# Patient Record
Sex: Female | Born: 1980 | Race: White | Hispanic: No | Smoking: Former smoker
Health system: Southern US, Community
[De-identification: ages and names within clinical notes are randomized; demographics above are authoritative.]

## PROBLEM LIST (undated history)

## (undated) DIAGNOSIS — I10 Essential (primary) hypertension: Secondary | ICD-10-CM

## (undated) DIAGNOSIS — D72829 Elevated white blood cell count, unspecified: Secondary | ICD-10-CM

## (undated) DIAGNOSIS — N200 Calculus of kidney: Secondary | ICD-10-CM

## (undated) DIAGNOSIS — G43909 Migraine, unspecified, not intractable, without status migrainosus: Secondary | ICD-10-CM

## (undated) HISTORY — DX: Migraine, unspecified, not intractable, without status migrainosus: G43.909

## (undated) HISTORY — PX: DILATION AND CURETTAGE OF UTERUS: SHX78

---
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
## 2005-06-25 ENCOUNTER — Ambulatory Visit: Payer: Self-pay

## 2005-06-28 ENCOUNTER — Emergency Department: Payer: Self-pay | Admitting: Unknown Physician Specialty

## 2005-08-10 ENCOUNTER — Emergency Department: Payer: Self-pay | Admitting: Emergency Medicine

## 2005-11-09 ENCOUNTER — Observation Stay: Payer: Self-pay | Admitting: Obstetrics & Gynecology

## 2005-11-10 HISTORY — PX: TUBAL LIGATION: SHX77

## 2006-01-07 ENCOUNTER — Observation Stay: Payer: Self-pay

## 2006-01-08 ENCOUNTER — Ambulatory Visit: Payer: Self-pay

## 2006-01-09 ENCOUNTER — Observation Stay: Payer: Self-pay

## 2006-01-11 ENCOUNTER — Observation Stay: Payer: Self-pay | Admitting: Unknown Physician Specialty

## 2006-01-19 ENCOUNTER — Observation Stay: Payer: Self-pay

## 2006-01-25 ENCOUNTER — Observation Stay: Payer: Self-pay

## 2006-02-10 ENCOUNTER — Emergency Department: Payer: Self-pay | Admitting: Emergency Medicine

## 2006-02-12 ENCOUNTER — Observation Stay: Payer: Self-pay | Admitting: Obstetrics & Gynecology

## 2006-02-16 ENCOUNTER — Observation Stay: Payer: Self-pay

## 2006-02-17 ENCOUNTER — Observation Stay: Payer: Self-pay | Admitting: Unknown Physician Specialty

## 2006-02-20 ENCOUNTER — Observation Stay: Payer: Self-pay

## 2006-02-25 ENCOUNTER — Inpatient Hospital Stay: Payer: Self-pay

## 2006-04-22 ENCOUNTER — Emergency Department: Payer: Self-pay | Admitting: Unknown Physician Specialty

## 2007-10-10 ENCOUNTER — Emergency Department: Payer: Self-pay | Admitting: Emergency Medicine

## 2008-07-21 ENCOUNTER — Ambulatory Visit: Payer: Self-pay | Admitting: Family Medicine

## 2010-06-24 ENCOUNTER — Emergency Department: Payer: Self-pay | Admitting: Unknown Physician Specialty

## 2010-07-17 ENCOUNTER — Ambulatory Visit: Payer: Self-pay | Admitting: Family Medicine

## 2010-08-15 ENCOUNTER — Emergency Department: Payer: Self-pay | Admitting: Unknown Physician Specialty

## 2010-08-16 ENCOUNTER — Ambulatory Visit: Payer: Self-pay | Admitting: Internal Medicine

## 2011-05-06 ENCOUNTER — Emergency Department: Payer: Self-pay | Admitting: Emergency Medicine

## 2011-05-20 ENCOUNTER — Ambulatory Visit: Payer: Self-pay | Admitting: Internal Medicine

## 2011-07-15 ENCOUNTER — Ambulatory Visit: Payer: Self-pay

## 2011-09-18 ENCOUNTER — Emergency Department: Payer: Self-pay | Admitting: *Deleted

## 2012-03-26 ENCOUNTER — Emergency Department: Payer: Self-pay | Admitting: *Deleted

## 2013-02-01 ENCOUNTER — Emergency Department: Payer: Self-pay | Admitting: Emergency Medicine

## 2013-03-16 ENCOUNTER — Ambulatory Visit: Payer: Self-pay | Admitting: Internal Medicine

## 2013-06-21 ENCOUNTER — Emergency Department: Payer: Self-pay | Admitting: Emergency Medicine

## 2014-04-05 ENCOUNTER — Ambulatory Visit: Payer: Self-pay | Admitting: Internal Medicine

## 2014-07-01 ENCOUNTER — Ambulatory Visit: Payer: Self-pay | Admitting: Physician Assistant

## 2014-07-01 ENCOUNTER — Emergency Department: Payer: Self-pay | Admitting: Emergency Medicine

## 2014-07-01 LAB — CBC WITH DIFFERENTIAL/PLATELET
BASOS ABS: 0.1 10*3/uL (ref 0.0–0.1)
Basophil %: 0.5 %
EOS ABS: 0.2 10*3/uL (ref 0.0–0.7)
Eosinophil %: 1.6 %
HCT: 44 % (ref 35.0–47.0)
HGB: 14.7 g/dL (ref 12.0–16.0)
Lymphocyte #: 2.5 10*3/uL (ref 1.0–3.6)
Lymphocyte %: 20.4 %
MCH: 30.3 pg (ref 26.0–34.0)
MCHC: 33.3 g/dL (ref 32.0–36.0)
MCV: 91 fL (ref 80–100)
MONOS PCT: 7.8 %
Monocyte #: 1 x10 3/mm — ABNORMAL HIGH (ref 0.2–0.9)
NEUTROS PCT: 69.7 %
Neutrophil #: 8.6 10*3/uL — ABNORMAL HIGH (ref 1.4–6.5)
PLATELETS: 159 10*3/uL (ref 150–440)
RBC: 4.83 10*6/uL (ref 3.80–5.20)
RDW: 13.7 % (ref 11.5–14.5)
WBC: 12.4 10*3/uL — ABNORMAL HIGH (ref 3.6–11.0)

## 2014-07-01 LAB — URINALYSIS, COMPLETE
BLOOD: NEGATIVE
Bilirubin,UR: NEGATIVE
Glucose,UR: NEGATIVE mg/dL (ref 0–75)
Ketone: NEGATIVE
Nitrite: NEGATIVE
Ph: 6 (ref 4.5–8.0)
Protein: NEGATIVE
RBC, UR: NONE SEEN /HPF (ref 0–5)
SPECIFIC GRAVITY: 1.013 (ref 1.003–1.030)
WBC UR: 2 /HPF (ref 0–5)

## 2014-07-01 LAB — COMPREHENSIVE METABOLIC PANEL
AST: 21 U/L (ref 15–37)
Albumin: 3.5 g/dL (ref 3.4–5.0)
Alkaline Phosphatase: 68 U/L
Anion Gap: 8 (ref 7–16)
BILIRUBIN TOTAL: 0.6 mg/dL (ref 0.2–1.0)
BUN: 9 mg/dL (ref 7–18)
CO2: 24 mmol/L (ref 21–32)
Calcium, Total: 8.9 mg/dL (ref 8.5–10.1)
Chloride: 109 mmol/L — ABNORMAL HIGH (ref 98–107)
Creatinine: 0.82 mg/dL (ref 0.60–1.30)
EGFR (African American): >60
GLUCOSE: 107 mg/dL — AB (ref 65–99)
Osmolality: 280 (ref 275–301)
Potassium: 4 mmol/L (ref 3.5–5.1)
SGPT (ALT): 17 U/L
SODIUM: 141 mmol/L (ref 136–145)
Total Protein: 6.8 g/dL (ref 6.4–8.2)

## 2014-07-01 LAB — GC/CHLAMYDIA PROBE AMP

## 2014-07-01 LAB — WET PREP, GENITAL

## 2014-07-05 ENCOUNTER — Ambulatory Visit: Payer: Self-pay | Admitting: Internal Medicine

## 2014-07-06 ENCOUNTER — Ambulatory Visit: Payer: Self-pay | Admitting: Internal Medicine

## 2014-10-31 ENCOUNTER — Ambulatory Visit: Payer: Self-pay

## 2015-02-01 DIAGNOSIS — R519 Headache, unspecified: Secondary | ICD-10-CM | POA: Insufficient documentation

## 2015-02-01 DIAGNOSIS — R51 Headache: Secondary | ICD-10-CM

## 2015-02-20 ENCOUNTER — Emergency Department
Admit: 2015-02-20 | Disposition: A | Discharge status: Home / Self Care | Payer: Self-pay | Admitting: Emergency Medicine

## 2015-03-03 ENCOUNTER — Emergency Department
Admit: 2015-03-03 | Disposition: A | Discharge status: Home / Self Care | Payer: Self-pay | Admitting: Emergency Medicine

## 2015-03-03 LAB — COMPREHENSIVE METABOLIC PANEL
ALBUMIN: 4.1 g/dL
ALK PHOS: 72 U/L
ANION GAP: 9 (ref 7–16)
AST: 23 U/L
BUN: 11 mg/dL
Bilirubin,Total: 0.4 mg/dL
CHLORIDE: 106 mmol/L
Calcium, Total: 9 mg/dL
Co2: 23 mmol/L
Creatinine: 0.81 mg/dL
EGFR (Non-African Amer.): >60
Glucose: 137 mg/dL — ABNORMAL HIGH
POTASSIUM: 3.6 mmol/L
SGPT (ALT): 16 U/L
SODIUM: 138 mmol/L
TOTAL PROTEIN: 7.2 g/dL

## 2015-03-03 LAB — URINALYSIS, COMPLETE
Bilirubin,UR: NEGATIVE
GLUCOSE, UR: NEGATIVE mg/dL (ref 0–75)
NITRITE: NEGATIVE
Ph: 8 (ref 4.5–8.0)
Protein: NEGATIVE
Specific Gravity: 1.01 (ref 1.003–1.030)

## 2015-03-03 LAB — CBC WITH DIFFERENTIAL/PLATELET
BASOS ABS: 0 10*3/uL (ref 0.0–0.1)
Basophil %: 0.3 %
EOS PCT: 1.1 %
Eosinophil #: 0.2 10*3/uL (ref 0.0–0.7)
HCT: 43.5 % (ref 35.0–47.0)
HGB: 14.7 g/dL (ref 12.0–16.0)
LYMPHS PCT: 14.7 %
Lymphocyte #: 2 10*3/uL (ref 1.0–3.6)
MCH: 29.6 pg (ref 26.0–34.0)
MCHC: 33.7 g/dL (ref 32.0–36.0)
MCV: 88 fL (ref 80–100)
Monocyte #: 0.8 x10 3/mm (ref 0.2–0.9)
Monocyte %: 5.6 %
NEUTROS ABS: 10.7 10*3/uL — AB (ref 1.4–6.5)
NEUTROS PCT: 78.3 %
Platelet: 186 10*3/uL (ref 150–440)
RBC: 4.97 10*6/uL (ref 3.80–5.20)
RDW: 13.8 % (ref 11.5–14.5)
WBC: 13.6 10*3/uL — ABNORMAL HIGH (ref 3.6–11.0)

## 2015-03-03 LAB — TROPONIN I: Troponin-I: <0.03 ng/mL

## 2015-03-03 LAB — LIPASE, BLOOD: Lipase: 30 U/L

## 2015-03-06 ENCOUNTER — Ambulatory Visit
Admit: 2015-03-06 | Disposition: A | Discharge status: Home / Self Care | Payer: Self-pay | Attending: Urology | Admitting: Urology

## 2015-03-08 ENCOUNTER — Ambulatory Visit
Admit: 2015-03-08 | Disposition: A | Discharge status: Home / Self Care | Payer: Self-pay | Attending: Urology | Admitting: Urology

## 2015-03-31 ENCOUNTER — Emergency Department
Admission: EM | Admit: 2015-03-31 | Discharge: 2015-03-31 | Disposition: A | Discharge status: Home / Self Care | Payer: Self-pay | Attending: Emergency Medicine | Admitting: Emergency Medicine

## 2015-03-31 DIAGNOSIS — H6012 Cellulitis of left external ear: Secondary | ICD-10-CM | POA: Insufficient documentation

## 2015-03-31 MED ORDER — AMOXICILLIN-POT CLAVULANATE 200-28.5 MG PO CHEW: 1.0000 | CHEWABLE_TABLET | Freq: Two times a day (BID) | ORAL | Status: AC

## 2015-03-31 MED ORDER — AMOXICILLIN-POT CLAVULANATE 875-125 MG PO TABS
1.0000 | ORAL_TABLET | Freq: Once | ORAL | Status: AC
  Administered 2015-03-31: 1 via ORAL

## 2015-03-31 MED ORDER — AMOXICILLIN-POT CLAVULANATE 875-125 MG PO TABS
ORAL_TABLET | ORAL | Status: AC
  Administered 2015-03-31: 1 via ORAL
  Filled 2015-03-31: qty 1

## 2015-03-31 NOTE — ED Provider Notes (Signed)
CSN: [id number]     Arrival date & time 03/31/15  1922 History   First MD Initiated Contact with Patient 03/31/15 2235     Chief Complaint  Patient presents with  . Foreign Body in Ear     (Consider location/radiation/quality/duration/timing/severity/associated sxs/prior Treatment) HPI   34 year old female underwent industrial piercing to the left ear along the superior pinna 9 days ago. Patient has developed worsening redness, warmth, pain to the left ear pinna. She denies any drainage. She denies any fevers. Pain is moderate and increased with touch. It is sharp and no numbness or tingling. She has tried Tylenol and ibuprofen with minimal pain relief. Patient denies any itching or rash  No past medical history on file. No past surgical history on file. No family history on file. History  Substance Use Topics  . Smoking status: Not on file  . Smokeless tobacco: Not on file  . Alcohol Use: Not on file   OB History    No data available     Review of Systems  Constitutional: Negative for fever, chills, activity change and fatigue.  HENT: Negative for congestion, sinus pressure and sore throat.   Eyes: Negative for visual disturbance.  Respiratory: Negative for cough, chest tightness and shortness of breath.   Cardiovascular: Negative for chest pain and leg swelling.  Gastrointestinal: Negative for nausea, vomiting, abdominal pain and diarrhea.  Genitourinary: Negative for dysuria.  Musculoskeletal: Negative for arthralgias and gait problem.  Skin: Positive for color change. Negative for rash and wound.  Neurological: Negative for weakness, numbness and headaches.  Hematological: Negative for adenopathy.  Psychiatric/Behavioral: Negative for behavioral problems, confusion and agitation.      Allergies  Review of patient's allergies indicates no known allergies.  Home Medications   Prior to Admission medications   Medication Sig Start Date End Date Taking? Authorizing  Provider  amoxicillin-clavulanate (AUGMENTIN) 200-28.5 MG per chewable tablet Chew 1 tablet by mouth 2 (two) times daily. 03/31/15 04/10/15  Evon Slack, PA-C   BP 158/97 mmHg  Pulse 91  Temp(Src) 98 F (36.7 C) (Oral)  Ht 5' (1.524 m)  Wt 166 lb 6.4 oz (75.479 kg)  BMI 32.50 kg/m2  SpO2 99% Physical Exam  Constitutional: She is oriented to person, place, and time. She appears well-developed and well-nourished. No distress.  HENT:  Head: Normocephalic and atraumatic.  Mouth/Throat: Oropharynx is clear and moist.  Eyes: EOM are normal. Pupils are equal, round, and reactive to light. Right eye exhibits no discharge. Left eye exhibits no discharge.  Neck: Normal range of motion. Neck supple.  Cardiovascular: Normal rate, regular rhythm and intact distal pulses.   Pulmonary/Chest: Effort normal and breath sounds normal. No respiratory distress. She exhibits no tenderness.  Abdominal: Soft. She exhibits no distension. There is no tenderness.  Musculoskeletal: Normal range of motion. She exhibits no edema.  Neurological: She is alert and oriented to person, place, and time. She has normal reflexes.  Skin: Skin is warm and dry. There is erythema (to the superior aspect of the left ear along the pinna. There is no drainage noted. Industrial ear ring is intact.no fluctuance is noted.Marland Kitchen).  Psychiatric: She has a normal mood and affect. Her behavior is normal. Thought content normal.    ED Course  Procedures (including critical care time) Labs Review Labs Reviewed - No data to display  Imaging Review No results found.   EKG Interpretation None      MDM   Final diagnoses:  Cellulitis of ear,  left    34 year old female with worsening erythema and warmth to the left superior ear along a recent ear piercing. There is been no drainage noted. Patient will be started with Augmentin. She will follow-up with ENT in 3-5 days if no improvement with antibiotics.    Evon Slackhomas C Janielle Mittelstadt,  PA-C 03/31/15 2255  Evon Slackhomas C Manya Balash, PA-C 03/31/15 2258  Myrna Blazeravid Matthew Schaevitz, MD 04/01/15 [phone]

## 2015-03-31 NOTE — ED Notes (Signed)
Pt had piercing to outer right ear 9 days ago-an "industrial bar"; now with pain and mild swelling to area; tender to touch; concerned of infection

## 2015-03-31 NOTE — Discharge Instructions (Signed)

## 2015-03-31 NOTE — ED Notes (Signed)
Pt reports having industrial piercing to left ear 9 days ago and since then has had intense pain, n/v, and low grade fever.  Left ear visibly swollen.  Pt reports yellow drainage and pain radiating to neck and down jaw to eardrum.  Pt reports being seen by another piercer and told bar was going through vein and pt needs to be seen in ED.  Pt NAD at this time.

## 2015-06-04 ENCOUNTER — Encounter: Payer: Self-pay | Admitting: Internal Medicine

## 2015-06-04 DIAGNOSIS — N200 Calculus of kidney: Secondary | ICD-10-CM

## 2015-06-04 DIAGNOSIS — G43009 Migraine without aura, not intractable, without status migrainosus: Secondary | ICD-10-CM | POA: Insufficient documentation

## 2015-06-04 DIAGNOSIS — N946 Dysmenorrhea, unspecified: Secondary | ICD-10-CM | POA: Insufficient documentation

## 2015-06-04 DIAGNOSIS — Z87442 Personal history of urinary calculi: Secondary | ICD-10-CM | POA: Insufficient documentation

## 2015-06-04 DIAGNOSIS — R030 Elevated blood-pressure reading, without diagnosis of hypertension: Secondary | ICD-10-CM | POA: Insufficient documentation

## 2015-06-05 ENCOUNTER — Ambulatory Visit: Payer: Self-pay | Admitting: Internal Medicine

## 2015-06-25 ENCOUNTER — Other Ambulatory Visit: Payer: Self-pay

## 2015-06-26 MED ORDER — TRAMADOL HCL 50 MG PO TABS: 50.0000 mg | ORAL_TABLET | Freq: Two times a day (BID) | ORAL | Status: DC | PRN

## 2015-07-20 ENCOUNTER — Ambulatory Visit: Payer: Self-pay | Admitting: Internal Medicine

## 2015-08-02 ENCOUNTER — Ambulatory Visit: Payer: Self-pay | Admitting: Internal Medicine

## 2015-08-27 ENCOUNTER — Encounter: Payer: Self-pay | Admitting: Internal Medicine

## 2015-08-27 ENCOUNTER — Ambulatory Visit
Admission: RE | Admit: 2015-08-27 | Discharge: 2015-08-27 | Disposition: A | Discharge status: Home / Self Care | Payer: BLUE CROSS/BLUE SHIELD | Source: Ambulatory Visit | Attending: Internal Medicine | Admitting: Internal Medicine

## 2015-08-27 ENCOUNTER — Ambulatory Visit (INDEPENDENT_AMBULATORY_CARE_PROVIDER_SITE_OTHER): Payer: BLUE CROSS/BLUE SHIELD | Admitting: Internal Medicine

## 2015-08-27 VITALS — BP 120/88 | HR 88 | Ht 60.0 in | Wt 163.0 lb

## 2015-08-27 DIAGNOSIS — Z23 Encounter for immunization: Secondary | ICD-10-CM

## 2015-08-27 DIAGNOSIS — M79671 Pain in right foot: Secondary | ICD-10-CM | POA: Diagnosis not present

## 2015-08-27 MED ORDER — TRAMADOL HCL 50 MG PO TABS: 50.0000 mg | ORAL_TABLET | Freq: Four times a day (QID) | ORAL | Status: DC | PRN

## 2015-08-27 NOTE — Patient Instructions (Signed)
Continue elevation, limited weight bearing and take advil or tramadol as needed for pain every 6 hours.

## 2015-08-27 NOTE — Progress Notes (Signed)
Date:  08/27/2015   Name:  Amy Gomez   DOB:  03/14/1981   MRN:  [id number]   Chief Complaint: Foot Pain Right foot pain - twisted yesterday after tripping over a new puppy.  She did not hear a pop but it hurt immediately.  She propped it up and use ice.  It is very painful - not improving.  Now swollen and warm.  More painful with ambulation.  She is able to move her toes slightly. She has not taken ibuprofen or tramadol that she has for headache.   Review of Systems  Constitutional: Negative for chills and fatigue.  Respiratory: Negative for chest tightness and shortness of breath.   Cardiovascular: Negative for chest pain.  Musculoskeletal: Positive for joint swelling, arthralgias and gait problem.    Patient Active Problem List   Diagnosis Date Noted  . 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 Dysmenorrhea 06/04/2015  . Blood pressure elevated 06/04/2015  . Migraine without aura and responsive to treatment 06/04/2015  . 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 Authorizing Provider  clindamycin (CLEOCIN T) 1 % external solution CLINDAMYCIN PHOSPHATE, 1% (External Solution)  1 (one) application application twice daily to clean, dry face,cheeks and chin for 30 days  Quantity: 30;  Refills: 11   Ordered :30-May-2014  Bari EdwardBERGLUND, Orine Goga M.D.;  Started 30-May-2014 Active 05/30/14  Yes Historical Provider, MD  traMADol (ULTRAM) 50 MG tablet Take 1 tablet (50 mg total) by mouth 2 (two) times daily as needed. 06/26/15   Reubin MilanLaura H Brynleigh Sequeira, MD    Allergies  Allergen Reactions  . Codeine Other (See Comments)  . Sulfa Antibiotics Rash    Past Surgical History  Procedure Laterality Date  . Tubal ligation Bilateral 2007    Social History  Substance Use Topics  . Smoking status: Current Every Day Smoker  . Smokeless tobacco: None  . Alcohol Use: No    Medication list has been reviewed and updated.   Physical  Exam  Constitutional: She appears well-developed and well-nourished. No distress.  Cardiovascular: Normal rate and regular rhythm.   Pulmonary/Chest: Effort normal and breath sounds normal.  Musculoskeletal:       Right ankle: She exhibits decreased range of motion, swelling and ecchymosis. She exhibits normal pulse. Tenderness. No lateral malleolus and no medial malleolus tenderness found.  Nursing note and vitals reviewed.   BP 120/88 mmHg  Pulse 88  Ht 5' (1.524 m)  Wt 163 lb (73.936 kg)  BMI 31.83 kg/m2  Assessment and Plan: 1. Acute foot pain, right Suspect fracture Take ibuprofen or tramadol every 6 hours as needed Elevated and avoid weightbearing as possible - DG Foot Complete Right; Future  2. Need for influenza vaccination - Flu Vaccine QUAD 36+ mos IM   Bari EdwardLaura Moishe Schellenberg, MD [facility]  08/27/2015

## 2015-08-29 ENCOUNTER — Telehealth: Payer: Self-pay | Admitting: Obstetrics and Gynecology

## 2015-08-29 NOTE — Telephone Encounter (Signed)
Called pt we discussed her findings, advised pt that sometimes we can produce more mucous she voiced understanding

## 2015-08-29 NOTE — Telephone Encounter (Signed)
Pt has her tubes tided, she just got off her period a week and half ago and just went to the bathroom and glob of stuff that she said looks like a mucus plug came out. Does she need to be seen. She said she is freaked out.

## 2015-09-26 ENCOUNTER — Telehealth: Payer: Self-pay

## 2015-09-26 MED ORDER — FLUCONAZOLE 100 MG PO TABS: 100.0000 mg | ORAL_TABLET | Freq: Every day | ORAL | Status: DC

## 2015-09-26 NOTE — Telephone Encounter (Signed)
Patient needing Diflucan for yeast infection, antibiotic from dentist, used OTC Monistat did not work. dr

## 2016-09-03 ENCOUNTER — Other Ambulatory Visit: Payer: Self-pay

## 2016-09-03 ENCOUNTER — Other Ambulatory Visit: Payer: Self-pay | Admitting: Internal Medicine

## 2016-09-03 ENCOUNTER — Ambulatory Visit (INDEPENDENT_AMBULATORY_CARE_PROVIDER_SITE_OTHER): Payer: BLUE CROSS/BLUE SHIELD | Admitting: Internal Medicine

## 2016-09-03 ENCOUNTER — Encounter: Payer: Self-pay | Admitting: Internal Medicine

## 2016-09-03 VITALS — BP 128/84 | HR 83 | Resp 16 | Ht 60.0 in | Wt 169.0 lb

## 2016-09-03 DIAGNOSIS — G43009 Migraine without aura, not intractable, without status migrainosus: Secondary | ICD-10-CM | POA: Diagnosis not present

## 2016-09-03 MED ORDER — TOPIRAMATE 25 MG PO TABS: 100.0000 mg | ORAL_TABLET | Freq: Every day | ORAL | 2 refills | Status: DC

## 2016-09-03 NOTE — Patient Instructions (Signed)
Started 25 mg at bedtime for one week, then increase by 25 mg per week until benefit or maximum dose of 100 mg.

## 2016-09-03 NOTE — Progress Notes (Signed)
Date:  09/03/2016   Name:  Amy Gomez   DOB:  06-15-1981   MRN:  [id number]   Chief Complaint: Migraine (Would like to try Topimax again. Had depression in past on them but wants to see if this has changed since there are more family around who can help. Migraines 3 times a week for past 90 days avg. )  Migraine   This is a chronic problem. The problem occurs daily. The problem has been unchanged. The pain is moderate. Associated symptoms include nausea, photophobia and vomiting. Pertinent negatives include no fever. The symptoms are aggravated by menstrual cycle. She has tried NSAIDs (topamax) for the symptoms. Her past medical history is significant for migraine headaches.  She things that stress is contributing to her issues as well.  Currently taking advil and sleeping but this does not completely aleve the headache.   Review of Systems  Constitutional: Negative for diaphoresis and fever.  Eyes: Positive for photophobia.  Respiratory: Negative for chest tightness and shortness of breath.   Cardiovascular: Negative for chest pain, palpitations and leg swelling.  Gastrointestinal: Positive for nausea and vomiting.  Musculoskeletal: Negative for arthralgias.  Skin: Negative for rash.  Neurological: Positive for headaches.  Psychiatric/Behavioral: Positive for dysphoric mood. Negative for sleep disturbance.    Patient Active Problem List   Diagnosis Date Noted  . 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 Dysmenorrhea 06/04/2015  . Blood pressure elevated 06/04/2015  . Migraine without aura and responsive to treatment 06/04/2015  . 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 Authorizing Provider  traMADol (ULTRAM) 50 MG tablet Take 1 tablet (50 mg total) by mouth every 6 (six) hours as needed. Patient not taking: Reported on 09/03/2016 08/27/15   Reubin Milan, MD    Allergies  Allergen Reactions  . Codeine  Other (See Comments)  . Sulfa Antibiotics Rash    Past Surgical History:  Procedure Laterality Date  . TUBAL LIGATION Bilateral 2007    Social History  Substance Use Topics  . Smoking status: Current Every Day Smoker  . Smokeless tobacco: Never Used  . Alcohol use No     Medication list has been reviewed and updated.   Physical Exam  Constitutional: She is oriented to person, place, and time. She appears well-developed. No distress.  HENT:  Head: Normocephalic and atraumatic.  Neck: Normal range of motion. Neck supple. No thyromegaly present.  Cardiovascular: Normal rate, regular rhythm and normal heart sounds.   Pulmonary/Chest: Effort normal and breath sounds normal. No respiratory distress. She has no wheezes.  Musculoskeletal: Normal range of motion. She exhibits no edema or tenderness.  Neurological: She is alert and oriented to person, place, and time.  Skin: Skin is warm and dry. No rash noted.  Psychiatric: She has a normal mood and affect. Her behavior is normal. Thought content normal.  Nursing note and vitals reviewed.   BP 128/84   Pulse 83   Resp 16   Ht 5' (1.524 m)   Wt 169 lb (76.7 kg)   LMP 08/05/2016 (Approximate)   SpO2 98%   BMI 33.01 kg/m   Assessment and Plan: 1. Migraine without aura and responsive to treatment Will resume topamax - titrate up to 100 mg max Follow up if needed - topiramate (TOPAMAX) 25 MG tablet; Take 4 tablets (100 mg total) by mouth at bedtime.  Dispense: 120 tablet; Refill: 2   Bari Edward,  MD [facility]  09/03/2016

## 2016-09-04 ENCOUNTER — Ambulatory Visit (INDEPENDENT_AMBULATORY_CARE_PROVIDER_SITE_OTHER): Payer: BLUE CROSS/BLUE SHIELD

## 2016-09-04 DIAGNOSIS — Z23 Encounter for immunization: Secondary | ICD-10-CM | POA: Diagnosis not present

## 2016-09-29 ENCOUNTER — Ambulatory Visit
Admission: EM | Admit: 2016-09-29 | Discharge: 2016-09-29 | Disposition: A | Discharge status: Home / Self Care | Payer: BLUE CROSS/BLUE SHIELD | Attending: Family Medicine | Admitting: Family Medicine

## 2016-09-29 DIAGNOSIS — L03116 Cellulitis of left lower limb: Secondary | ICD-10-CM | POA: Diagnosis not present

## 2016-09-29 DIAGNOSIS — L02416 Cutaneous abscess of left lower limb: Secondary | ICD-10-CM | POA: Diagnosis not present

## 2016-09-29 MED ORDER — TRAMADOL HCL 50 MG PO TABS: 50.0000 mg | ORAL_TABLET | Freq: Three times a day (TID) | ORAL | 0 refills | Status: DC | PRN

## 2016-09-29 MED ORDER — DOXYCYCLINE HYCLATE 100 MG PO CAPS: 100.0000 mg | ORAL_CAPSULE | Freq: Two times a day (BID) | ORAL | 0 refills | Status: DC

## 2016-09-29 NOTE — ED Provider Notes (Signed)
[facility] ____________________________________________  Time seen: Approximately 0820 AM  I have reviewed the triage vital signs and the nursing notes.   HISTORY  Chief Complaint Cyst   HPI Amy Gomez is a 35 y.o. female presents for complaints of abscess to inner left leg. Patient states that approximately 3 weeks ago she believes that she had a small ingrown hair area and patient states the area that improved. Patient reports the area returned 3 days ago. Patient states that she did stick a needle in the area and drained some purulent fluid out. Patient states since then it has quickly increased in size. Patient reports areas mild to moderately tender. Denies any active drainage. Patient states this morning when she woke up the redness surrounding it had completely increased. Denies any other skin changes, swelling or pain. Denies fevers. Reports continues to eat and drink well. Denies any urinary or bowel changes.  Denies any history of similar. Denies history of MRSA. Patient reports that her daughter did have a history of similar in the past. Patient reports that she is questionably allergic to sulfa. Denies any other antibiotic allergies. Patient reports that she feels well otherwise.  [name], MD: PCP Patient's last menstrual period was 09/21/2016. Denies pregnancy.   Past Medical History:  Diagnosis Date  . Migraine     Patient Active Problem List   Diagnosis Date Noted  . 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 Dysmenorrhea 06/04/2015  . Blood pressure elevated 06/04/2015  . Migraine without aura and responsive to treatment 06/04/2015  . 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 TUBAL LIGATION Bilateral 2007    Current Outpatient Rx  . Order #: [id number] Class: Normal  . Order #: [id number] Class: Normal  . Order #: [id number] Class: Print    No current facility-administered medications for this  encounter.   Current Outpatient Prescriptions:  .  topiramate (TOPAMAX) 25 MG tablet, Take 4 tablets (100 mg total) by mouth at bedtime., Disp: 120 tablet, Rfl: 2 .  doxycycline (VIBRAMYCIN) 100 MG capsule, Take 1 capsule (100 mg total) by mouth 2 (two) times daily., Disp: 20 capsule, Rfl: 0 .  traMADol (ULTRAM) 50 MG tablet, Take 1 tablet (50 mg total) by mouth every 8 (eight) hours as needed (Do not drive or operate machinery while taking as can cause drowsiness.)., Disp: 10 tablet, Rfl: 0  Allergies Codeine and Sulfa antibiotics  Family History  Problem Relation Age of Onset  . Family history unknown: Yes    Social History Social History  Substance Use Topics  . Smoking status: Current Every Day Smoker  . Smokeless tobacco: Never Used  . Alcohol use No    Review of Systems Constitutional: No fever/chills Eyes: No visual changes. ENT: No sore throat. Cardiovascular: Denies chest pain. Respiratory: Denies shortness of breath. Gastrointestinal: No abdominal pain.  No nausea, no vomiting.  No diarrhea.  No constipation. Genitourinary: Negative for dysuria. Musculoskeletal: Negative for back pain. Skin: Negative for rash. As above. Neurological: Negative for headaches, focal weakness or numbness.  10-point ROS otherwise negative.  ____________________________________________   PHYSICAL EXAM:  VITAL SIGNS: ED Triage Vitals  Enc Vitals Group     BP 09/29/16 0819 139/90     Pulse Rate 09/29/16 0819 91     Resp 09/29/16 0819 18     Temp 09/29/16 0819 99.1 F (37.3 C)     Temp Source 09/29/16 0819 Oral     SpO2 09/29/16 0819 97 %  Weight 09/29/16 0818 170 lb (77.1 kg)     Height 09/29/16 0818 5' (1.524 m)     Head Circumference --      Peak Flow --      Pain Score 09/29/16 0819 10     Pain Loc --      Pain Edu? --      Excl. in GC? --     Constitutional: Alert and oriented. Well appearing and in no acute distress. Eyes: Conjunctivae are normal. PERRL.  EOMI. ENT      Head: Normocephalic and atraumatic.      Mouth/Throat: Mucous membranes are moist. Cardiovascular: Normal rate, regular rhythm. Grossly normal heart sounds.  Good peripheral circulation. Respiratory: Normal respiratory effort without tachypnea nor retractions. Breath sounds are clear and equal bilaterally. No wheezes/rales/rhonchi.. Gastrointestinal: Soft and nontender. No distention.  Musculoskeletal:  Ambulatory with steady gait. Bilateral pedal pulses equal and easily palpated.  Neurologic:  Normal speech and language. No gross focal neurologic deficits are appreciated. Speech is normal. No gait instability.  Skin:  Skin is warm, dry and intact. No rash noted. Except: Left inner proximal thigh 3 x 3 cm centered fluctuant abscess with approximately 8 x 8 cm mildly erythematous area surrounding abscess without further induration, no active drainage, mildly tender to palpation, left leg otherwise nontender. Psychiatric: Mood and affect are normal. Speech and behavior are normal. Patient exhibits appropriate insight and judgment   ___________________________________________   LABS (all labs ordered are listed, but only abnormal results are displayed)  Labs Reviewed  AEROBIC CULTURE (SUPERFICIAL SPECIMEN)   ____________________________________________  PROCEDURES Procedures   Procedure(s) performed:  Procedure(s) performed:  Procedure explained and verbal consent obtained. Consent: Verbal consent obtained. Written consent not obtained. Risks and benefits: risks, benefits and alternatives were discussed Patient identity confirmed: verbally with patient and hospital-assigned identification number  Consent given by: patient   I&D abscess Location: Left proximal thigh. Preparation: Patient was prepped and draped in the usual sterile fashion. Anesthesia with 1% Lidocaine 4 mls Irrigation solution: saline and betadine Amount of cleaning: copious Incision made with #11  blade scalpel Moderate purulent drainage immediately obtained with expression. Sterile forceps used to probe and break up loculations.  1/4 " iodoform gauze used and packed.  Patient tolerate well. Wound well approximated post repair.  dressing applied.  Wound care instructions provided.  Observe for any signs of infection or other problems.   ____________________________________________   INITIAL IMPRESSION / ASSESSMENT AND PLAN / ED COURSE  Pertinent labs & imaging results that were available during my care of the patient were reviewed by me and considered in my medical decision making (see chart for details).  Presenting for the complaints of abscess to left inner thigh. Patient denies history of similar in the past. Patient with pointing fluctuant abscess with surrounding cellulitis. Abscess I&D. Patient tolerated well. Packing placed. Patient with questionable sulfa allergy. Will start patient on oral doxycycline and when necessary tramadol. Wound culture obtained. Directed for patient to have follow-up with PCP or urgent care in 2-3 days for packing removal and wound check. Discussed keeping clean and closely monitoring.Discussed indication, risks and benefits of medications with patient.  Discussed follow up with Primary care physician this week. Discussed follow up and return parameters including no resolution or any worsening concerns. Patient verbalized understanding and agreed to plan.   ____________________________________________   FINAL CLINICAL IMPRESSION(S) / ED DIAGNOSES  Final diagnoses:  Cellulitis and abscess of left leg     Discharge Medication  List as of 09/29/2016  8:55 AM    START taking these medications   Details  doxycycline (VIBRAMYCIN) 100 MG capsule Take 1 capsule (100 mg total) by mouth 2 (two) times daily., Starting Mon 09/29/2016, Normal        Note: This dictation was prepared with Dragon dictation along with smaller phrase technology. Any  transcriptional errors that result from this process are unintentional.    Clinical Course       Renford Dills, NP 09/29/16 1013

## 2016-09-29 NOTE — Discharge Instructions (Signed)
Take medication as prescribed. Rest. Drink plenty of fluids. Keep clean. Apply warm compresses.   Follow up with Primary or return to Urgent Care in 2-3 days as discussed.   Follow up with your primary care physician this week as needed. Return to Urgent care for new or worsening concerns.

## 2016-09-29 NOTE — ED Triage Notes (Addendum)
Pt c/o a cyst that came up about 3 weeks ago and she did lance it on her own, however it continues to get bigger and more painful. Inner thigh left leg

## 2016-09-30 ENCOUNTER — Ambulatory Visit
Admission: EM | Admit: 2016-09-30 | Discharge: 2016-09-30 | Disposition: A | Discharge status: Home / Self Care | Payer: BLUE CROSS/BLUE SHIELD | Attending: Family Medicine | Admitting: Family Medicine

## 2016-09-30 ENCOUNTER — Encounter: Payer: Self-pay | Admitting: *Deleted

## 2016-09-30 DIAGNOSIS — L0291 Cutaneous abscess, unspecified: Secondary | ICD-10-CM | POA: Diagnosis not present

## 2016-09-30 MED ORDER — SULFAMETHOXAZOLE-TRIMETHOPRIM 800-160 MG PO TABS: 1.0000 | ORAL_TABLET | Freq: Two times a day (BID) | ORAL | 0 refills | Status: DC

## 2016-09-30 MED ORDER — CEFTRIAXONE SODIUM 1 G IJ SOLR
1.0000 g | Freq: Once | INTRAMUSCULAR | Status: AC
  Administered 2016-09-30: 1 g via INTRAMUSCULAR

## 2016-09-30 NOTE — Discharge Instructions (Signed)
Patient will return to the clinic tomorrow for recheck. Give her 1 g of Rocephin IM tonight and change her from doxycycline to Septra. Inadvertently she was listed as allergic to sulfa drugs .This  was corrected by her grandmother and the patient herself who has used Septra in the past without incident. The cultures and sensitivities were not available tonight and they had to re-incubate in order to get better results according to the note. Patient is going to apply Bactroban to the erythematous area surrounding the wound.

## 2016-09-30 NOTE — ED Triage Notes (Signed)
Seen here yesterday, I&D of abcess to left inner thigh. Pt states site became much more painful and red suddenly this evening. Pt in significant pain.

## 2016-09-30 NOTE — ED Provider Notes (Signed)
CSN: [id number]     Arrival date & time 09/30/16  1918 History   None    Chief Complaint  Patient presents with  . Abscess   (Consider location/radiation/quality/duration/timing/severity/associated sxs/prior Treatment) HPI  This a 35 year old female who was seen yesterday and an I&D of an abscess on the left inner upper thigh was performed. The patient has a packing in place but has noticed significantly more erythematous tender and warm area spreading out from there. He states it is much more painful than before. Cultures and sensitivities were not available tonight but a note was made that they had to reincubate  in order to obtain better growth. Has been taking doxycycline. Evidently there was a inadvertent note that the patient was allergic to Septra but the grandmother and the patient both know that she is able to tolerate Septra.  She has taken it in the past without incident. Has felt feverish but temperature today is 98.2.       Past Medical History:  Diagnosis Date  . Migraine    Past Surgical History:  Procedure Laterality Date  . TUBAL LIGATION Bilateral 2007   Family History  Problem Relation Age of Onset  . Family history unknown: Yes   Social History  Substance Use Topics  . Smoking status: Current Every Day Smoker  . Smokeless tobacco: Never Used  . Alcohol use No   OB History    No data available     Review of Systems  Constitutional: Positive for activity change. Negative for chills, fatigue and fever.  Skin: Positive for color change, rash and wound.  All other systems reviewed and are negative.   Allergies  Patient has no known allergies.  Home Medications   Prior to Admission medications   Medication Sig Start Date End Date Taking? Authorizing Provider  doxycycline (VIBRAMYCIN) 100 MG capsule Take 1 capsule (100 mg total) by mouth 2 (two) times daily. 09/29/16  Yes Renford DillsLindsey Miller, NP  topiramate (TOPAMAX) 25 MG tablet Take 4 tablets (100 mg  total) by mouth at bedtime. 09/03/16  Yes Reubin MilanLaura H Berglund, MD  traMADol (ULTRAM) 50 MG tablet Take 1 tablet (50 mg total) by mouth every 8 (eight) hours as needed (Do not drive or operate machinery while taking as can cause drowsiness.). 09/29/16  Yes Renford DillsLindsey Miller, NP  sulfamethoxazole-trimethoprim (BACTRIM DS,SEPTRA DS) 800-160 MG tablet Take 1 tablet by mouth 2 (two) times daily. 09/30/16   Lutricia FeilWilliam P Roemer, PA-C   Meds Ordered and Administered this Visit   Medications  cefTRIAXone (ROCEPHIN) injection 1 g (1 g Intramuscular Given 09/30/16 2007)    BP 140/87 (BP Location: Left Arm)   Pulse 94   Temp 98.2 F (36.8 C) (Oral)   Resp 16   Ht 5' (1.524 m)   Wt 187 lb (84.8 kg)   LMP 09/21/2016   SpO2 98%   BMI 36.52 kg/m  No data found.   Physical Exam  Constitutional: She is oriented to person, place, and time. She appears well-developed and well-nourished. No distress.  HENT:  Head: Normocephalic and atraumatic.  Eyes: EOM are normal. Pupils are equal, round, and reactive to light.  Neck: Normal range of motion. Neck supple.  Musculoskeletal: Normal range of motion.  Neurological: She is alert and oriented to person, place, and time.  Skin: Skin is warm. She is not diaphoretic. There is erythema.  Examination of the left upper thigh shows a fresh incision with any in place. Some purulence is on the drain.  Measurement of the erythematous area that blanches and is warmth is 9 cm by approximately 11 centimeters. Induration is rather small under the drain measuring 2 x 1 cm. Is mostly superior and lateral.  Nursing note and vitals reviewed.   Urgent Care Course   Clinical Course     Procedures (including critical care time)  Labs Review Labs Reviewed - No data to display  Imaging Review No results found.   Visual Acuity Review  Right Eye Distance:   Left Eye Distance:   Bilateral Distance:    Right Eye Near:   Left Eye Near:    Bilateral Near:      Medications  cefTRIAXone (ROCEPHIN) injection 1 g (1 g Intramuscular Given 09/30/16 2007)      MDM   1. Abscess   The patient was given 1 g of Rocephin tonight ,switch her from doxycycline to Septra since there is no allergy, have her apply Bactroban to the area of erythema. Switched her from adhesive which may be a cause for the erythema to Coban to hold the dressing in place. Have her follow up tomorrow since Thanksgiving is only 2 days away. At that time Evaluation of the erythematous area and checking on cultures and sensitivities as well as removing the drain and possibly repacking if deemed necessary.    Lutricia FeilWilliam P Roemer, PA-C 09/30/16 2029

## 2016-10-01 ENCOUNTER — Ambulatory Visit
Admission: EM | Admit: 2016-10-01 | Discharge: 2016-10-01 | Disposition: A | Discharge status: Home / Self Care | Payer: BLUE CROSS/BLUE SHIELD | Attending: Emergency Medicine | Admitting: Emergency Medicine

## 2016-10-01 ENCOUNTER — Encounter: Payer: Self-pay | Admitting: *Deleted

## 2016-10-01 DIAGNOSIS — Z4800 Encounter for change or removal of nonsurgical wound dressing: Secondary | ICD-10-CM

## 2016-10-01 DIAGNOSIS — Z5189 Encounter for other specified aftercare: Secondary | ICD-10-CM

## 2016-10-01 LAB — AEROBIC CULTURE  (SUPERFICIAL SPECIMEN)

## 2016-10-01 MED ORDER — MUPIROCIN CALCIUM 2 % EX CREA: 1.0000 "application " | TOPICAL_CREAM | Freq: Three times a day (TID) | CUTANEOUS | 0 refills | Status: AC

## 2016-10-01 MED ORDER — HYDROCODONE-ACETAMINOPHEN 5-325 MG PO TABS: 1.0000 | ORAL_TABLET | Freq: Four times a day (QID) | ORAL | 0 refills | Status: AC | PRN

## 2016-10-01 NOTE — ED Provider Notes (Signed)
CSN: [id number]     Arrival date & time 10/01/16  [id number] History   First MD Initiated Contact with Patient 10/01/16 [phone]     Chief Complaint  Patient presents with  . Wound Check  . Nausea  . Chills   (Consider location/radiation/quality/duration/timing/severity/associated sxs/prior Treatment) Patient is a 35 y.o female, who had an I&D of an abscess on her left inner upper thigh 2 days ago, came in yesterday for wound recheck and yesterday her antibiotic was switched from Doxycycline to Septra. In addition, she was given Bactroban yesterday to apply over the erythematous area surrounding the abscess and she was also given 1g of Rocephin yesterday.   Today patient is returning for packing removal and for another wound recheck. Patient reports that she haven't started the Septra yet as it was getting too late last night. Patient also reports that she did not get a prescription for the Bactroban. Patient reports the wound looks the same. She denies fever at home but does have some chills and nausea. Patient reports taking Tramadol for pain, however it is not helping.       Past Medical History:  Diagnosis Date  . Migraine    Past Surgical History:  Procedure Laterality Date  . TUBAL LIGATION Bilateral 2007   Family History  Problem Relation Age of Onset  . Family history unknown: Yes   Social History  Substance Use Topics  . Smoking status: Current Every Day Smoker  . Smokeless tobacco: Never Used  . Alcohol use No   OB History    No data available     Review of Systems  All other systems reviewed and are negative.   Allergies  Patient has no known allergies.  Home Medications   Prior to Admission medications   Medication Sig Start Date End Date Taking? Authorizing Provider  doxycycline (VIBRAMYCIN) 100 MG capsule Take 1 capsule (100 mg total) by mouth 2 (two) times daily. 09/29/16   Renford DillsLindsey Miller, NP  HYDROcodone-acetaminophen (NORCO/VICODIN) 5-325 MG tablet Take 1  tablet by mouth every 6 (six) hours as needed. 10/01/16 10/04/16  Lucia EstelleFeng Steed Kanaan, NP  mupirocin cream (BACTROBAN) 2 % Apply 1 application topically 3 (three) times daily. 10/01/16 10/11/16  Lucia EstelleFeng Tareva Leske, NP  sulfamethoxazole-trimethoprim (BACTRIM DS,SEPTRA DS) 800-160 MG tablet Take 1 tablet by mouth 2 (two) times daily. 09/30/16   Lutricia FeilWilliam P Roemer, PA-C  topiramate (TOPAMAX) 25 MG tablet Take 4 tablets (100 mg total) by mouth at bedtime. 09/03/16   Reubin MilanLaura H Berglund, MD  traMADol (ULTRAM) 50 MG tablet Take 1 tablet (50 mg total) by mouth every 8 (eight) hours as needed (Do not drive or operate machinery while taking as can cause drowsiness.). 09/29/16   Renford DillsLindsey Miller, NP   Meds Ordered and Administered this Visit  Medications - No data to display  BP (!) 130/91 (BP Location: Left Arm)   Pulse 82   Temp 97.7 F (36.5 C) (Oral)   Resp 16   Ht 5' (1.524 m)   Wt 170 lb (77.1 kg)   LMP 09/21/2016   SpO2 98%   BMI 33.20 kg/m  No data found.   Physical Exam  Constitutional: She is oriented to person, place, and time. She appears well-developed and well-nourished.  HENT:  Head: Normocephalic and atraumatic.  Cardiovascular: Normal rate.   Pulmonary/Chest: Effort normal.  Neurological: She is alert and oriented to person, place, and time. Sensory deficit:   Skin:  Left upper leg has a open wound measures 11 mm.  Small amt of serosanguinous drainage noted on the dressing. Packing removed with small amt of purulence drainage on the packing. Wound bed beefy red and appears clean. Still has a good amt of induration surrounding the wound, a large area of erythema noted surrounding the wound that measures 11cm (unchanged from yesterday)  Nursing note and vitals reviewed.   Urgent Care Course   Clinical Course     Procedures (including critical care time)  Labs Review Labs Reviewed - No data to display  Imaging Review No results found.   MDM   1. Wound check, abscess    1) Culture and  sensitivity still not back yet 2) Informed to get the Septra fill and start Septra today 3) Rx for Bactroban given as it was not given yesterday 4) Packing removed, wound cleaned. I don't suspect that the erythema surrounding the wound is due to previous adhesive; patient doesn't think so as well, therefore a 4x4 island dressing applied.  5) Keep the wound clean, Clean wound once daily with new dressing afterward 6) Call Friday to find out about your culture and sensitivity 7) F/u with Dr. Judithann GravesBerglund on Monday for wound re-evaluation 8) Hydrocodone-APAP given for pain; [place] Controlled substances reviewed and is appropriate.    Lucia EstelleFeng Kursten Kruk, NP 10/01/16 [phone]

## 2016-10-01 NOTE — Discharge Instructions (Signed)
Start Septra today Start the Bactroban Cream today Dressing change daily; keep wound clean Call back on Friday to find out about your wound culture result; hopefully it will be back then Follow up with Dr. Judithann GravesBerglund on Monday.

## 2016-10-01 NOTE — ED Triage Notes (Signed)
Pt seen here past 2 days for abcess to left inner thigh which was I&D'd Monday. Pt here for wound check. Pt feels the pain is worse and now has nausea and chills.

## 2016-10-05 ENCOUNTER — Telehealth: Payer: Self-pay | Admitting: *Deleted

## 2016-10-05 NOTE — Telephone Encounter (Signed)
Called patient, verified DOB, communicated positive wound culture results. Patient reported that the wound is healing well. Advised patient to follow up with PCP or [facility] if symptoms return

## 2016-10-06 ENCOUNTER — Ambulatory Visit (INDEPENDENT_AMBULATORY_CARE_PROVIDER_SITE_OTHER): Payer: BLUE CROSS/BLUE SHIELD | Admitting: Internal Medicine

## 2016-10-06 ENCOUNTER — Encounter: Payer: Self-pay | Admitting: Internal Medicine

## 2016-10-06 VITALS — BP 136/86 | HR 94 | Temp 99.9°F | Resp 16 | Ht 60.0 in | Wt 169.0 lb

## 2016-10-06 DIAGNOSIS — R112 Nausea with vomiting, unspecified: Secondary | ICD-10-CM | POA: Diagnosis not present

## 2016-10-06 DIAGNOSIS — H6983 Other specified disorders of Eustachian tube, bilateral: Secondary | ICD-10-CM

## 2016-10-06 DIAGNOSIS — L0291 Cutaneous abscess, unspecified: Secondary | ICD-10-CM

## 2016-10-06 MED ORDER — ONDANSETRON HCL 4 MG PO TABS: 4.0000 mg | ORAL_TABLET | Freq: Three times a day (TID) | ORAL | 0 refills | Status: DC | PRN

## 2016-10-06 NOTE — Progress Notes (Signed)
Date:  10/06/2016   Name:  Amy Gomez   DOB:  09/11/1981   MRN:  [id number]   Chief Complaint: Wound Check (LOA for 3 days fever yesterday after getting cyst cut on inner of left thigh positive for a bacteria. ) [facility]. Initially had I&D of abscess of thigh.  Returned 2 more times for packing removal and dressing change.  Last seen 5 days ago and therapy changed to Bactrim.  Wound cx positive for rare coag neg staph. Wound Check  She was originally treated 10 to 14 days ago. Previous treatment included oral antibiotics and I&D of abscess. There has been no drainage from the wound. The redness has improved. The swelling has improved. The pain has improved.  Otalgia   There is pain in the left ear. This is a new problem. The current episode started yesterday. The maximum temperature recorded prior to her arrival was 100.4 - 100.9 F. Associated symptoms include vomiting. Pertinent negatives include no diarrhea or ear discharge. She has tried antibiotics for the symptoms.     Review of Systems  Constitutional: Negative for diaphoresis and unexpected weight change.  HENT: Positive for ear pain. Negative for ear discharge.   Respiratory: Negative for chest tightness and shortness of breath.   Cardiovascular: Negative for chest pain.  Gastrointestinal: Positive for vomiting. Negative for diarrhea.  Skin: Positive for wound.    Patient Active Problem List   Diagnosis Date Noted  . 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 Dysmenorrhea 06/04/2015  . Blood pressure elevated 06/04/2015  . Migraine without aura and responsive to treatment 06/04/2015  . 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 Authorizing Provider  mupirocin cream (BACTROBAN) 2 % Apply 1 application topically 3 (three) times daily. 10/01/16 10/11/16  Lucia EstelleFeng Zheng, NP  sulfamethoxazole-trimethoprim (BACTRIM DS,SEPTRA DS) 800-160 MG  tablet Take 1 tablet by mouth 2 (two) times daily. 09/30/16   Lutricia FeilWilliam P Roemer, PA-C  topiramate (TOPAMAX) 25 MG tablet Take 4 tablets (100 mg total) by mouth at bedtime. 09/03/16   Reubin MilanLaura H Lyllie Cobbins, MD  traMADol (ULTRAM) 50 MG tablet Take 1 tablet (50 mg total) by mouth every 8 (eight) hours as needed (Do not drive or operate machinery while taking as can cause drowsiness.). 09/29/16   Renford DillsLindsey Miller, NP    No Known Allergies  Past Surgical History:  Procedure Laterality Date  . TUBAL LIGATION Bilateral 2007    Social History  Substance Use Topics  . Smoking status: Current Every Day Smoker  . Smokeless tobacco: Never Used  . Alcohol use No     Medication list has been reviewed and updated.   Physical Exam  Constitutional: She is oriented to person, place, and time. She appears well-developed. No distress.  HENT:  Head: Normocephalic and atraumatic.  Right Ear: Tympanic membrane and ear canal normal.  Left Ear: Ear canal normal. Tympanic membrane is retracted.  Nose: Right sinus exhibits no maxillary sinus tenderness. Left sinus exhibits no maxillary sinus tenderness.  Mouth/Throat: Oropharynx is clear and moist.  Cardiovascular: Normal rate, regular rhythm and normal heart sounds.   Pulmonary/Chest: Effort normal and breath sounds normal. No respiratory distress.  Musculoskeletal: Normal range of motion.  Neurological: She is alert and oriented to person, place, and time.  Skin:  Inner left thigh - small area of induration adjacent to the abscess site. Skin normal - no erythema.  No drainage from wound.  Minimal tenderness.  Psychiatric: She has a normal mood and affect. Her behavior is normal. Thought content normal.  Nursing note and vitals reviewed.   BP (!) 142/98   Pulse 94   Temp 99.9 F (37.7 C) (Oral)   Resp 16   Ht 5' (1.524 m)   Wt 169 lb (76.7 kg)   LMP 09/21/2016   SpO2 98%   BMI 33.01 kg/m   Assessment and Plan: 1. Abscess Almost resolved -  continue mupirocin topically until wound closed  2. Acute dysfunction of Eustachian tube, bilateral Sudafed 60 mg tid  3. Non-intractable vomiting with nausea, unspecified vomiting type Clear fluids until resolved - ondansetron (ZOFRAN) 4 MG tablet; Take 1 tablet (4 mg total) by mouth every 8 (eight) hours as needed for nausea or vomiting.  Dispense: 20 tablet; Refill: 0   Bari EdwardLaura Darrik Richman, MD [facility]  10/06/2016

## 2016-12-18 ENCOUNTER — Ambulatory Visit (INDEPENDENT_AMBULATORY_CARE_PROVIDER_SITE_OTHER): Payer: Self-pay | Admitting: Internal Medicine

## 2016-12-18 ENCOUNTER — Encounter: Payer: Self-pay | Admitting: Internal Medicine

## 2016-12-18 VITALS — BP 138/86 | HR 101 | Temp 98.1°F | Ht 60.0 in | Wt 173.0 lb

## 2016-12-18 DIAGNOSIS — R1011 Right upper quadrant pain: Secondary | ICD-10-CM

## 2016-12-18 DIAGNOSIS — K219 Gastro-esophageal reflux disease without esophagitis: Secondary | ICD-10-CM

## 2016-12-18 MED ORDER — RANITIDINE HCL 150 MG PO TABS: 150.0000 mg | ORAL_TABLET | Freq: Two times a day (BID) | ORAL | 2 refills | Status: DC

## 2016-12-18 NOTE — Progress Notes (Signed)
    Date:  12/18/2016   Name:  Amy Gomez   DOB:  10/10/1981   MRN:  [id number]   Chief Complaint: Abdominal Pain (Pt stated having stomach pain, loose bowel movement clay color  for about 1 month) Abdominal Pain  This is a new problem. The current episode started 1 to 4 weeks ago. The onset quality is gradual. The problem occurs 2 to 4 times per day. The pain is located in the RUQ. The pain is mild. The quality of the pain is colicky. The abdominal pain radiates to the back. Associated symptoms include diarrhea (loose stools) and nausea. Pertinent negatives include no constipation, dysuria, fever, frequency, headaches or weight loss. Associated symptoms comments: And clay colored stool yesterday. The pain is aggravated by eating. She has tried nothing for the symptoms.  She has been eating more fatty foods due to dental issues.  Also have more heartburn, especially at night.   Review of Systems  Constitutional: Positive for chills. Negative for fatigue, fever, unexpected weight change and weight loss.  Respiratory: Negative for cough, chest tightness and shortness of breath.   Cardiovascular: Negative for chest pain, palpitations and leg swelling.  Gastrointestinal: Positive for abdominal pain, diarrhea (loose stools) and nausea. Negative for blood in stool and constipation.  Genitourinary: Negative for dysuria and frequency.  Neurological: Negative for dizziness and headaches.    Patient Active Problem List   Diagnosis Date Noted  . 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 Dysmenorrhea 06/04/2015  . Blood pressure elevated 06/04/2015  . Migraine without aura and responsive to treatment 06/04/2015  . 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 TUBAL LIGATION Bilateral 2007    Social History  Substance Use Topics  . Smoking status: Current Every Day Smoker  . Smokeless  tobacco: Never Used  . Alcohol use No     Medication list has been reviewed and updated.   Physical Exam  Constitutional: She is oriented to person, place, and time. She appears well-developed. No distress.  HENT:  Head: Normocephalic and atraumatic.  Neck: Normal range of motion. No thyromegaly present.  Cardiovascular: Normal rate, regular rhythm and normal heart sounds.   Pulmonary/Chest: Effort normal and breath sounds normal. No respiratory distress. She has no wheezes.  Abdominal: Soft. Normal appearance and bowel sounds are normal. There is tenderness in the right upper quadrant. There is guarding. There is no rigidity and no CVA tenderness.  Musculoskeletal: Normal range of motion.  Neurological: She is alert and oriented to person, place, and time.  Skin: Skin is warm and dry. No rash noted.  Psychiatric: She has a normal mood and affect. Her behavior is normal. Thought content normal.  Nursing note and vitals reviewed.   BP 138/86   Pulse (!) 101   Temp 98.1 F (36.7 C)   Ht 5' (1.524 m)   Wt 173 lb (78.5 kg)   SpO2 98%   BMI 33.79 kg/m   Assessment and Plan: 1. RUQ abdominal pain Rule out gall bladder disease Low fat diet - US Abdomen Limited RUQ; Future  2. Gastroesophageal reflux disease without esophagitis - ranitidine (ZANTAC) 150 MG tablet; Take 1 tablet (150 mg total) by mouth 2 (two) times daily.  Dispense: 60 tablet; Refill: 2   Bari EdwardLaura Maris Bena, MD [facility]  12/18/2016

## 2016-12-19 ENCOUNTER — Ambulatory Visit: Payer: BLUE CROSS/BLUE SHIELD

## 2016-12-23 ENCOUNTER — Other Ambulatory Visit: Payer: Self-pay | Admitting: Internal Medicine

## 2016-12-23 ENCOUNTER — Ambulatory Visit
Admission: RE | Admit: 2016-12-23 | Discharge: 2016-12-23 | Disposition: A | Discharge status: Home / Self Care | Payer: Managed Care, Other (non HMO) | Source: Ambulatory Visit | Attending: Internal Medicine | Admitting: Internal Medicine

## 2016-12-23 DIAGNOSIS — K769 Liver disease, unspecified: Secondary | ICD-10-CM | POA: Diagnosis not present

## 2016-12-23 DIAGNOSIS — R1011 Right upper quadrant pain: Secondary | ICD-10-CM

## 2016-12-23 NOTE — Progress Notes (Signed)
Spoke to pt and she is requesting to go ahead and get the referral for GI. She said abdominal pain has gotten worse.

## 2017-01-19 ENCOUNTER — Ambulatory Visit: Payer: BLUE CROSS/BLUE SHIELD

## 2017-01-21 ENCOUNTER — Ambulatory Visit (INDEPENDENT_AMBULATORY_CARE_PROVIDER_SITE_OTHER): Payer: Managed Care, Other (non HMO) | Admitting: Gastroenterology

## 2017-01-21 ENCOUNTER — Encounter: Payer: Self-pay | Admitting: Gastroenterology

## 2017-01-21 ENCOUNTER — Ambulatory Visit: Payer: Self-pay | Admitting: Gastroenterology

## 2017-01-21 VITALS — BP 154/99 | HR 81 | Temp 98.1°F | Ht 60.0 in | Wt 177.0 lb

## 2017-01-21 DIAGNOSIS — R194 Change in bowel habit: Secondary | ICD-10-CM

## 2017-01-21 DIAGNOSIS — R109 Unspecified abdominal pain: Secondary | ICD-10-CM

## 2017-01-21 NOTE — Progress Notes (Signed)
Gastroenterology Consultation  Referring Provider:     Reubin MilanBerglund, Laura H, MD Primary Care Physician:  [name], MD Primary Gastroenterologist:  Dr. Servando SnareWohl     Reason for Consultation:     Change in bowel habits and right-sided abdominal pain        HPI:   Amy Gomez is a 36 y.o. y/o female referred for consultation & management of Change in bowel habits and right-sided abdominal pain by Dr. [name], MD.  This patient comes in with a report of abdominal pain in the right side that has been present for approximately 3 years. The patient states that it is a burning and crampy pain in the right side that can happen when she is walking and then she feels that area and it is hard to the touch. She also reports that she has to massage it to make it feel better. There is no report of any unexplained weight loss and she states that she has been gaining weight. There is no report of any black stools or bloody stools. She does report that for the last 3 months she has been having some loose bowel movements. She states that it does not wake her up at night but she feels that she gets up earlier in the morning because she has to move her bowels. The patient thinks it may be related to the coffee she is drinking. There is no report of any family history of colon cancer colon polyps because the patient states she does not know much of her father's side of the family. She also does not know if his any Crohn's or ulcerative colitis history in her family. The patient states that her right-sided abdominal pain has also migrated to her back.  Past Medical History:  Diagnosis Date  . Migraine     Past Surgical History:  Procedure Laterality Date  . TUBAL LIGATION Bilateral 2007    Prior to Admission medications   Medication Sig Start Date End Date Taking? Authorizing Provider  ranitidine (ZANTAC) 150 MG tablet Take 1 tablet (150 mg total) by mouth 2 (two) times daily. Patient not taking:  Reported on 01/21/2017 12/18/16   Reubin MilanLaura H Berglund, MD    Family History  Problem Relation Age of Onset  . Family history unknown: Yes     Social History  Substance Use Topics  . Smoking status: Current Every Day Smoker  . Smokeless tobacco: Never Used  . Alcohol use No    Allergies as of 01/21/2017  . (No Known Allergies)    Review of Systems:    All systems reviewed and negative except where noted in HPI.   Physical Exam:  BP (!) 154/99   Pulse 81   Temp 98.1 F (36.7 C) (Oral)   Ht 5' (1.524 m)   Wt 177 lb (80.3 kg)   BMI 34.57 kg/m  No LMP recorded. Psych:  Alert and cooperative. Normal mood and affect. General:   Alert,  Well-developed, well-nourished, pleasant and cooperative in NAD Head:  Normocephalic and atraumatic. Eyes:  Sclera clear, no icterus.   Conjunctiva pink. Ears:  Normal auditory acuity. Nose:  No deformity, discharge, or lesions. Mouth:  No deformity or lesions,oropharynx pink & moist. Neck:  Supple; no masses or thyromegaly. Lungs:  Respirations even and unlabored.  Clear throughout to auscultation.   No wheezes, crackles, or rhonchi. No acute distress. Heart:  Regular rate and rhythm; no murmurs, clicks, rubs, or gallops. Abdomen:  Normal bowel  sounds.  No bruits.  Soft, Tenderness to 1 finger palpation while flexing the abdominal wall muscles by raising the leg 6 inches above the exam table non-distended without masses, hepatosplenomegaly or hernias noted.  No guarding or rebound tenderness.  Positive Carnett sign.   Rectal:  Deferred.  Msk:  Symmetrical without gross deformities.  Good, equal movement & strength bilaterally. Pulses:  Normal pulses noted. Extremities:  No clubbing or edema.  No cyanosis. Neurologic:  Alert and oriented x3;  grossly normal neurologically. Skin:  Intact without significant lesions or rashes.  No jaundice. Lymph Nodes:  No significant cervical adenopathy. Psych:  Alert and cooperative. Normal mood and  affect.  Imaging Studies: US Abdomen Limited Ruq  Result Date: 12/23/2016 CLINICAL DATA:  36 year old female with right upper quadrant pain after eating for the past month. Initial encounter. EXAM: US ABDOMEN LIMITED - RIGHT UPPER QUADRANT COMPARISON:  None. FINDINGS: Gallbladder: No gallstones or wall thickening visualized. No sonographic Murphy sign noted by sonographer. Common bile duct: Diameter: 2.1 mm Liver: 3.2 x 2.7 x 3.3 cm lesion within the right lobe of the liver. 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 IMPRESSION: No ultrasound evidence of cholecystitis.  No gallstones noted. 3.2 x 2.7 x 3.3 cm lesion within the right lobe of the liver. 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 Electronically Signed   By: Lacy Duverney M.D.   On: 12/23/2016 08:43    Assessment and Plan:   Keala Serrita Lueth is a 36 y.o. y/o female who comes in today with a history of right-sided abdominal pain with an ultrasound not showing any cause for her abdominal pain. The patient also reports the pain to be a burning crampy pain that is hard to the touch when she has it. This is consistent with musculoskeletal spasms in her rectus abdominis muscles. The patient is also reproducible with a straight leg raising off the exam table with 1 finger palpation in addition to worsening of the pain without palpated the abdominal wall just by raising the legs. The patient's change in bowel habits of looser stools for the last 3 months should be investigated with a colonoscopy. The patient has refused that at this time and states she would rather try to change her diet to see if there is any improvement with that. She has said that if it does not improve she will contact me for a colonoscopy. Patient has been explained the plan and agrees with it.    Midge Minium, MD. Clementeen Graham   Note: This  dictation was prepared with Dragon dictation along with smaller phrase technology. Any transcriptional errors that result from this process are unintentional.

## 2017-02-23 ENCOUNTER — Other Ambulatory Visit: Payer: Self-pay | Admitting: Internal Medicine

## 2017-02-23 ENCOUNTER — Telehealth: Payer: Self-pay | Admitting: Internal Medicine

## 2017-02-23 MED ORDER — TRAMADOL HCL 50 MG PO TABS: 50.0000 mg | ORAL_TABLET | Freq: Three times a day (TID) | ORAL | 0 refills | Status: DC | PRN

## 2017-02-23 NOTE — Telephone Encounter (Signed)
Patient called wanting refill on Tramadol , does not know the mg  [facility] [place]  If we cannot fill, please call patient to make appt.

## 2017-03-20 ENCOUNTER — Encounter: Payer: Self-pay | Admitting: Internal Medicine

## 2017-03-20 ENCOUNTER — Ambulatory Visit (INDEPENDENT_AMBULATORY_CARE_PROVIDER_SITE_OTHER): Payer: Managed Care, Other (non HMO) | Admitting: Internal Medicine

## 2017-03-20 VITALS — BP 142/100 | HR 91 | Ht 60.0 in | Wt 172.0 lb

## 2017-03-20 DIAGNOSIS — R42 Dizziness and giddiness: Secondary | ICD-10-CM | POA: Diagnosis not present

## 2017-03-20 DIAGNOSIS — R030 Elevated blood-pressure reading, without diagnosis of hypertension: Secondary | ICD-10-CM | POA: Diagnosis not present

## 2017-03-20 DIAGNOSIS — G43009 Migraine without aura, not intractable, without status migrainosus: Secondary | ICD-10-CM

## 2017-03-20 MED ORDER — MECLIZINE HCL 25 MG PO TABS: 25.0000 mg | ORAL_TABLET | Freq: Three times a day (TID) | ORAL | 0 refills | Status: DC | PRN

## 2017-03-20 NOTE — Progress Notes (Signed)
Date:  03/20/2017   Name:  Amy Gomez   DOB:  10/30/1981   MRN:  [id number]   Chief Complaint: Hypertension (X 2 days BP has been high,. Been dizzy, couldn't stand up straight and felt nausea. BP was 200/110 and BS was 210. Has not felt good since then. This morning 147/97. ) Hypertension  This is a new problem. The current episode started 1 to 4 weeks ago. The problem is unchanged. The problem is uncontrolled. Pertinent negatives include no chest pain, palpitations or shortness of breath.  Dizziness  This is a new problem. The current episode started in the past 7 days. The problem occurs intermittently. Associated symptoms include fatigue and nausea. Pertinent negatives include no abdominal pain, chest pain, chills or fever.  Migraine   This is a recurrent problem. The problem occurs daily (over the past 2 weeks). Associated symptoms include dizziness and nausea. Pertinent negatives include no abdominal pain, ear pain or fever. Her past medical history is significant for hypertension.  All her sx over the past two days have been migraine HA, nausea, dizziness and vomiting.  She is drinking fluids and eating.  No fever or chills.  No sinus sx but some ear fullness.  Still smoking cigarettes.    Review of Systems  Constitutional: Positive for fatigue. Negative for chills and fever.  HENT: Negative for ear pain.   Eyes: Positive for visual disturbance (ocular migraine).  Respiratory: Negative for chest tightness, shortness of breath and wheezing.   Cardiovascular: Negative for chest pain, palpitations and leg swelling.  Gastrointestinal: Positive for diarrhea and nausea. Negative for abdominal pain and blood in stool.  Genitourinary: Negative for dysuria.  Neurological: Positive for dizziness and light-headedness. Negative for tremors and syncope.    Patient Active Problem List   Diagnosis Date Noted  . Gastroesophageal reflux disease without esophagitis 12/18/2016  . RUQ  abdominal pain 12/18/2016  . 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 Dysmenorrhea 06/04/2015  . Blood pressure elevated 06/04/2015  . Migraine without aura and responsive to treatment 06/04/2015  . Headache 02/01/2015  . 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 Authorizing Provider  traMADol (ULTRAM) 50 MG tablet Take 1 tablet (50 mg total) by mouth every 8 (eight) hours as needed (Do not drive or operate machinery while taking as can cause drowsiness.). 02/23/17  Yes Reubin MilanBerglund, Connee Ikner H, MD    No Known Allergies  Past Surgical History:  Procedure Laterality Date  . TUBAL LIGATION Bilateral 2007    Social History  Substance Use Topics  . Smoking status: Current Every Day Smoker  . Smokeless tobacco: Never Used  . Alcohol use No     Medication list has been reviewed and updated.   Physical Exam  Constitutional: She is oriented to person, place, and time. She appears well-developed. She appears distressed.  HENT:  Head: Normocephalic and atraumatic.  Right Ear: Tympanic membrane is retracted.  Left Ear: Tympanic membrane is retracted.  Nose: Right sinus exhibits no maxillary sinus tenderness. Left sinus exhibits no maxillary sinus tenderness.  Mouth/Throat: No posterior oropharyngeal erythema.  Eyes: Conjunctivae are normal. Pupils are equal, round, and reactive to light. Right eye exhibits nystagmus. Left eye exhibits nystagmus.  Neck: No tracheal tenderness present. No thyromegaly present.  Cardiovascular: Normal rate, regular rhythm and normal heart sounds.   Pulmonary/Chest: Effort normal and breath sounds normal. No respiratory distress. She has no wheezes.  Musculoskeletal:  Normal range of motion.  Neurological: She is alert and oriented to person, place, and time.  Skin: Skin is warm and dry. No rash noted.  Psychiatric: She has a normal mood and affect. Her behavior is normal. Thought content normal.    Nursing note and vitals reviewed.   BP (!) 134/94 (BP Location: Left Arm, Patient Position: Sitting, Cuff Size: Normal)   Pulse 91   Ht 5' (1.524 m)   Wt 172 lb (78 kg)   LMP 03/20/2017 (Exact Date)   SpO2 97%   BMI 33.59 kg/m   Assessment and Plan: 1. Migraine without aura and responsive to treatment Continue Aleve or Tramadol as needed  2. Vertigo Viral etiology suspected - treat with meclizine Sample of Nasocort for ear congestion  3. Elevated blood pressure reading Situational but needs follow up Grandmother will check BP several times per week once current illness resolved and call if persistently elevated - Comprehensive metabolic panel - TSH   Meds ordered this encounter  Medications  . meclizine (ANTIVERT) 25 MG tablet    Sig: Take 1 tablet (25 mg total) by mouth 3 (three) times daily as needed for dizziness.    Dispense:  30 tablet    Refill:  0    Bari Edward, MD [facility]  03/20/2017

## 2017-03-20 NOTE — Patient Instructions (Signed)
DASH Eating Plan DASH stands for "Dietary Approaches to Stop Hypertension." The DASH eating plan is a healthy eating plan that has been shown to reduce high blood pressure (hypertension). It may also reduce your risk for type 2 diabetes, heart disease, and stroke. The DASH eating plan may also help with weight loss. What are tips for following this plan? General guidelines  Avoid eating more than 2,300 mg (milligrams) of salt (sodium) a day. If you have hypertension, you may need to reduce your sodium intake to 1,500 mg a day.  Limit alcohol intake to no more than 1 drink a day for nonpregnant women and 2 drinks a day for men. One drink equals 12 oz of beer, 5 oz of wine, or 1 oz of hard liquor.  Work with your health care provider to maintain a healthy body weight or to lose weight. Ask what an ideal weight is for you.  Get at least 30 minutes of exercise that causes your heart to beat faster (aerobic exercise) most days of the week. Activities may include walking, swimming, or biking.  Work with your health care provider or diet and nutrition specialist (dietitian) to adjust your eating plan to your individual calorie needs. Reading food labels  Check food labels for the amount of sodium per serving. Choose foods with less than 5 percent of the Daily Value of sodium. Generally, foods with less than 300 mg of sodium per serving fit into this eating plan.  To find whole grains, look for the word "whole" as the first word in the ingredient list. Shopping  Buy products labeled as "low-sodium" or "no salt added."  Buy fresh foods. Avoid canned foods and premade or frozen meals. Cooking  Avoid adding salt when cooking. Use salt-free seasonings or herbs instead of table salt or sea salt. Check with your health care provider or pharmacist before using salt substitutes.  Do not fry foods. Cook foods using healthy methods such as baking, boiling, grilling, and broiling instead.  Cook with  heart-healthy oils, such as olive, canola, soybean, or sunflower oil. Meal planning   Eat a balanced diet that includes: ? 5 or more servings of fruits and vegetables each day. At each meal, try to fill half of your plate with fruits and vegetables. ? Up to 6-8 servings of whole grains each day. ? Less than 6 oz of lean meat, poultry, or fish each day. A 3-oz serving of meat is about the same size as a deck of cards. One egg equals 1 oz. ? 2 servings of low-fat dairy each day. ? A serving of nuts, seeds, or beans 5 times each week. ? Heart-healthy fats. Healthy fats called Omega-3 fatty acids are found in foods such as flaxseeds and coldwater fish, like sardines, salmon, and mackerel.  Limit how much you eat of the following: ? Canned or prepackaged foods. ? Food that is high in trans fat, such as fried foods. ? Food that is high in saturated fat, such as fatty meat. ? Sweets, desserts, sugary drinks, and other foods with added sugar. ? Full-fat dairy products.  Do not salt foods before eating.  Try to eat at least 2 vegetarian meals each week.  Eat more home-cooked food and less restaurant, buffet, and fast food.  When eating at a restaurant, ask that your food be prepared with less salt or no salt, if possible. What foods are recommended? The items listed may not be a complete list. Talk with your dietitian about what   dietary choices are best for you. Grains Whole-grain or whole-wheat bread. Whole-grain or whole-wheat pasta. Brown rice. Oatmeal. Quinoa. Bulgur. Whole-grain and low-sodium cereals. Pita bread. Low-fat, low-sodium crackers. Whole-wheat flour tortillas. Vegetables Fresh or frozen vegetables (raw, steamed, roasted, or grilled). Low-sodium or reduced-sodium tomato and vegetable juice. Low-sodium or reduced-sodium tomato sauce and tomato paste. Low-sodium or reduced-sodium canned vegetables. Fruits All fresh, dried, or frozen fruit. Canned fruit in natural juice (without  added sugar). Meat and other protein foods Skinless chicken or turkey. Ground chicken or turkey. Pork with fat trimmed off. Fish and seafood. Egg whites. Dried beans, peas, or lentils. Unsalted nuts, nut butters, and seeds. Unsalted canned beans. Lean cuts of beef with fat trimmed off. Low-sodium, lean deli meat. Dairy Low-fat (1%) or fat-free (skim) milk. Fat-free, low-fat, or reduced-fat cheeses. Nonfat, low-sodium ricotta or cottage cheese. Low-fat or nonfat yogurt. Low-fat, low-sodium cheese. Fats and oils Soft margarine without trans fats. Vegetable oil. Low-fat, reduced-fat, or light mayonnaise and salad dressings (reduced-sodium). Canola, safflower, olive, soybean, and sunflower oils. Avocado. Seasoning and other foods Herbs. Spices. Seasoning mixes without salt. Unsalted popcorn and pretzels. Fat-free sweets. What foods are not recommended? The items listed may not be a complete list. Talk with your dietitian about what dietary choices are best for you. Grains Baked goods made with fat, such as croissants, muffins, or some breads. Dry pasta or rice meal packs. Vegetables Creamed or fried vegetables. Vegetables in a cheese sauce. Regular canned vegetables (not low-sodium or reduced-sodium). Regular canned tomato sauce and paste (not low-sodium or reduced-sodium). Regular tomato and vegetable juice (not low-sodium or reduced-sodium). Pickles. Olives. Fruits Canned fruit in a light or heavy syrup. Fried fruit. Fruit in cream or butter sauce. Meat and other protein foods Fatty cuts of meat. Ribs. Fried meat. Bacon. Sausage. Bologna and other processed lunch meats. Salami. Fatback. Hotdogs. Bratwurst. Salted nuts and seeds. Canned beans with added salt. Canned or smoked fish. Whole eggs or egg yolks. Chicken or turkey with skin. Dairy Whole or 2% milk, cream, and half-and-half. Whole or full-fat cream cheese. Whole-fat or sweetened yogurt. Full-fat cheese. Nondairy creamers. Whipped toppings.  Processed cheese and cheese spreads. Fats and oils Butter. Stick margarine. Lard. Shortening. Ghee. Bacon fat. Tropical oils, such as coconut, palm kernel, or palm oil. Seasoning and other foods Salted popcorn and pretzels. Onion salt, garlic salt, seasoned salt, table salt, and sea salt. Worcestershire sauce. Tartar sauce. Barbecue sauce. Teriyaki sauce. Soy sauce, including reduced-sodium. Steak sauce. Canned and packaged gravies. Fish sauce. Oyster sauce. Cocktail sauce. Horseradish that you find on the shelf. Ketchup. Mustard. Meat flavorings and tenderizers. Bouillon cubes. Hot sauce and Tabasco sauce. Premade or packaged marinades. Premade or packaged taco seasonings. Relishes. Regular salad dressings. Where to find more information:  National Heart, Lung, and Blood Institute: www.nhlbi.nih.gov  American Heart Association: www.heart.org Summary  The DASH eating plan is a healthy eating plan that has been shown to reduce high blood pressure (hypertension). It may also reduce your risk for type 2 diabetes, heart disease, and stroke.  With the DASH eating plan, you should limit salt (sodium) intake to 2,300 mg a day. If you have hypertension, you may need to reduce your sodium intake to 1,500 mg a day.  When on the DASH eating plan, aim to eat more fresh fruits and vegetables, whole grains, lean proteins, low-fat dairy, and heart-healthy fats.  Work with your health care provider or diet and nutrition specialist (dietitian) to adjust your eating plan to your individual   calorie needs. This information is not intended to replace advice given to you by your health care provider. Make sure you discuss any questions you have with your health care provider. Document Released: 10/16/2011 Document Revised: 10/20/2016 Document Reviewed: 10/20/2016 Elsevier Interactive Patient Education  2017 Elsevier Inc.  

## 2017-03-21 LAB — COMPREHENSIVE METABOLIC PANEL
ALK PHOS: 82 IU/L (ref 39–117)
ALT: 17 IU/L (ref 0–32)
AST: 21 IU/L (ref 0–40)
Albumin/Globulin Ratio: 1.8 (ref 1.2–2.2)
Albumin: 4.4 g/dL (ref 3.5–5.5)
BILIRUBIN TOTAL: 0.8 mg/dL (ref 0.0–1.2)
BUN/Creatinine Ratio: 12 (ref 9–23)
BUN: 8 mg/dL (ref 6–20)
CHLORIDE: 100 mmol/L (ref 96–106)
CO2: 21 mmol/L (ref 18–29)
CREATININE: 0.66 mg/dL (ref 0.57–1.00)
Calcium: 9.5 mg/dL (ref 8.7–10.2)
GFR calc Af Amer: 134 mL/min/{1.73_m2} (ref >59)
GFR calc non Af Amer: 116 mL/min/{1.73_m2} (ref >59)
GLOBULIN, TOTAL: 2.5 g/dL (ref 1.5–4.5)
Glucose: 86 mg/dL (ref 65–99)
POTASSIUM: 4.2 mmol/L (ref 3.5–5.2)
SODIUM: 140 mmol/L (ref 134–144)
Total Protein: 6.9 g/dL (ref 6.0–8.5)

## 2017-03-21 LAB — TSH: TSH: 1.19 u[IU]/mL (ref 0.450–4.500)

## 2017-03-27 ENCOUNTER — Telehealth: Payer: Self-pay

## 2017-03-27 NOTE — Telephone Encounter (Signed)
Pt calling requesting short supply of refill on tramadol. Is this okay? I informed pt Dr. Judithann GravesBerglund is out until June 4th - unable to make call without her permission. Pt verbally understood and said this was fine.

## 2017-04-13 ENCOUNTER — Other Ambulatory Visit: Payer: Self-pay | Admitting: Internal Medicine

## 2017-04-13 MED ORDER — TRAMADOL HCL 50 MG PO TABS: 50.0000 mg | ORAL_TABLET | Freq: Three times a day (TID) | ORAL | 0 refills | Status: DC | PRN

## 2017-04-24 ENCOUNTER — Ambulatory Visit: Payer: Self-pay | Admitting: Internal Medicine

## 2017-05-01 ENCOUNTER — Ambulatory Visit (INDEPENDENT_AMBULATORY_CARE_PROVIDER_SITE_OTHER): Payer: Managed Care, Other (non HMO) | Admitting: Internal Medicine

## 2017-05-01 ENCOUNTER — Encounter: Payer: Self-pay | Admitting: Internal Medicine

## 2017-05-01 VITALS — BP 150/100 | HR 102 | Temp 98.4°F | Ht 60.0 in | Wt 174.0 lb

## 2017-05-01 DIAGNOSIS — H6690 Otitis media, unspecified, unspecified ear: Secondary | ICD-10-CM | POA: Diagnosis not present

## 2017-05-01 DIAGNOSIS — R030 Elevated blood-pressure reading, without diagnosis of hypertension: Secondary | ICD-10-CM | POA: Diagnosis not present

## 2017-05-01 MED ORDER — AMOXICILLIN-POT CLAVULANATE 875-125 MG PO TABS: 1.0000 | ORAL_TABLET | Freq: Two times a day (BID) | ORAL | 0 refills | Status: DC

## 2017-05-01 NOTE — Patient Instructions (Signed)
Resume Nasacort nasal spray (or any generic like Fluticasone, etc)  Monitor blood pressure several times per week - goal is 130/80 or less

## 2017-05-01 NOTE — Progress Notes (Signed)
Date:  05/01/2017   Name:  Amy Gomez   DOB:  09/21/1981   MRN:  [id number]   Chief Complaint: Ear Pain (Both ears got better for a little bit. Started hurting bad last week. Friday could not hear anything out of Rt ear at times. Worse than left. Feels full. Tried blowing through nose to see if can relieve pressure and no relief in Rt ear. ) Otalgia   There is pain in the right ear. This is a recurrent problem. The problem occurs constantly. The problem has been gradually worsening. There has been no fever. Associated symptoms include ear discharge and hearing loss. Pertinent negatives include no abdominal pain or sore throat. The treatment provided significant relief.      Review of Systems  Constitutional: Negative for chills, fatigue and fever.  HENT: Positive for ear discharge, ear pain and hearing loss. Negative for sore throat and trouble swallowing.   Respiratory: Negative for chest tightness and shortness of breath.   Cardiovascular: Negative for chest pain and palpitations.  Gastrointestinal: Negative for abdominal pain.    Patient Active Problem List   Diagnosis Date Noted  . Gastroesophageal reflux disease without esophagitis 12/18/2016  . RUQ abdominal pain 12/18/2016  . 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 Dysmenorrhea 06/04/2015  . Elevated blood pressure, situational 06/04/2015  . Migraine without aura and responsive to treatment 06/04/2015  . 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 Authorizing Provider  meclizine (ANTIVERT) 25 MG tablet Take 1 tablet (25 mg total) by mouth 3 (three) times daily as needed for dizziness. 03/20/17  Yes Reubin MilanBerglund, Emaan Gary H, MD  traMADol (ULTRAM) 50 MG tablet Take 1 tablet (50 mg total) by mouth every 8 (eight) hours as needed (Do not drive or operate machinery while taking as can cause drowsiness.). 04/13/17  Yes Reubin MilanBerglund, Cristella Stiver H, MD    No Known Allergies  Past  Surgical History:  Procedure Laterality Date  . TUBAL LIGATION Bilateral 2007    Social History  Substance Use Topics  . Smoking status: Current Every Day Smoker  . Smokeless tobacco: Never Used  . Alcohol use No     Medication list has been reviewed and updated.   Physical Exam  Constitutional: She is oriented to person, place, and time. She appears well-developed. No distress.  HENT:  Head: Normocephalic and atraumatic.  Right Ear: Tympanic membrane is erythematous and retracted. No middle ear effusion.  Left Ear: Tympanic membrane is retracted. Tympanic membrane is not erythematous.  No middle ear effusion.  Nose: Right sinus exhibits no maxillary sinus tenderness. Left sinus exhibits no maxillary sinus tenderness.  Cardiovascular: Normal rate, regular rhythm and normal heart sounds.   Pulmonary/Chest: Effort normal and breath sounds normal. No respiratory distress. She has no wheezes.  Musculoskeletal: Normal range of motion.  Neurological: She is alert and oriented to person, place, and time.  Skin: Skin is warm and dry. No rash noted.  Psychiatric: She has a normal mood and affect. Her speech is normal and behavior is normal. Thought content normal.  Nursing note and vitals reviewed.   BP (!) 150/100   Pulse (!) 102   Temp 98.4 F (36.9 C)   Ht 5' (1.524 m)   Wt 174 lb (78.9 kg)   LMP 04/17/2017 (Approximate)   SpO2 97%   BMI 33.98 kg/m   Assessment and Plan: 1. Elevated blood pressure, situational Monitor at home Cut  back on energy drinks  2. Acute otitis media, unspecified otitis media type Resume steroid nasal spray - amoxicillin-clavulanate (AUGMENTIN) 875-125 MG tablet; Take 1 tablet by mouth 2 (two) times daily.  Dispense: 20 tablet; Refill: 0   Meds ordered this encounter  Medications  . amoxicillin-clavulanate (AUGMENTIN) 875-125 MG tablet    Sig: Take 1 tablet by mouth 2 (two) times daily.    Dispense:  20 tablet    Refill:  0    Bari Edward, MD [facility]  05/01/2017

## 2017-05-26 ENCOUNTER — Telehealth: Payer: Self-pay

## 2017-05-26 ENCOUNTER — Other Ambulatory Visit: Payer: Self-pay | Admitting: Internal Medicine

## 2017-05-26 MED ORDER — TRAMADOL HCL 50 MG PO TABS: 50.0000 mg | ORAL_TABLET | Freq: Three times a day (TID) | ORAL | 0 refills | Status: DC | PRN

## 2017-05-26 NOTE — Telephone Encounter (Signed)
Pt called stating she has money left on her flex spending card for insurance and has to use this by September 1st. Was trying to get tramadol sent into [facility] in [place] to try and use this money before time runs out. Please Advise.

## 2017-05-26 NOTE — Telephone Encounter (Signed)
I sent in prescription for #40 Tramadol - this should last a while.

## 2017-07-14 ENCOUNTER — Ambulatory Visit: Payer: Self-pay | Admitting: Internal Medicine

## 2017-07-15 ENCOUNTER — Encounter: Payer: Self-pay | Admitting: Internal Medicine

## 2017-07-15 ENCOUNTER — Ambulatory Visit (INDEPENDENT_AMBULATORY_CARE_PROVIDER_SITE_OTHER): Payer: Medicaid Other | Admitting: Internal Medicine

## 2017-07-15 VITALS — BP 168/118 | HR 97 | Temp 98.8°F | Ht 60.0 in | Wt 172.0 lb

## 2017-07-15 DIAGNOSIS — I10 Essential (primary) hypertension: Secondary | ICD-10-CM | POA: Insufficient documentation

## 2017-07-15 DIAGNOSIS — H6983 Other specified disorders of Eustachian tube, bilateral: Secondary | ICD-10-CM

## 2017-07-15 DIAGNOSIS — G43009 Migraine without aura, not intractable, without status migrainosus: Secondary | ICD-10-CM

## 2017-07-15 MED ORDER — FLUTICASONE PROPIONATE 50 MCG/ACT NA SUSP: 2.0000 | Freq: Every day | NASAL | 6 refills | Status: DC

## 2017-07-15 MED ORDER — METOPROLOL SUCCINATE ER 50 MG PO TB24: 50.0000 mg | ORAL_TABLET | Freq: Every day | ORAL | 2 refills | Status: DC

## 2017-07-15 NOTE — Progress Notes (Signed)
Date:  07/15/2017   Name:  Amy Gomez   DOB:  12/06/1980   MRN:  [id number]   Chief Complaint: Ear Pain (Both ears hurting. States its been happening on and off for 6 months. Having trouble hearing out of  Rt ear. Wants both checked. - wants referral to ENT ) Otalgia   Associated symptoms include headaches. Pertinent negatives include no abdominal pain or sore throat.  Hypertension  This is a new problem. The current episode started more than 1 month ago. The problem is unchanged. The problem is uncontrolled. Associated symptoms include headaches. Pertinent negatives include no chest pain, palpitations, peripheral edema or shortness of breath. There are no associated agents to hypertension. Compliance problems: drinks high in caffeine.   Migraine   This is a recurrent problem. The current episode started more than 1 year ago. The problem occurs intermittently. The pain quality is similar to prior headaches. Associated symptoms include ear pain. Pertinent negatives include no abdominal pain, fever, sinus pressure or sore throat. She has tried NSAIDs for the symptoms. The treatment provided moderate relief. Her past medical history is significant for hypertension.  She has had more or less continuous tear pain and congestion. Last treated with antibiotics in June symptoms are not completely resolved. She denies drainage she feels like there is tenderness around her ears. Slight sore throat no green or yellow production. She's been taking only Tylenol or ibuprofen for pain. No  decongestants or antihistamines.    Review of Systems  Constitutional: Negative for chills, fatigue and fever.  HENT: Positive for ear pain and sneezing. Negative for congestion, sinus pain, sinus pressure, sore throat and trouble swallowing.   Respiratory: Negative for chest tightness and shortness of breath.   Cardiovascular: Negative for chest pain, palpitations and leg swelling.  Gastrointestinal: Negative for  abdominal pain.  Allergic/Immunologic: Negative for environmental allergies.  Neurological: Positive for headaches.    Patient Active Problem List   Diagnosis Date Noted  . Gastroesophageal reflux disease without esophagitis 12/18/2016  . RUQ abdominal pain 12/18/2016  . 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 Dysmenorrhea 06/04/2015  . Elevated blood pressure, situational 06/04/2015  . Migraine without aura and responsive to treatment 06/04/2015  . 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 Authorizing Provider  meclizine (ANTIVERT) 25 MG tablet Take 1 tablet (25 mg total) by mouth 3 (three) times daily as needed for dizziness. 03/20/17   Reubin MilanBerglund, Lyrah Bradt H, MD  traMADol (ULTRAM) 50 MG tablet Take 1 tablet (50 mg total) by mouth every 8 (eight) hours as needed (Do not drive or operate machinery while taking as can cause drowsiness.). 05/26/17   Reubin MilanBerglund, Arlin Savona H, MD    No Known Allergies  Past Surgical History:  Procedure Laterality Date  . TUBAL LIGATION Bilateral 2007    Social History  Substance Use Topics  . Smoking status: Current Every Day Smoker  . Smokeless tobacco: Never Used  . Alcohol use No     Medication list has been reviewed and updated.  PHQ 2/9 Scores 07/15/2017  PHQ - 2 Score 0    Physical Exam  Constitutional: She is oriented to person, place, and time. She appears well-developed. No distress.  HENT:  Head: Normocephalic and atraumatic.  Right Ear: Ear canal normal. Tympanic membrane is retracted. Tympanic membrane is not erythematous.  Left Ear: Ear canal normal. Tympanic membrane is retracted. Tympanic membrane is not erythematous.  Nose: Right sinus exhibits no maxillary sinus tenderness and no frontal sinus tenderness. Left sinus exhibits no maxillary sinus tenderness and no frontal sinus tenderness.  Mouth/Throat: No posterior oropharyngeal erythema.  Neck: Normal range of motion and full  passive range of motion without pain. Neck supple.  Cardiovascular: Normal rate, regular rhythm and normal heart sounds.   Pulmonary/Chest: Effort normal and breath sounds normal. No respiratory distress. She has no decreased breath sounds. She has no wheezes.  Musculoskeletal: Normal range of motion.  Neurological: She is alert and oriented to person, place, and time. She has normal strength. No sensory deficit.  Skin: Skin is warm and dry. No rash noted.  Psychiatric: She has a normal mood and affect. Her behavior is normal. Thought content normal.  Nursing note and vitals reviewed.   BP (!) 168/118   Pulse 97   Temp 98.8 F (37.1 C)   Ht 5' (1.524 m)   Wt 172 lb (78 kg)   LMP 07/07/2017 (Exact Date)   SpO2 99%   BMI 33.59 kg/m   Assessment and Plan: 1. Essential hypertension Begin medication to also help with migraine prevention - metoprolol succinate (TOPROL-XL) 50 MG 24 hr tablet; Take 1 tablet (50 mg total) by mouth daily. Take with or immediately following a meal.  Dispense: 30 tablet; Refill: 2  2. Dysfunction of both eustachian tubes - fluticasone (FLONASE) 50 MCG/ACT nasal spray; Place 2 sprays into both nostrils daily.  Dispense: 16 g; Refill: 6 - Ambulatory referral to ENT  3. Migraine without aura and responsive to treatment Continue nsaids as needed Beta blocker therapy may be beneficial as well   Meds ordered this encounter  Medications  . metoprolol succinate (TOPROL-XL) 50 MG 24 hr tablet    Sig: Take 1 tablet (50 mg total) by mouth daily. Take with or immediately following a meal.    Dispense:  30 tablet    Refill:  2  . fluticasone (FLONASE) 50 MCG/ACT nasal spray    Sig: Place 2 sprays into both nostrils daily.    Dispense:  16 g    Refill:  6    Partially dictated using Animal nutritionist. Any errors are unintentional.  Bari Edward, MD [facility]  07/15/2017

## 2017-07-15 NOTE — Patient Instructions (Signed)
Bring blood pressure cuff from home for next visit

## 2017-08-17 ENCOUNTER — Encounter: Payer: Self-pay | Admitting: Internal Medicine

## 2017-08-17 ENCOUNTER — Ambulatory Visit (INDEPENDENT_AMBULATORY_CARE_PROVIDER_SITE_OTHER): Payer: Medicaid Other | Admitting: Internal Medicine

## 2017-08-17 VITALS — BP 146/104 | HR 83 | Temp 98.5°F | Ht 60.0 in | Wt 173.0 lb

## 2017-08-17 DIAGNOSIS — I10 Essential (primary) hypertension: Secondary | ICD-10-CM

## 2017-08-17 DIAGNOSIS — R6889 Other general symptoms and signs: Secondary | ICD-10-CM

## 2017-08-17 DIAGNOSIS — J40 Bronchitis, not specified as acute or chronic: Secondary | ICD-10-CM

## 2017-08-17 LAB — POCT INFLUENZA A/B
INFLUENZA A, POC: NEGATIVE
INFLUENZA B, POC: NEGATIVE

## 2017-08-17 MED ORDER — LISINOPRIL 10 MG PO TABS: 10.0000 mg | ORAL_TABLET | Freq: Every day | ORAL | 3 refills | Status: DC

## 2017-08-17 MED ORDER — AZITHROMYCIN 250 MG PO TABS: ORAL_TABLET | ORAL | 0 refills | Status: DC

## 2017-08-17 NOTE — Patient Instructions (Signed)
Cut metoprolol in half.  Start lisinopril once bronchitis has resolved and zpak is completed.  DASH Eating Plan DASH stands for "Dietary Approaches to Stop Hypertension." The DASH eating plan is a healthy eating plan that has been shown to reduce high blood pressure (hypertension). It may also reduce your risk for type 2 diabetes, heart disease, and stroke. The DASH eating plan may also help with weight loss. What are tips for following this plan? General guidelines  Avoid eating more than 2,300 mg (milligrams) of salt (sodium) a day. If you have hypertension, you may need to reduce your sodium intake to 1,500 mg a day.  Limit alcohol intake to no more than 1 drink a day for nonpregnant women and 2 drinks a day for men. One drink equals 12 oz of beer, 5 oz of wine, or 1 oz of hard liquor.  Work with your health care provider to maintain a healthy body weight or to lose weight. Ask what an ideal weight is for you.  Get at least 30 minutes of exercise that causes your heart to beat faster (aerobic exercise) most days of the week. Activities may include walking, swimming, or biking.  Work with your health care provider or diet and nutrition specialist (dietitian) to adjust your eating plan to your individual calorie needs. Reading food labels  Check food labels for the amount of sodium per serving. Choose foods with less than 5 percent of the Daily Value of sodium. Generally, foods with less than 300 mg of sodium per serving fit into this eating plan.  To find whole grains, look for the word "whole" as the first word in the ingredient list. Shopping  Buy products labeled as "low-sodium" or "no salt added."  Buy fresh foods. Avoid canned foods and premade or frozen meals. Cooking  Avoid adding salt when cooking. Use salt-free seasonings or herbs instead of table salt or sea salt. Check with your health care provider or pharmacist before using salt substitutes.  Do not fry foods. Cook  foods using healthy methods such as baking, boiling, grilling, and broiling instead.  Cook with heart-healthy oils, such as olive, canola, soybean, or sunflower oil. Meal planning   Eat a balanced diet that includes: ? 5 or more servings of fruits and vegetables each day. At each meal, try to fill half of your plate with fruits and vegetables. ? Up to 6-8 servings of whole grains each day. ? Less than 6 oz of lean meat, poultry, or fish each day. A 3-oz serving of meat is about the same size as a deck of cards. One egg equals 1 oz. ? 2 servings of low-fat dairy each day. ? A serving of nuts, seeds, or beans 5 times each week. ? Heart-healthy fats. Healthy fats called Omega-3 fatty acids are found in foods such as flaxseeds and coldwater fish, like sardines, salmon, and mackerel.  Limit how much you eat of the following: ? Canned or prepackaged foods. ? Food that is high in trans fat, such as fried foods. ? Food that is high in saturated fat, such as fatty meat. ? Sweets, desserts, sugary drinks, and other foods with added sugar. ? Full-fat dairy products.  Do not salt foods before eating.  Try to eat at least 2 vegetarian meals each week.  Eat more home-cooked food and less restaurant, buffet, and fast food.  When eating at a restaurant, ask that your food be prepared with less salt or no salt, if possible. What foods are  recommended? The items listed may not be a complete list. Talk with your dietitian about what dietary choices are best for you. Grains Whole-grain or whole-wheat bread. Whole-grain or whole-wheat pasta. Brown rice. [place]. Bulgur. Whole-grain and low-sodium cereals. Pita bread. Low-fat, low-sodium crackers. Whole-wheat flour tortillas. Vegetables Fresh or frozen vegetables (raw, steamed, roasted, or grilled). Low-sodium or reduced-sodium tomato and vegetable juice. Low-sodium or reduced-sodium tomato sauce and tomato paste. Low-sodium or reduced-sodium  canned vegetables. Fruits All fresh, dried, or frozen fruit. Canned fruit in natural juice (without added sugar). Meat and other protein foods Skinless chicken or [place]. Ground chicken or [place]. Pork with fat trimmed off. Fish and seafood. Egg whites. Dried beans, peas, or lentils. Unsalted nuts, nut butters, and seeds. Unsalted canned beans. Lean cuts of beef with fat trimmed off. Low-sodium, lean deli meat. Dairy Low-fat (1%) or fat-free (skim) milk. Fat-free, low-fat, or reduced-fat cheeses. Nonfat, low-sodium ricotta or cottage cheese. Low-fat or nonfat yogurt. Low-fat, low-sodium cheese. Fats and oils Soft margarine without trans fats. Vegetable oil. Low-fat, reduced-fat, or light mayonnaise and salad dressings (reduced-sodium). Canola, safflower, olive, soybean, and sunflower oils. Avocado. Seasoning and other foods Herbs. Spices. Seasoning mixes without salt. Unsalted popcorn and pretzels. Fat-free sweets. What foods are not recommended? The items listed may not be a complete list. Talk with your dietitian about what dietary choices are best for you. Grains Baked goods made with fat, such as croissants, muffins, or some breads. Dry pasta or rice meal packs. Vegetables Creamed or fried vegetables. Vegetables in a cheese sauce. Regular canned vegetables (not low-sodium or reduced-sodium). Regular canned tomato sauce and paste (not low-sodium or reduced-sodium). Regular tomato and vegetable juice (not low-sodium or reduced-sodium). Angie Fava. Olives. Fruits Canned fruit in a light or heavy syrup. Fried fruit. Fruit in cream or butter sauce. Meat and other protein foods Fatty cuts of meat. Ribs. Fried meat. Berniece Salines. Sausage. Bologna and other processed lunch meats. Salami. Fatback. Hotdogs. Bratwurst. Salted nuts and seeds. Canned beans with added salt. Canned or smoked fish. Whole eggs or egg yolks. Chicken or [place] with skin. Dairy Whole or 2% milk, cream, and half-and-half. Whole or  full-fat cream cheese. Whole-fat or sweetened yogurt. Full-fat cheese. Nondairy creamers. Whipped toppings. Processed cheese and cheese spreads. Fats and oils Butter. Stick margarine. Lard. Shortening. Ghee. Bacon fat. Tropical oils, such as coconut, palm kernel, or palm oil. Seasoning and other foods Salted popcorn and pretzels. Onion salt, garlic salt, seasoned salt, table salt, and sea salt. Worcestershire sauce. Tartar sauce. Barbecue sauce. Teriyaki sauce. Soy sauce, including reduced-sodium. Steak sauce. Canned and packaged gravies. Fish sauce. Oyster sauce. Cocktail sauce. Horseradish that you find on the shelf. Ketchup. Mustard. Meat flavorings and tenderizers. Bouillon cubes. Hot sauce and Tabasco sauce. Premade or packaged marinades. Premade or packaged taco seasonings. Relishes. Regular salad dressings. Where to find more information:  National Heart, Lung, and [place]: https://wilson-eaton.com/  American Heart Association: www.heart.org Summary  The DASH eating plan is a healthy eating plan that has been shown to reduce high blood pressure (hypertension). It may also reduce your risk for type 2 diabetes, heart disease, and stroke.  With the DASH eating plan, you should limit salt (sodium) intake to 2,300 mg a day. If you have hypertension, you may need to reduce your sodium intake to 1,500 mg a day.  When on the DASH eating plan, aim to eat more fresh fruits and vegetables, whole grains, lean proteins, low-fat dairy, and heart-healthy fats.  Work with your health  care provider or diet and nutrition specialist (dietitian) to adjust your eating plan to your individual calorie needs. This information is not intended to replace advice given to you by your health care provider. Make sure you discuss any questions you have with your health care provider. Document Released: 10/16/2011 Document Revised: 10/20/2016 Document Reviewed: 10/20/2016 Elsevier Interactive Patient Education  2017  [employer].

## 2017-08-17 NOTE — Progress Notes (Signed)
Date:  08/17/2017   Name:  Amy Gomez   DOB:  [birth year]   MRN:  [id number]   Chief Complaint: Generalized Body Aches (X 2 days. Fever up to 103.1. Body aching. Not sleeping or eating. Coughing yesterday with voice gone. Feeling a little bit better today but still aching all over body. Headache currently. ) and Hypertension (BP still elevated on diastolic number. 130/105. )  Hypertension  This is a chronic problem. The problem is unchanged. The problem is uncontrolled. Associated symptoms include headaches. Pertinent negatives include no chest pain, palpitations or shortness of breath.  Influenza  This is a new problem. The current episode started yesterday. The problem occurs constantly. Associated symptoms include chills, coughing, a fever, headaches and myalgias. Pertinent negatives include no abdominal pain, chest pain, nausea, sore throat or vomiting. She has tried acetaminophen and NSAIDs for the symptoms.     Review of Systems  Constitutional: Positive for chills and fever.  HENT: Negative for sore throat.   Respiratory: Positive for cough, chest tightness and wheezing. Negative for shortness of breath.   Cardiovascular: Negative for chest pain, palpitations and leg swelling.  Gastrointestinal: Positive for diarrhea. Negative for abdominal pain, nausea and vomiting.  Musculoskeletal: Positive for myalgias.  Neurological: Positive for headaches. Negative for dizziness.    Patient Active Problem List   Diagnosis Date Noted  . Essential hypertension 07/15/2017  . Dysfunction of both eustachian tubes 07/15/2017  . Gastroesophageal reflux disease without esophagitis 12/18/2016  . RUQ abdominal pain 12/18/2016  . 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 Dysmenorrhea 06/04/2015  . Migraine without aura and responsive to treatment 06/04/2015  . 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 Authorizing Provider    fluticasone (FLONASE) 50 MCG/ACT nasal spray Place 2 sprays into both nostrils daily. 07/15/17  Yes Reubin Milan, MD  meclizine (ANTIVERT) 25 MG tablet Take 1 tablet (25 mg total) by mouth 3 (three) times daily as needed for dizziness. 03/20/17  Yes Reubin Milan, MD  metoprolol succinate (TOPROL-XL) 50 MG 24 hr tablet Take 1 tablet (50 mg total) by mouth daily. Take with or immediately following a meal. 07/15/17  Yes Reubin Milan, MD    No Known Allergies  Past Surgical History:  Procedure Laterality Date  . TUBAL LIGATION Bilateral 2007    Social History  Substance Use Topics  . Smoking status: Current Every Day Smoker    Packs/day: 0.25  . Smokeless tobacco: Never Used  . Alcohol use No     Medication list has been reviewed and updated.  PHQ 2/9 Scores 07/15/2017  PHQ - 2 Score 0    Physical Exam  Constitutional: She is oriented to person, place, and time. She appears well-developed. She has a sickly appearance. No distress.  HENT:  Head: Normocephalic and atraumatic.  Right Ear: Tympanic membrane and ear canal normal.  Left Ear: Tympanic membrane and ear canal normal.  Nose: Right sinus exhibits no maxillary sinus tenderness. Left sinus exhibits no maxillary sinus tenderness.  Mouth/Throat: No posterior oropharyngeal edema or posterior oropharyngeal erythema.  Cardiovascular: Normal rate and regular rhythm.   Pulmonary/Chest: Effort normal. No respiratory distress. She has decreased breath sounds. She has wheezes. She has no rhonchi. She has no rales.  Musculoskeletal: Normal range of motion.  Lymphadenopathy:    She has no cervical adenopathy.  Neurological: She is alert and oriented to person, place, and time.  Skin: Skin is warm and dry. No rash noted.  Psychiatric: She has a normal mood and affect. Her behavior is normal. Thought content normal.  Nursing note and vitals reviewed.   BP (!) 146/104 (BP Location: Left Arm, Patient Position: Sitting, Cuff  Size: Normal)   Pulse 83   Temp 98.5 F (36.9 C) (Oral)   Ht 5' (1.524 m)   Wt 173 lb (78.5 kg)   LMP 08/10/2017 (Approximate)   SpO2 99%   BMI 33.79 kg/m   Assessment and Plan: 1. Flu-like symptoms Continue supportive care, fluids, advil - POCT Influenza A/B  2. Bronchitis - azithromycin (ZITHROMAX Z-PAK) 250 MG tablet; UAD  Dispense: 6 each; Refill: 0  3. Essential hypertension Reduce metoprolol to 25 mg daily and add lisinopril - lisinopril (PRINIVIL,ZESTRIL) 10 MG tablet; Take 1 tablet (10 mg total) by mouth daily.  Dispense: 90 tablet; Refill: 3   Meds ordered this encounter  Medications  . azithromycin (ZITHROMAX Z-PAK) 250 MG tablet    Sig: UAD    Dispense:  6 each    Refill:  0  . lisinopril (PRINIVIL,ZESTRIL) 10 MG tablet    Sig: Take 1 tablet (10 mg total) by mouth daily.    Dispense:  90 tablet    Refill:  3    Partially dictated using Animal nutritionist. Any errors are unintentional.  Bari Edward, MD [facility]  08/17/2017

## 2017-08-26 ENCOUNTER — Ambulatory Visit: Payer: Self-pay | Admitting: Internal Medicine

## 2017-09-28 ENCOUNTER — Encounter: Payer: Self-pay | Admitting: Internal Medicine

## 2017-09-28 ENCOUNTER — Ambulatory Visit (INDEPENDENT_AMBULATORY_CARE_PROVIDER_SITE_OTHER): Payer: Medicaid Other | Admitting: Internal Medicine

## 2017-09-28 ENCOUNTER — Other Ambulatory Visit
Admission: RE | Admit: 2017-09-28 | Discharge: 2017-09-28 | Disposition: A | Discharge status: Home / Self Care | Payer: Medicaid Other | Source: Ambulatory Visit | Attending: Internal Medicine | Admitting: Internal Medicine

## 2017-09-28 VITALS — BP 118/78 | HR 76 | Ht 60.0 in | Wt 174.0 lb

## 2017-09-28 DIAGNOSIS — I10 Essential (primary) hypertension: Secondary | ICD-10-CM

## 2017-09-28 DIAGNOSIS — G43009 Migraine without aura, not intractable, without status migrainosus: Secondary | ICD-10-CM

## 2017-09-28 DIAGNOSIS — N946 Dysmenorrhea, unspecified: Secondary | ICD-10-CM | POA: Insufficient documentation

## 2017-09-28 LAB — CBC WITH DIFFERENTIAL/PLATELET
BASOS PCT: 1 %
Basophils Absolute: 0.1 10*3/uL (ref 0–0.1)
EOS ABS: 0.3 10*3/uL (ref 0–0.7)
Eosinophils Relative: 2 %
HCT: 40.8 % (ref 35.0–47.0)
HEMOGLOBIN: 13.8 g/dL (ref 12.0–16.0)
Lymphocytes Relative: 20 %
Lymphs Abs: 2.7 10*3/uL (ref 1.0–3.6)
MCH: 29.1 pg (ref 26.0–34.0)
MCHC: 33.9 g/dL (ref 32.0–36.0)
MCV: 85.9 fL (ref 80.0–100.0)
MONOS PCT: 6 %
Monocytes Absolute: 0.8 10*3/uL (ref 0.2–0.9)
Neutro Abs: 9.6 10*3/uL — ABNORMAL HIGH (ref 1.4–6.5)
Neutrophils Relative %: 71 %
PLATELETS: 222 10*3/uL (ref 150–440)
RBC: 4.75 MIL/uL (ref 3.80–5.20)
RDW: 15 % — AB (ref 11.5–14.5)
WBC: 13.5 10*3/uL — ABNORMAL HIGH (ref 3.6–11.0)

## 2017-09-28 MED ORDER — TRAMADOL HCL 50 MG PO TABS: 50.0000 mg | ORAL_TABLET | Freq: Three times a day (TID) | ORAL | 0 refills | Status: DC | PRN

## 2017-09-28 MED ORDER — IBUPROFEN 800 MG PO TABS: 800.0000 mg | ORAL_TABLET | Freq: Three times a day (TID) | ORAL | 5 refills | Status: DC | PRN

## 2017-09-28 MED ORDER — LISINOPRIL 20 MG PO TABS: 20.0000 mg | ORAL_TABLET | Freq: Every day | ORAL | 5 refills | Status: DC

## 2017-09-28 NOTE — Progress Notes (Signed)
Date:  09/28/2017   Name:  Amy Gomez   DOB:  06/08/1981   MRN:  [id number]   Chief Complaint: Hypertension (Follow up- feel much better with meds. ) and Headache (Wants refill tramadol, and ibprofen. )  Hypertension  This is a new problem. The current episode started more than 1 month ago. The problem has been gradually improving since onset. Associated symptoms include headaches. Pertinent negatives include no chest pain, palpitations or shortness of breath. Risk factors for coronary artery disease include smoking/tobacco exposure. Past treatments include ACE inhibitors and beta blockers. The current treatment provides significant improvement.  Migraine   This is a chronic problem. The problem occurs monthly. The problem has been gradually improving. Pertinent negatives include no abdominal pain, coughing, dizziness or fever. Her past medical history is significant for hypertension.   Heavy menses - having prolonged bleeding now.  No cramping or weakness. She is still struggling to lose weight.  She is drinking large quantities of coffee beverages per day plus energy drinks. She would like to do better but is having trouble with will power. She also notices swelling in hand and legs intermittently - seems to rapidly improve with no intervention.  Review of Systems  Constitutional: Positive for appetite change. Negative for chills, fatigue, fever and unexpected weight change.  Eyes: Negative for visual disturbance.  Respiratory: Negative for cough, chest tightness and shortness of breath.   Cardiovascular: Positive for leg swelling (intermittently). Negative for chest pain and palpitations.  Gastrointestinal: Negative for abdominal pain.  Neurological: Positive for headaches. Negative for dizziness.  Psychiatric/Behavioral: Negative for dysphoric mood and sleep disturbance. The patient is not nervous/anxious.     Patient Active Problem List   Diagnosis Date Noted  . Essential  hypertension 07/15/2017  . Dysfunction of both eustachian tubes 07/15/2017  . Gastroesophageal reflux disease without esophagitis 12/18/2016  . RUQ abdominal pain 12/18/2016  . 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 Dysmenorrhea 06/04/2015  . Migraine without aura and responsive to treatment 06/04/2015  . 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 Authorizing Provider  fluticasone (FLONASE) 50 MCG/ACT nasal spray Place 2 sprays into both nostrils daily. 07/15/17  Yes Reubin MilanBerglund, Lauralye Kinn H, MD  lisinopril (PRINIVIL,ZESTRIL) 10 MG tablet Take 1 tablet (10 mg total) by mouth daily. 08/17/17  Yes Reubin MilanBerglund, Keiara Sneeringer H, MD  metoprolol succinate (TOPROL-XL) 50 MG 24 hr tablet Take 1 tablet (50 mg total) by mouth daily. Take with or immediately following a meal. 07/15/17  Yes Reubin MilanBerglund, Lucy Woolever H, MD    No Known Allergies  Past Surgical History:  Procedure Laterality Date  . TUBAL LIGATION Bilateral 2007    Social History   Tobacco Use  . Smoking status: Current Every Day Smoker    Packs/day: 0.25  . Smokeless tobacco: Never Used  Substance Use Topics  . Alcohol use: No    Alcohol/week: 0.0 oz  . Drug use: No     Medication list has been reviewed and updated.  PHQ 2/9 Scores 07/15/2017  PHQ - 2 Score 0    Physical Exam  Constitutional: She is oriented to person, place, and time. She appears well-developed. No distress.  HENT:  Head: Normocephalic and atraumatic.  Neck: Normal range of motion. Neck supple.  Cardiovascular: Normal rate, regular rhythm and normal heart sounds.  Pulmonary/Chest: Effort normal. No respiratory distress. She has no wheezes. She has no rales.  Musculoskeletal: Normal range of  motion. She exhibits no edema or tenderness.  Neurological: She is alert and oriented to person, place, and time.  Skin: Skin is warm and dry. No rash noted.  Psychiatric: She has a normal mood and affect. Her behavior is normal.  Thought content normal.  Nursing note and vitals reviewed.   BP 118/78   Pulse 76   Ht 5' (1.524 m)   Wt 174 lb (78.9 kg)   LMP 09/27/2017 (Exact Date) Comment: having 2 week periods- started 07/2017  SpO2 98%   BMI 33.98 kg/m   Assessment and Plan: 1. Essential hypertension Increase lisinopril and reduce dose of beta blocker - may help with weight Reduce gradually intake of caffeinated beverages - lisinopril (PRINIVIL,ZESTRIL) 20 MG tablet; Take 1 tablet (20 mg total) daily by mouth.  Dispense: 30 tablet; Refill: 5  2. Migraine without aura and responsive to treatment Continue tramadol PRN - traMADol (ULTRAM) 50 MG tablet; Take 1 tablet (50 mg total) every 8 (eight) hours as needed by mouth.  Dispense: 30 tablet; Refill: 0 - ibuprofen (ADVIL,MOTRIN) 800 MG tablet; Take 1 tablet (800 mg total) every 8 (eight) hours as needed by mouth.  Dispense: 30 tablet; Refill: 5  3. Dysmenorrhea Check for anemia contributing to edema - CBC with Differential/Platelet   Meds ordered this encounter  Medications  . lisinopril (PRINIVIL,ZESTRIL) 20 MG tablet    Sig: Take 1 tablet (20 mg total) daily by mouth.    Dispense:  30 tablet    Refill:  5  . traMADol (ULTRAM) 50 MG tablet    Sig: Take 1 tablet (50 mg total) every 8 (eight) hours as needed by mouth.    Dispense:  30 tablet    Refill:  0  . ibuprofen (ADVIL,MOTRIN) 800 MG tablet    Sig: Take 1 tablet (800 mg total) every 8 (eight) hours as needed by mouth.    Dispense:  30 tablet    Refill:  5    Partially dictated using Animal nutritionistDragon software. Any errors are unintentional.  Bari EdwardLaura Takelia Urieta, MD [facility]  09/28/2017

## 2017-09-28 NOTE — Patient Instructions (Addendum)
[facility]. Maryclare BeaneFransceco, [facility]  Try cutting metoprolol in half to 25 mg per day.

## 2017-10-13 ENCOUNTER — Ambulatory Visit (INDEPENDENT_AMBULATORY_CARE_PROVIDER_SITE_OTHER): Payer: Medicaid Other | Admitting: Internal Medicine

## 2017-10-13 ENCOUNTER — Encounter: Payer: Self-pay | Admitting: Internal Medicine

## 2017-10-13 VITALS — BP 138/68 | HR 89 | Temp 97.9°F | Ht 60.0 in | Wt 174.0 lb

## 2017-10-13 DIAGNOSIS — N3000 Acute cystitis without hematuria: Secondary | ICD-10-CM

## 2017-10-13 DIAGNOSIS — N200 Calculus of kidney: Secondary | ICD-10-CM | POA: Diagnosis not present

## 2017-10-13 LAB — POC URINALYSIS WITH MICROSCOPIC (NON AUTO)MANUAL RESULT
BILIRUBIN UA: NEGATIVE
Epithelial cells, urine per micros: 1
GLUCOSE UA: NEGATIVE
KETONES UA: NEGATIVE
Leukocytes, UA: NEGATIVE
Mucus, UA: 0
NITRITE UA: NEGATIVE
PH UA: 6.5 (ref 5.0–8.0)
Protein, UA: NEGATIVE
RBC UA: NEGATIVE
RBC: 2 M/uL — AB (ref 4.04–5.48)
Spec Grav, UA: 1.01 (ref 1.010–1.025)
UROBILINOGEN UA: 0.2 U/dL
WBC CASTS UA: 2

## 2017-10-13 MED ORDER — CIPROFLOXACIN HCL 250 MG PO TABS: 250.0000 mg | ORAL_TABLET | Freq: Two times a day (BID) | ORAL | 0 refills | Status: AC

## 2017-10-13 NOTE — Progress Notes (Signed)
Date:  10/13/2017   Name:  Amy Gomez   DOB:  03/03/1981   MRN:  [id number]   Chief Complaint: Urinary Tract Infection (Thursday started- was vomiting saturday when felt like peeing. Hurting in lower stomach and side when having to pee. Hurting under right rib too. Lower back pain- Feels like have to go to the bathroom more. Tried Azo yesterday and did not help at all. )  Urinary Tract Infection   This is a new problem. The current episode started in the past 7 days. The problem has been unchanged. The quality of the pain is described as burning. The patient is experiencing no pain. There has been no fever. Associated symptoms include frequency, hesitancy, urgency and vomiting. Pertinent negatives include no chills or hematuria.  She has a hx of multiple right renal stones on CT in 2016.   Review of Systems  Constitutional: Negative for chills, fatigue and fever.  Respiratory: Negative for chest tightness and shortness of breath.   Cardiovascular: Negative for chest pain and palpitations.  Gastrointestinal: Positive for abdominal pain and vomiting.  Genitourinary: Positive for frequency, hesitancy and urgency. Negative for dysuria and hematuria.    Patient Active Problem List   Diagnosis Date Noted  . Essential hypertension 07/15/2017  . Dysfunction of both eustachian tubes 07/15/2017  . Gastroesophageal reflux disease without esophagitis 12/18/2016  . RUQ abdominal pain 12/18/2016  . 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 Dysmenorrhea 06/04/2015  . Migraine without aura and responsive to treatment 06/04/2015  . 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 Authorizing Provider  fluticasone (FLONASE) 50 MCG/ACT nasal spray Place 2 sprays into both nostrils daily. 07/15/17  Yes Reubin MilanBerglund, Dejon Jungman H, MD  ibuprofen (ADVIL,MOTRIN) 800 MG tablet Take 1 tablet (800 mg total) every 8 (eight) hours as needed by mouth. 09/28/17   Yes Reubin MilanBerglund, Mayzie Caughlin H, MD  lisinopril (PRINIVIL,ZESTRIL) 20 MG tablet Take 1 tablet (20 mg total) daily by mouth. 09/28/17  Yes Reubin MilanBerglund, Jadwiga Faidley H, MD  metoprolol succinate (TOPROL-XL) 50 MG 24 hr tablet Take 1 tablet (50 mg total) by mouth daily. Take with or immediately following a meal. 07/15/17  Yes Reubin MilanBerglund, Jacobie Stamey H, MD  traMADol (ULTRAM) 50 MG tablet Take 1 tablet (50 mg total) every 8 (eight) hours as needed by mouth. 09/28/17  Yes Reubin MilanBerglund, Cashel Bellina H, MD    No Known Allergies  Past Surgical History:  Procedure Laterality Date  . TUBAL LIGATION Bilateral 2007    Social History   Tobacco Use  . Smoking status: Current Every Day Smoker    Packs/day: 0.25  . Smokeless tobacco: Never Used  Substance Use Topics  . Alcohol use: No    Alcohol/week: 0.0 oz  . Drug use: No     Medication list has been reviewed and updated.  PHQ 2/9 Scores 07/15/2017  PHQ - 2 Score 0    Physical Exam  Constitutional: She appears well-developed and well-nourished.  Cardiovascular: Normal rate, regular rhythm and normal heart sounds.  Pulmonary/Chest: Effort normal and breath sounds normal. No respiratory distress.  Abdominal: Soft. Bowel sounds are normal. There is tenderness in the suprapubic area. There is CVA tenderness. There is no rebound and no guarding.  Psychiatric: She has a normal mood and affect.  Nursing note and vitals reviewed.   BP 138/68   Pulse 89   Temp 97.9 F (36.6 C) (Oral)   Ht 5' (1.524 m)  Wt 174 lb (78.9 kg)   LMP 09/27/2017 (Exact Date)   SpO2 100%   BMI 33.98 kg/m   Assessment and Plan: 1. Acute cystitis without hematuria Continue fluids - POC urinalysis w microscopic (non auto) - ciprofloxacin (CIPRO) 250 MG tablet; Take 1 tablet (250 mg total) by mouth 2 (two) times daily for 7 days.  Dispense: 14 tablet; Refill: 0  2. Calculus of kidney Probably passed one or two very small non obstructing stones   Meds ordered this encounter  Medications  .  ciprofloxacin (CIPRO) 250 MG tablet    Sig: Take 1 tablet (250 mg total) by mouth 2 (two) times daily for 7 days.    Dispense:  14 tablet    Refill:  0    Partially dictated using Animal nutritionistDragon software. Any errors are unintentional.  Bari EdwardLaura Kerim Statzer, MD [facility]  10/13/2017

## 2017-10-14 ENCOUNTER — Other Ambulatory Visit: Payer: Self-pay | Admitting: Internal Medicine

## 2017-10-14 DIAGNOSIS — I10 Essential (primary) hypertension: Secondary | ICD-10-CM

## 2017-11-26 ENCOUNTER — Emergency Department: Payer: Medicaid Other

## 2017-11-26 ENCOUNTER — Emergency Department
Admission: EM | Admit: 2017-11-26 | Discharge: 2017-11-26 | Disposition: A | Discharge status: Home / Self Care | Payer: Medicaid Other | Attending: Emergency Medicine | Admitting: Emergency Medicine

## 2017-11-26 ENCOUNTER — Other Ambulatory Visit: Payer: Self-pay

## 2017-11-26 ENCOUNTER — Ambulatory Visit
Admission: EM | Admit: 2017-11-26 | Discharge: 2017-11-26 | Disposition: A | Discharge status: Home / Self Care | Payer: Medicaid Other | Attending: Family Medicine | Admitting: Family Medicine

## 2017-11-26 ENCOUNTER — Encounter: Payer: Self-pay | Admitting: Emergency Medicine

## 2017-11-26 DIAGNOSIS — Z79899 Other long term (current) drug therapy: Secondary | ICD-10-CM | POA: Insufficient documentation

## 2017-11-26 DIAGNOSIS — R112 Nausea with vomiting, unspecified: Secondary | ICD-10-CM | POA: Diagnosis not present

## 2017-11-26 DIAGNOSIS — M545 Low back pain: Secondary | ICD-10-CM | POA: Diagnosis not present

## 2017-11-26 DIAGNOSIS — R1031 Right lower quadrant pain: Secondary | ICD-10-CM

## 2017-11-26 DIAGNOSIS — I10 Essential (primary) hypertension: Secondary | ICD-10-CM | POA: Insufficient documentation

## 2017-11-26 DIAGNOSIS — Z87442 Personal history of urinary calculi: Secondary | ICD-10-CM | POA: Insufficient documentation

## 2017-11-26 DIAGNOSIS — N946 Dysmenorrhea, unspecified: Secondary | ICD-10-CM | POA: Insufficient documentation

## 2017-11-26 DIAGNOSIS — R1011 Right upper quadrant pain: Secondary | ICD-10-CM | POA: Insufficient documentation

## 2017-11-26 DIAGNOSIS — F17210 Nicotine dependence, cigarettes, uncomplicated: Secondary | ICD-10-CM | POA: Diagnosis not present

## 2017-11-26 DIAGNOSIS — G43009 Migraine without aura, not intractable, without status migrainosus: Secondary | ICD-10-CM | POA: Diagnosis not present

## 2017-11-26 DIAGNOSIS — R102 Pelvic and perineal pain: Secondary | ICD-10-CM | POA: Insufficient documentation

## 2017-11-26 DIAGNOSIS — K219 Gastro-esophageal reflux disease without esophagitis: Secondary | ICD-10-CM | POA: Diagnosis not present

## 2017-11-26 DIAGNOSIS — R109 Unspecified abdominal pain: Secondary | ICD-10-CM

## 2017-11-26 HISTORY — DX: Calculus of kidney: N20.0

## 2017-11-26 LAB — URINALYSIS, COMPLETE (UACMP) WITH MICROSCOPIC
BACTERIA UA: NONE SEEN
Bilirubin Urine: NEGATIVE
GLUCOSE, UA: NEGATIVE mg/dL
KETONES UR: NEGATIVE mg/dL
Leukocytes, UA: NEGATIVE
Nitrite: NEGATIVE
PROTEIN: 100 mg/dL — AB
Specific Gravity, Urine: 1.016 (ref 1.005–1.030)
pH: 8 (ref 5.0–8.0)

## 2017-11-26 LAB — COMPREHENSIVE METABOLIC PANEL
ALK PHOS: 79 U/L (ref 38–126)
ALT: 17 U/L (ref 14–54)
ANION GAP: 8 (ref 5–15)
AST: 20 U/L (ref 15–41)
Albumin: 4.4 g/dL (ref 3.5–5.0)
BUN: 8 mg/dL (ref 6–20)
CALCIUM: 9.1 mg/dL (ref 8.9–10.3)
CHLORIDE: 105 mmol/L (ref 101–111)
CO2: 25 mmol/L (ref 22–32)
Creatinine, Ser: 0.68 mg/dL (ref 0.44–1.00)
GFR calc non Af Amer: >60 mL/min (ref >60)
GLUCOSE: 107 mg/dL — AB (ref 65–99)
POTASSIUM: 4.1 mmol/L (ref 3.5–5.1)
SODIUM: 138 mmol/L (ref 135–145)
Total Bilirubin: 0.5 mg/dL (ref 0.3–1.2)
Total Protein: 7.5 g/dL (ref 6.5–8.1)

## 2017-11-26 LAB — CBC
HEMATOCRIT: 43.5 % (ref 35.0–47.0)
HEMOGLOBIN: 14.4 g/dL (ref 12.0–16.0)
MCH: 29.1 pg (ref 26.0–34.0)
MCHC: 33.2 g/dL (ref 32.0–36.0)
MCV: 87.8 fL (ref 80.0–100.0)
Platelets: 233 10*3/uL (ref 150–440)
RBC: 4.95 MIL/uL (ref 3.80–5.20)
RDW: 14.3 % (ref 11.5–14.5)
WBC: 14.2 10*3/uL — ABNORMAL HIGH (ref 3.6–11.0)

## 2017-11-26 LAB — LIPASE, BLOOD: LIPASE: 30 U/L (ref 11–51)

## 2017-11-26 LAB — POCT PREGNANCY, URINE: PREG TEST UR: NEGATIVE

## 2017-11-26 LAB — CHLAMYDIA/NGC RT PCR (ARMC ONLY)
CHLAMYDIA TR: NOT DETECTED
N GONORRHOEAE: NOT DETECTED

## 2017-11-26 MED ORDER — TRAMADOL HCL 50 MG PO TABS: 50.0000 mg | ORAL_TABLET | Freq: Four times a day (QID) | ORAL | 0 refills | Status: DC | PRN

## 2017-11-26 MED ORDER — OXYCODONE-ACETAMINOPHEN 5-325 MG PO TABS
2.0000 | ORAL_TABLET | Freq: Once | ORAL | Status: AC
  Administered 2017-11-26: 2 via ORAL
  Filled 2017-11-26: qty 2

## 2017-11-26 MED ORDER — ONDANSETRON HCL 4 MG/2ML IJ SOLN
4.0000 mg | Freq: Once | INTRAMUSCULAR | Status: AC
  Administered 2017-11-26: 4 mg via INTRAVENOUS
  Filled 2017-11-26: qty 2

## 2017-11-26 MED ORDER — ONDANSETRON 8 MG PO TBDP
8.0000 mg | ORAL_TABLET | Freq: Once | ORAL | Status: AC
  Administered 2017-11-26: 8 mg via ORAL

## 2017-11-26 MED ORDER — MORPHINE SULFATE (PF) 4 MG/ML IV SOLN
4.0000 mg | Freq: Once | INTRAVENOUS | Status: AC
  Administered 2017-11-26: 4 mg via INTRAVENOUS
  Filled 2017-11-26: qty 1

## 2017-11-26 NOTE — ED Notes (Signed)
Patient transported to CT 

## 2017-11-26 NOTE — ED Triage Notes (Signed)
Pt reports starting her menses this a.m. And started having "period cramps" in her RLQ/right pelvis and then radiated into her right low back and down her right leg. Hx of kidney stones. Some nausea.

## 2017-11-26 NOTE — ED Triage Notes (Signed)
Sent from [facility] for abd pain rlq.  Says crampy pain.

## 2017-11-26 NOTE — Discharge Instructions (Signed)
Go straight to the ER.  I hope you feel better.  Take care  Dr. Adriana Simasook

## 2017-11-26 NOTE — ED Notes (Signed)
E-signature box not working. Pt verbalized understanding of discharge instructions and denied questions. 

## 2017-11-26 NOTE — ED Provider Notes (Signed)
[facility]    CSN: [id number] Arrival date & time: 11/26/17  1028  History   Chief Complaint Chief Complaint  Patient presents with  . Abdominal Pain   HPI  37 year old female with a hx of nephrolithiasis and dysmenorrhea presents with right lower quadrant pain.  Patient states that she is currently on her menstrual cycle.  She woke this morning with severe right lower quadrant pain.  States that is a severe crampy sensation.  Patient has a long history of dysmenorrhea.  Additionally, she has a history of nephrolithiasis.  She states that she is having associated low back pain pain radiating down her leg.  She reports nausea with several episodes of emesis this morning.  Her pain is 9/10 in severity currently.  She appears distressed.  No reports of fevers or chills.  She cannot get comfortable.  No known relieving factors. No other reported symptoms.  No other complaints or concerns at this time.  Past Medical History:  Diagnosis Date  . Kidney stones   . Migraine    Patient Active Problem List   Diagnosis Date Noted  . Essential hypertension 07/15/2017  . Gastroesophageal reflux disease without esophagitis 12/18/2016  . RUQ abdominal pain 12/18/2016  . 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 Dysmenorrhea 06/04/2015  . Migraine without aura and responsive to treatment 06/04/2015  . 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 DILATION AND CURETTAGE OF UTERUS    . TUBAL LIGATION Bilateral 2007   OB History    No data available     Home Medications    Prior to Admission medications   Medication Sig Start Date End Date Taking? Authorizing Provider  ibuprofen (ADVIL,MOTRIN) 800 MG tablet Take 1 tablet (800 mg total) every 8 (eight) hours as needed by mouth. 09/28/17   Reubin MilanBerglund, Laura H, MD  lisinopril (PRINIVIL,ZESTRIL) 20 MG tablet Take 1 tablet (20 mg total) daily by mouth. 09/28/17   Reubin MilanBerglund, Laura H, MD  metoprolol  succinate (TOPROL-XL) 50 MG 24 hr tablet TAKE 1 TABLET BY MOUTH ONCE DAILY TAKE  WITH  OR  IMMEDIATELY  FOLLOWING  A  MEAL 10/15/17   Reubin MilanBerglund, Laura H, MD    Family History Family History  Problem Relation Age of Onset  . Healthy Mother   . Healthy Father     Social History Social History   Tobacco Use  . Smoking status: Current Every Day Smoker    Packs/day: 0.25    Types: Cigarettes  . Smokeless tobacco: Never Used  Substance Use Topics  . Alcohol use: No    Alcohol/week: 0.0 oz  . Drug use: No     Allergies   Patient has no known allergies.   Review of Systems Review of Systems  Constitutional: Negative for fever.  Gastrointestinal: Positive for abdominal pain, nausea and vomiting.   Physical Exam Triage Vital Signs ED Triage Vitals  Enc Vitals Group     BP 11/26/17 1037 (S) 134/88     Pulse Rate 11/26/17 1037 98     Resp 11/26/17 1037 20     Temp 11/26/17 1037 98.2 F (36.8 C)     Temp Source 11/26/17 1037 Oral     SpO2 11/26/17 1037 98 %     Weight 11/26/17 1038 172 lb 6.4 oz (78.2 kg)     Height 11/26/17 1036 5' (1.524 m)     Head Circumference --      Peak Flow --  Pain Score 11/26/17 1038 10     Pain Loc --      Pain Edu? --      Excl. in GC? --    Updated Vital Signs BP (S) 134/88 (BP Location: Left Arm) Comment: has not taken B/P meds yet today  Pulse 98   Temp 98.2 F (36.8 C) (Oral)   Resp 20   Ht 5' (1.524 m)   Wt 172 lb 6.4 oz (78.2 kg)   LMP 11/26/2017   SpO2 98%   BMI 33.67 kg/m     Physical Exam  Constitutional: She is oriented to person, place, and time. She appears well-developed and well-nourished.  Patient visibly distressed secondary to pain.  HENT:  Head: Normocephalic and atraumatic.  Eyes: Conjunctivae are normal. No scleral icterus.  Cardiovascular: Normal rate and regular rhythm.  Pulmonary/Chest: Effort normal and breath sounds normal. She has no wheezes. She has no rales.  Abdominal: Soft. She exhibits no  distension.  Patient with severe right lower quadrant tenderness to palpation. ? Obturator sign. Negative Psoas sign.  Neurological: She is alert and oriented to person, place, and time.  Psychiatric: She has a normal mood and affect. Her behavior is normal.  Nursing note and vitals reviewed.  UC Treatments / Results  Labs (all labs ordered are listed, but only abnormal results are displayed) Labs Reviewed  URINALYSIS, COMPLETE (UACMP) WITH MICROSCOPIC    EKG  EKG Interpretation None       Radiology No results found.  Procedures Procedures (including critical care time)  Medications Ordered in UC Medications  ondansetron (ZOFRAN-ODT) disintegrating tablet 8 mg (8 mg Oral Given 11/26/17 1056)     Initial Impression / Assessment and Plan / UC Course  I have reviewed the triage vital signs and the nursing notes.  Pertinent labs & imaging results that were available during my care of the patient were reviewed by me and considered in my medical decision making (see chart for details).     37 year old female presents with severe right lower quadrant pain.  Patient in obvious distress.  Zofran given for nausea.  I do not have CT imaging available currently.  As a result of this and her clinical picture, I am sending her directly to the hospital for evaluation and treatment.  Final Clinical Impressions(s) / UC Diagnoses   Final diagnoses:  RLQ abdominal pain    ED Discharge Orders    None      Controlled Substance Prescriptions [place] Controlled Substance Registry consulted? Not Applicable   Tommie Sams, DO 11/26/17 1104

## 2017-11-26 NOTE — ED Provider Notes (Signed)
[facility] Emergency Department Provider Note       Time seen: ----------------------------------------- 12:17 PM on 11/26/2017 -----------------------------------------   I have reviewed the triage vital signs and the nursing notes.  HISTORY   Chief Complaint Abdominal Pain    HPI Amy Gomez is a 37 y.o. female with a history of renal colic, migraines, ovarian cysts who presents to the ED for right lower quadrant pain.  Patient reports pain 10 out of 10 in the right flank.  Patient states it feels like dysmenorrhea but worse than normal.  She has also had a history of ovarian cyst.  She states the pain is cramping, nothing makes it better.  Past Medical History:  Diagnosis Date  . Kidney stones   . Migraine     Patient Active Problem List   Diagnosis Date Noted  . Essential hypertension 07/15/2017  . Gastroesophageal reflux disease without esophagitis 12/18/2016  . RUQ abdominal pain 12/18/2016  . 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 Dysmenorrhea 06/04/2015  . Migraine without aura and responsive to treatment 06/04/2015  . 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 DILATION AND CURETTAGE OF UTERUS    . TUBAL LIGATION Bilateral 2007    Allergies Patient has no known allergies.  Social History Social History   Tobacco Use  . Smoking status: Current Every Day Smoker    Packs/day: 0.25    Types: Cigarettes  . Smokeless tobacco: Never Used  Substance Use Topics  . Alcohol use: No    Alcohol/week: 0.0 oz  . Drug use: No    Review of Systems Constitutional: Negative for fever. Cardiovascular: Negative for chest pain. Respiratory: Negative for shortness of breath. Gastrointestinal: Positive for abdominal pain, vomiting Genitourinary: Negative for dysuria. positive for vaginal bleeding Musculoskeletal: Negative for back pain. Skin: Negative for rash. Neurological: Negative for  headaches, focal weakness or numbness.  All systems negative/normal/unremarkable except as stated in the HPI  ____________________________________________   PHYSICAL EXAM:  VITAL SIGNS: ED Triage Vitals  Enc Vitals Group     BP 11/26/17 1149 (!) 131/96     Pulse Rate 11/26/17 1149 80     Resp 11/26/17 1149 18     Temp 11/26/17 1149 98.8 F (37.1 C)     Temp Source 11/26/17 1149 Oral     SpO2 11/26/17 1149 98 %     Weight 11/26/17 1136 172 lb (78 kg)     Height 11/26/17 1136 5' (1.524 m)     Head Circumference --      Peak Flow --      Pain Score 11/26/17 1136 10     Pain Loc --      Pain Edu? --      Excl. in GC? --     Constitutional: Alert and oriented.  Mild distress Eyes: Conjunctivae are normal. Normal extraocular movements. ENT   Head: Normocephalic and atraumatic.   Nose: No congestion/rhinnorhea.   Mouth/Throat: Mucous membranes are moist.   Neck: No stridor. Cardiovascular: Normal rate, regular rhythm. No murmurs, rubs, or gallops. Respiratory: Normal respiratory effort without tachypnea nor retractions. Breath sounds are clear and equal bilaterally. No wheezes/rales/rhonchi. Gastrointestinal: Right lower quadrant tenderness, no rebound or guarding.  Normal bowel sounds. Musculoskeletal: Nontender with normal range of motion in extremities. No lower extremity tenderness nor edema. Neurologic:  Normal speech and language. No gross focal neurologic deficits are appreciated.  Skin:  Skin is warm, dry and intact.  No rash noted. Psychiatric: Mood and affect are normal. Speech and behavior are normal.  ____________________________________________  ED COURSE:  As part of my medical decision making, I reviewed the following data within the electronic MEDICAL RECORD NUMBER History obtained from family if available, nursing notes, old chart and ekg, as well as notes from prior ED visits. Patient presented for right lower quadrant pain, we will assess with labs  and imaging as indicated at this time.   Procedures ____________________________________________   LABS (pertinent positives/negatives)  Labs Reviewed  COMPREHENSIVE METABOLIC PANEL - Abnormal; Notable for the following components:      Result Value   Glucose, Bld 107 (*)    All other components within normal limits  CBC - Abnormal; Notable for the following components:   WBC 14.2 (*)    All other components within normal limits  URINALYSIS, COMPLETE (UACMP) WITH MICROSCOPIC - Abnormal; Notable for the following components:   Color, Urine RED (*)    APPearance CLOUDY (*)    Hgb urine dipstick LARGE (*)    Protein, ur 100 (*)    Squamous Epithelial / LPF 6-30 (*)    All other components within normal limits  CHLAMYDIA/NGC RT PCR ([facility] ONLY)  LIPASE, BLOOD  POC URINE PREG, ED  POCT PREGNANCY, URINE    RADIOLOGY Images were viewed by me  Pelvic ultrasound  IMPRESSION: Unremarkable pelvic ultrasound. Specifically, no evidence for ovarian torsion. CT renal protocol did not reveal any acute process ____________________________________________  DIFFERENTIAL DIAGNOSIS   UTI, pyelonephritis, renal colic, PID, ectopic, appendicitis  FINAL ASSESSMENT AND PLAN  Flank pain   Plan: Patient had presented for flank pain. Patient's labs were reassuring. Patient's imaging was also reassuring.  No clear etiology for her symptoms.  She is cleared for outpatient follow-up.   Emily Filbert, MD   Note: This note was generated in part or whole with voice recognition software. Voice recognition is usually quite accurate but there are transcription errors that can and very often do occur. I apologize for any typographical errors that were not detected and corrected.     Emily Filbert, MD 11/26/17 1351

## 2017-12-02 ENCOUNTER — Encounter: Payer: Self-pay | Admitting: Internal Medicine

## 2017-12-02 ENCOUNTER — Ambulatory Visit: Payer: Medicaid Other | Admitting: Internal Medicine

## 2017-12-02 VITALS — BP 128/86 | HR 72 | Ht 60.0 in | Wt 172.0 lb

## 2017-12-02 DIAGNOSIS — I10 Essential (primary) hypertension: Secondary | ICD-10-CM

## 2017-12-02 DIAGNOSIS — R102 Pelvic and perineal pain: Secondary | ICD-10-CM | POA: Diagnosis not present

## 2017-12-02 NOTE — Progress Notes (Signed)
Date:  12/02/2017   Name:  Amy Gomez   DOB:  [birth year]   MRN:  [id number]   Chief Complaint: Follow-up (ER- last Thursday- had CT scan and was bleeding- didn't find anything and was sent home)  Hypertension  This is a chronic problem. The current episode started more than 1 month ago. The problem has been waxing and waning since onset. Pertinent negatives include no chest pain, headaches, palpitations or shortness of breath. Past treatments include beta blockers and ACE inhibitors. The current treatment provides moderate improvement.   Renal stones - bilateral small renal stones noted, no ureteral stones, stable benign renal cyst and small amount of fluid in the cul de sac. Still having abdominal pain despite advil and tramadol but it is much less.  Taking advil 800 mg bid. Pain is located in the right pelvic region.  She is on her menses now.    Review of Systems  Constitutional: Negative for chills, fatigue and fever.  Respiratory: Negative for chest tightness, shortness of breath and wheezing.   Cardiovascular: Negative for chest pain and palpitations.  Gastrointestinal: Positive for abdominal pain. Negative for blood in stool, constipation and diarrhea.  Genitourinary: Positive for menstrual problem. Negative for dysuria.  Neurological: Negative for dizziness and headaches.    Patient Active Problem List   Diagnosis Date Noted  . Essential hypertension 07/15/2017  . Gastroesophageal reflux disease without esophagitis 12/18/2016  . RUQ abdominal pain 12/18/2016  . 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 Dysmenorrhea 06/04/2015  . Migraine without aura and responsive to treatment 06/04/2015  . 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 Authorizing Provider  ibuprofen (ADVIL,MOTRIN) 800 MG tablet Take 1 tablet (800 mg total) every 8 (eight) hours as needed by mouth. 09/28/17  Yes Reubin Milan, MD    traMADol (ULTRAM) 50 MG tablet Take 1 tablet (50 mg total) by mouth every 6 (six) hours as needed. 11/26/17 11/26/18 Yes Emily Filbert, MD  lisinopril (PRINIVIL,ZESTRIL) 20 MG tablet Take 1 tablet (20 mg total) daily by mouth. Patient not taking: Reported on 12/02/2017 09/28/17   Reubin Milan, MD  metoprolol succinate (TOPROL-XL) 50 MG 24 hr tablet TAKE 1 TABLET BY MOUTH ONCE DAILY TAKE  WITH  OR  IMMEDIATELY  FOLLOWING  A  MEAL Patient not taking: Reported on 12/02/2017 10/15/17   Reubin Milan, MD    No Known Allergies  Past Surgical History:  Procedure Laterality Date  . DILATION AND CURETTAGE OF UTERUS    . TUBAL LIGATION Bilateral 2007    Social History   Tobacco Use  . Smoking status: Current Every Day Smoker    Packs/day: 0.25    Types: Cigarettes  . Smokeless tobacco: Never Used  Substance Use Topics  . Alcohol use: No    Alcohol/week: 0.0 oz  . Drug use: No     Medication list has been reviewed and updated.  PHQ 2/9 Scores 07/15/2017  PHQ - 2 Score 0    Physical Exam  Constitutional: She is oriented to person, place, and time. She appears well-developed. No distress.  HENT:  Head: Normocephalic and atraumatic.  Cardiovascular: Normal rate, regular rhythm and normal heart sounds.  Pulmonary/Chest: Effort normal and breath sounds normal. No respiratory distress. She has no wheezes.  Abdominal: Soft. Normal appearance. Bowel sounds are decreased. There is no hepatosplenomegaly. There is tenderness in the right lower quadrant. There is no  CVA tenderness.  Musculoskeletal: Normal range of motion. She exhibits no edema.  Neurological: She is alert and oriented to person, place, and time.  Skin: Skin is warm and dry. No rash noted.  Psychiatric: She has a normal mood and affect. Her behavior is normal. Thought content normal.  Nursing note and vitals reviewed.   BP 128/86   Pulse 72   Ht 5' (1.524 m)   Wt 172 lb (78 kg)   LMP 11/26/2017   BMI 33.59  kg/m   Assessment and Plan: 1. Pelvic pain in female Suspect ruptured ovarian cyst with small amount of free fluid noted on CT Advil 800 mg tid, heat, tramadol as needed May need GYN evaluation if persistent  2. Essential hypertension Continue same medications   No orders of the defined types were placed in this encounter.   Partially dictated using Animal nutritionistDragon software. Any errors are unintentional.  Bari EdwardLaura Eddison Searls, MD [facility]  12/02/2017

## 2018-01-01 ENCOUNTER — Other Ambulatory Visit: Payer: Self-pay | Admitting: Internal Medicine

## 2018-01-04 ENCOUNTER — Ambulatory Visit: Payer: Self-pay | Admitting: Internal Medicine

## 2018-01-06 ENCOUNTER — Ambulatory Visit: Payer: Self-pay | Admitting: Internal Medicine

## 2018-02-03 IMAGING — US US TRANSVAGINAL NON-OB
1 series · 13 of 25 positions shown · non-contrast
Comparison: None.

CLINICAL DATA: Pelvic pain since [DATE] a.m. this morning. History of
tubal ligation.

EXAM:
TRANSABDOMINAL AND TRANSVAGINAL ULTRASOUND OF PELVIS
DOPPLER ULTRASOUND OF OVARIES
TECHNIQUE: Both transabdominal and transvaginal ultrasound examinations of the
pelvis were performed. Transabdominal technique was performed for
global imaging of the pelvis including uterus, ovaries, adnexal
regions, and pelvic cul-de-sac.
It was necessary to proceed with endovaginal exam following the
transabdominal exam to visualize the endometrium. Color and duplex
Doppler ultrasound was utilized to evaluate blood flow to the
ovaries.

[Series 1: us transvaginal non-ob · 0.20mm/px · 13 of 96 slices shown]
[im 1/96]
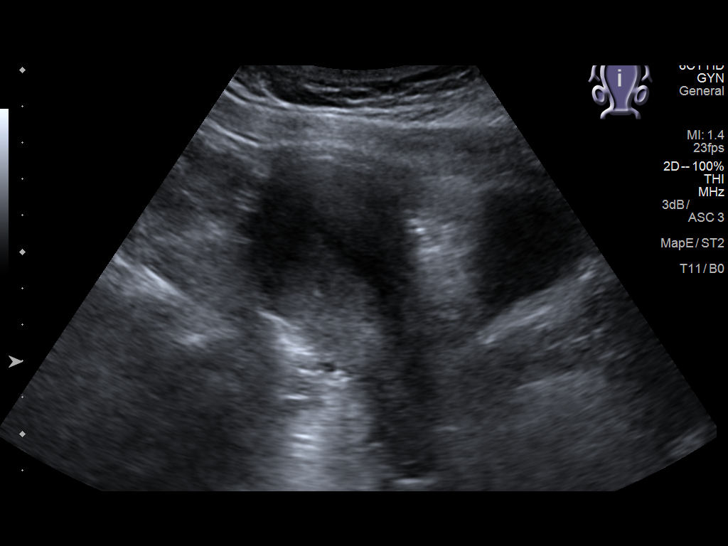
[im 8/96]
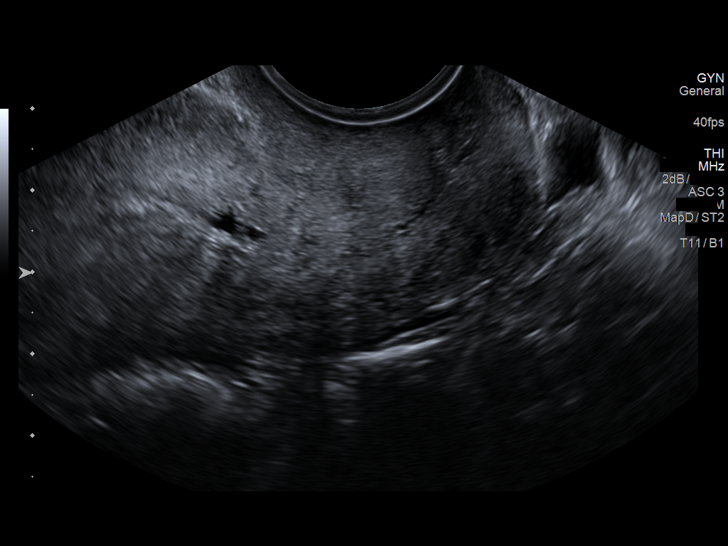
[im 16/96]
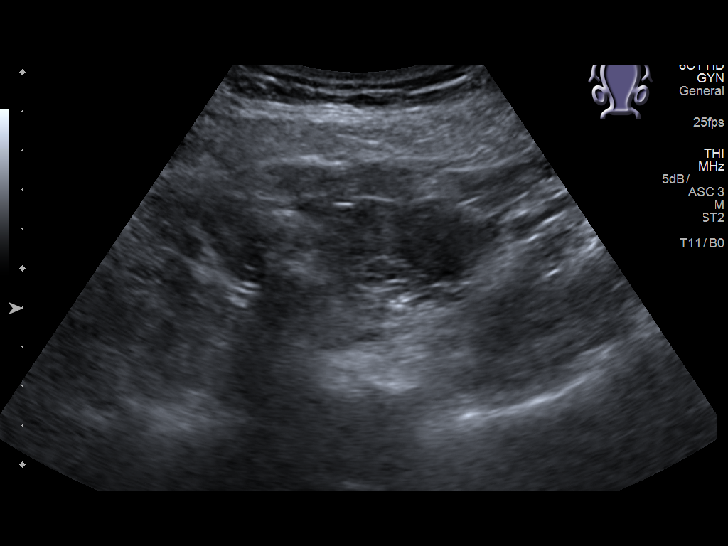
[im 24/96]
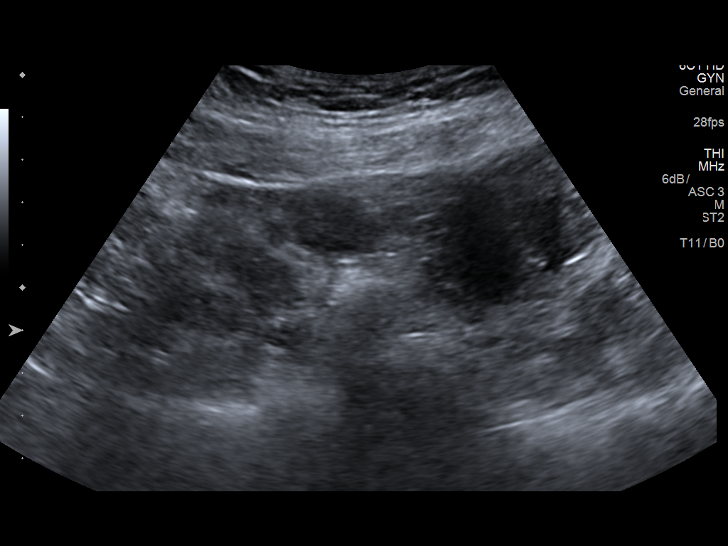
[im 32/96]
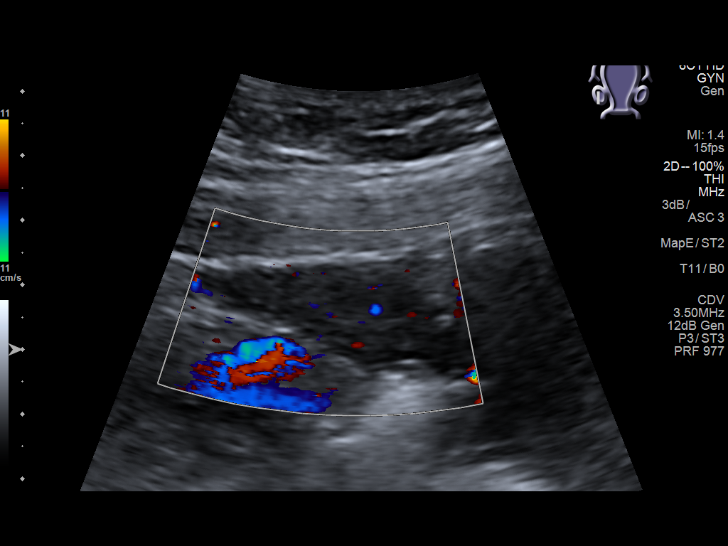
[im 40/96]
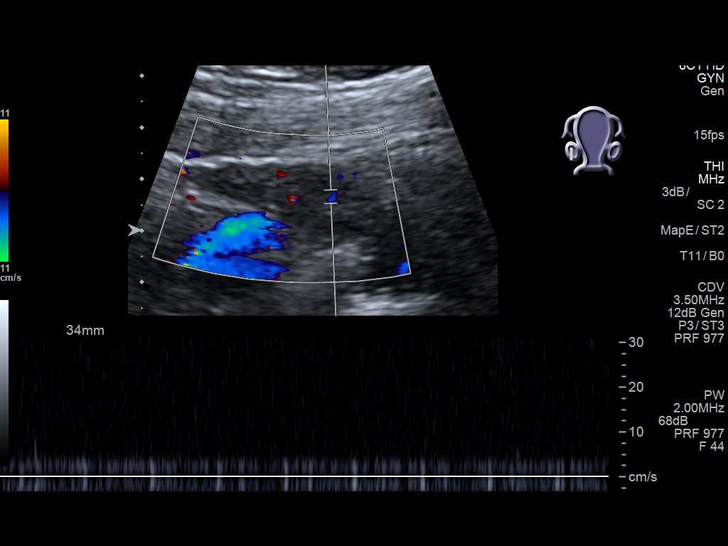
[im 48/96]
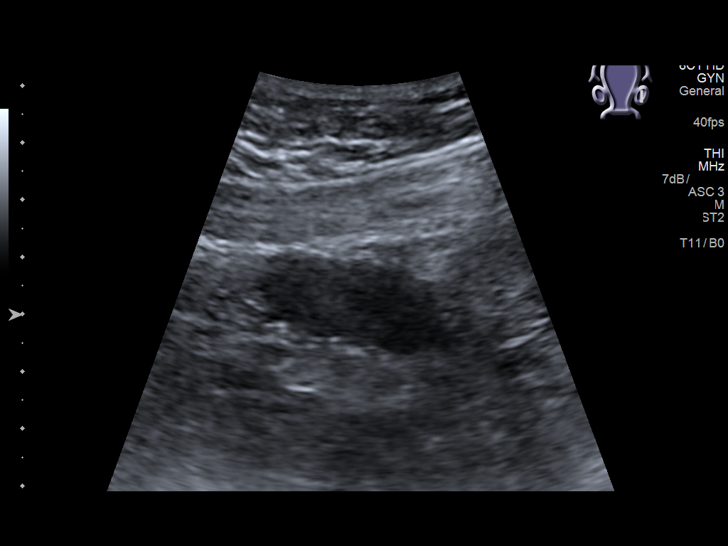
[im 56/96]
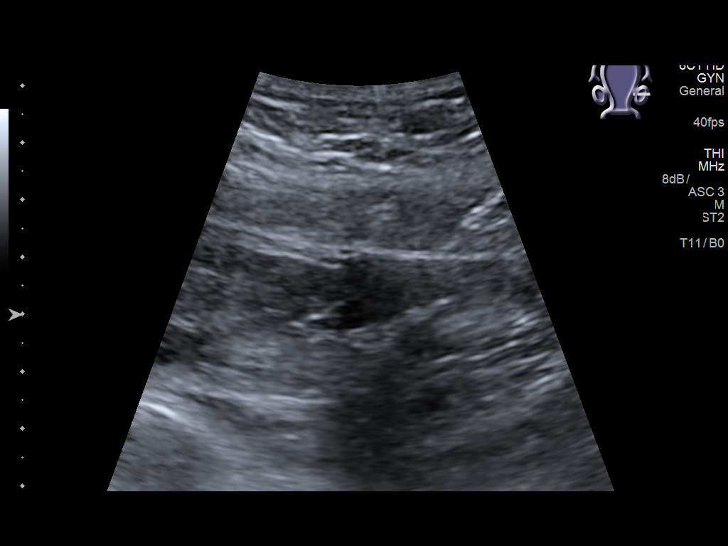
[im 64/96]
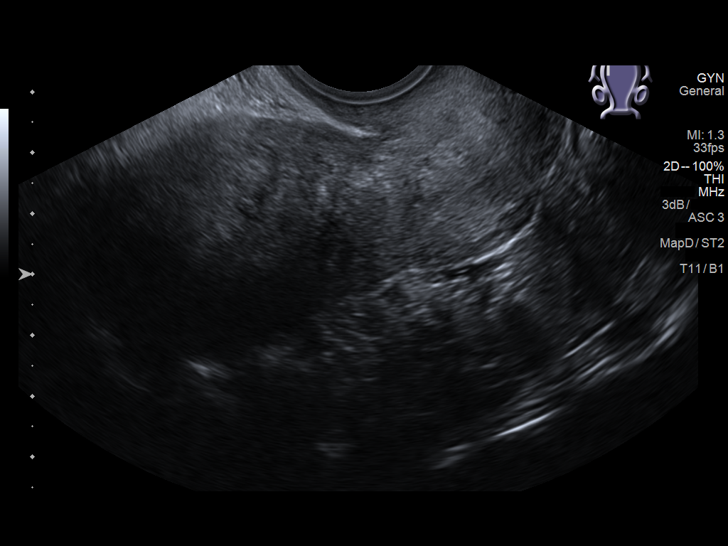
[im 72/96]
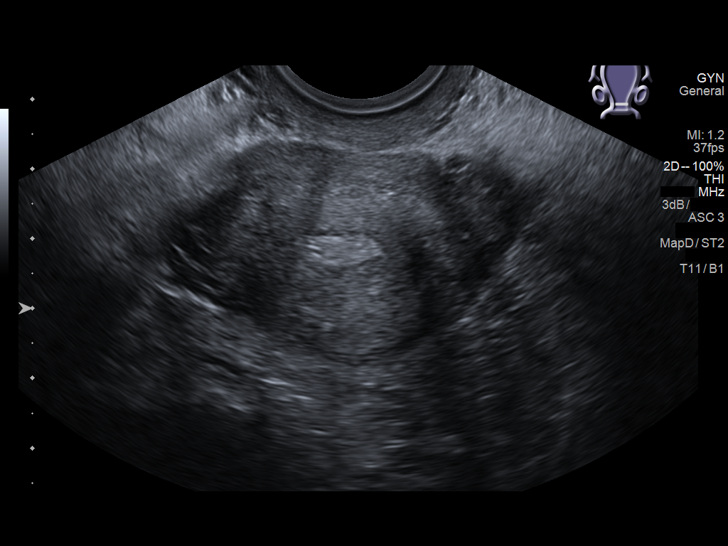
[im 80/96]
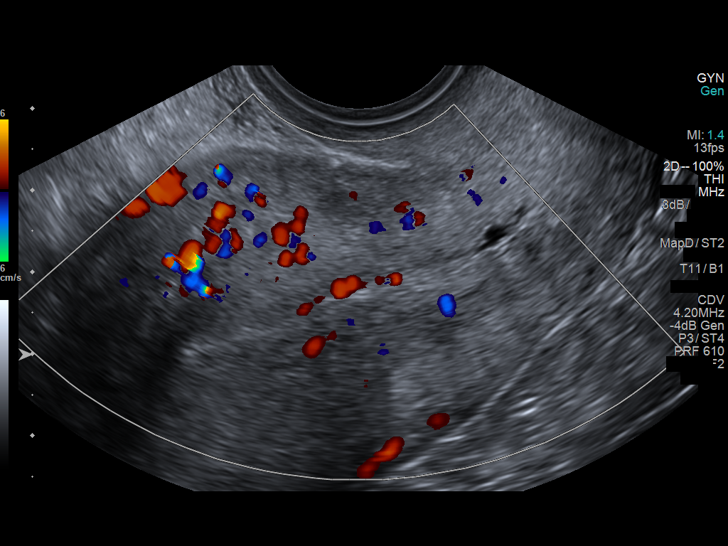
[im 88/96]
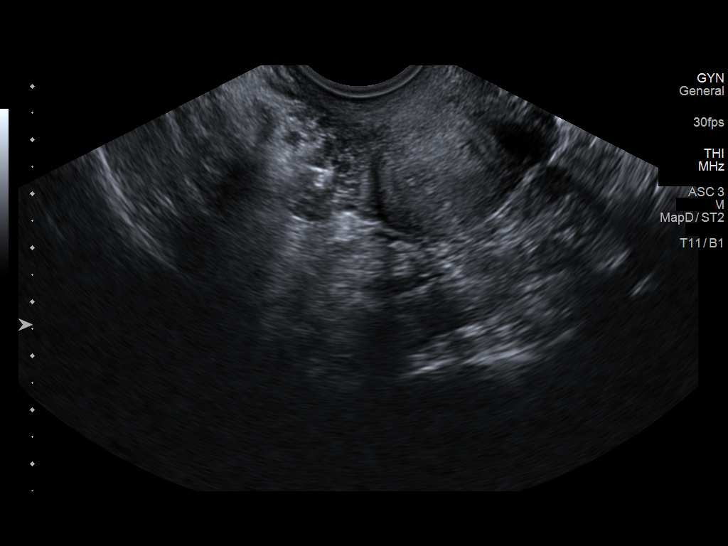
[im 96/96]
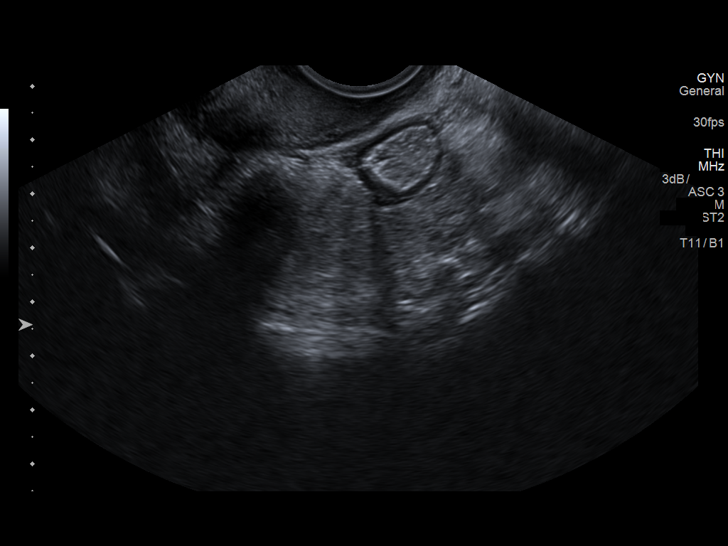

[13 of 25 positions shown; findings below may reference images not displayed]

FINDINGS: Uterus

Measurements: 8.7 x 4.4 x 5.6 cm. No fibroids or other mass
visualized.

Endometrium

Thickness: 11 mm.  No focal abnormality visualized.

Right ovary

Measurements: 3.4 x 1.4 x 2.2 cm. Normal appearance/no adnexal mass.

Left ovary

Measurements: 3.3 x 1.6 x 1.9 cm. Normal appearance/no adnexal mass.

Pulsed Doppler evaluation of both ovaries demonstrates normal
low-resistance arterial and venous waveforms.

Other findings

Very trace free fluid noted in the pelvis.
IMPRESSION: Unremarkable pelvic ultrasound. Specifically, no evidence for
ovarian torsion.

## 2018-02-09 ENCOUNTER — Telehealth: Payer: Self-pay

## 2018-02-09 ENCOUNTER — Other Ambulatory Visit: Payer: Self-pay | Admitting: Internal Medicine

## 2018-02-09 MED ORDER — TRAMADOL HCL 50 MG PO TABS: 50.0000 mg | ORAL_TABLET | Freq: Three times a day (TID) | ORAL | 0 refills | Status: DC | PRN

## 2018-02-09 NOTE — Telephone Encounter (Signed)
Patient called to request refill on tramadol medication. Please send to [facility] in [place].   Thank you.

## 2018-03-30 ENCOUNTER — Ambulatory Visit (INDEPENDENT_AMBULATORY_CARE_PROVIDER_SITE_OTHER): Payer: Medicaid Other | Admitting: Internal Medicine

## 2018-03-30 ENCOUNTER — Encounter: Payer: Self-pay | Admitting: Internal Medicine

## 2018-03-30 VITALS — BP 126/86 | HR 95 | Resp 16 | Ht 60.0 in | Wt 174.0 lb

## 2018-03-30 DIAGNOSIS — I10 Essential (primary) hypertension: Secondary | ICD-10-CM

## 2018-03-30 DIAGNOSIS — G43009 Migraine without aura, not intractable, without status migrainosus: Secondary | ICD-10-CM | POA: Diagnosis not present

## 2018-03-30 DIAGNOSIS — N23 Unspecified renal colic: Secondary | ICD-10-CM | POA: Diagnosis not present

## 2018-03-30 DIAGNOSIS — N3000 Acute cystitis without hematuria: Secondary | ICD-10-CM | POA: Diagnosis not present

## 2018-03-30 LAB — POC URINALYSIS WITH MICROSCOPIC (NON AUTO)MANUAL RESULT
Bilirubin, UA: NEGATIVE
Blood, UA: NEGATIVE
CRYSTALS: 0
EPITHELIAL CELLS, URINE PER MICROSCOPY: 0
Glucose, UA: NEGATIVE
Ketones, UA: NEGATIVE
Leukocytes, UA: NEGATIVE
MUCUS UA: 0
Nitrite, UA: NEGATIVE
PH UA: 7.5 (ref 5.0–8.0)
PROTEIN UA: NEGATIVE
RBC: 0 M/uL — AB (ref 4.04–5.48)
Spec Grav, UA: 1.02 (ref 1.010–1.025)
Urobilinogen, UA: 0.2 E.U./dL
WBC Casts, UA: 0

## 2018-03-30 MED ORDER — TRAMADOL HCL 50 MG PO TABS: 50.0000 mg | ORAL_TABLET | Freq: Three times a day (TID) | ORAL | 0 refills | Status: DC | PRN

## 2018-03-30 MED ORDER — METOPROLOL SUCCINATE ER 50 MG PO TB24: 50.0000 mg | ORAL_TABLET | Freq: Every day | ORAL | 5 refills | Status: DC

## 2018-03-30 MED ORDER — CEPHALEXIN 500 MG PO CAPS: 500.0000 mg | ORAL_CAPSULE | Freq: Four times a day (QID) | ORAL | 0 refills | Status: AC

## 2018-03-30 MED ORDER — TOPIRAMATE 50 MG PO TABS: 100.0000 mg | ORAL_TABLET | Freq: Every day | ORAL | 5 refills | Status: DC

## 2018-03-30 MED ORDER — LISINOPRIL 20 MG PO TABS: 20.0000 mg | ORAL_TABLET | Freq: Every day | ORAL | 5 refills | Status: DC

## 2018-03-30 NOTE — Progress Notes (Signed)
Date:  03/30/2018   Name:  Amy Gomez   DOB:  [birth year]   MRN:  [id number]   Chief Complaint: Abdominal Pain (right side pain since Sunday feels like when she had stones ) Urinary Tract Infection   This is a new problem. The quality of the pain is described as burning. There has been no fever. Associated symptoms include flank pain and nausea. Pertinent negatives include no chills or vomiting.  Migraine   This is a recurrent problem. The problem occurs intermittently (3-4 days a week and all week during her menses). The problem has been waxing and waning. The pain quality is similar to prior headaches. Associated symptoms include hearing loss, nausea, photophobia and a visual change. Pertinent negatives include no coughing, dizziness, fever, vomiting or weakness. The symptoms are aggravated by menstrual cycle and weather changes. Her past medical history is significant for hypertension.  Hypertension  This is a chronic problem. The problem is controlled. Associated symptoms include headaches. Pertinent negatives include no chest pain, palpitations or shortness of breath. Past treatments include beta blockers and ACE inhibitors.      Review of Systems  Constitutional: Negative for chills, fatigue and fever.  HENT: Positive for hearing loss.   Eyes: Positive for photophobia.  Respiratory: Negative for cough, chest tightness and shortness of breath.   Cardiovascular: Negative for chest pain, palpitations and leg swelling.  Gastrointestinal: Positive for nausea. Negative for vomiting.  Genitourinary: Positive for flank pain and pelvic pain. Negative for dysuria.  Allergic/Immunologic: Negative for environmental allergies.  Neurological: Positive for headaches. Negative for dizziness, tremors and weakness.    Patient Active Problem List   Diagnosis Date Noted  . Essential hypertension 07/15/2017  . Gastroesophageal reflux disease without esophagitis 12/18/2016  . RUQ abdominal  pain 12/18/2016  . 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 Dysmenorrhea 06/04/2015  . Migraine without aura and responsive to treatment 06/04/2015  . 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 Authorizing Provider  ibuprofen (ADVIL,MOTRIN) 800 MG tablet Take 1 tablet (800 mg total) every 8 (eight) hours as needed by mouth. 09/28/17  Yes Reubin Milan, MD  lisinopril (PRINIVIL,ZESTRIL) 20 MG tablet Take 1 tablet (20 mg total) daily by mouth. 09/28/17  Yes Reubin Milan, MD  metoprolol succinate (TOPROL-XL) 50 MG 24 hr tablet TAKE 1 TABLET BY MOUTH ONCE DAILY TAKE  WITH  OR  IMMEDIATELY  FOLLOWING  A  MEAL 10/15/17  Yes Reubin Milan, MD  traMADol (ULTRAM) 50 MG tablet Take 1 tablet (50 mg total) by mouth every 8 (eight) hours as needed. 02/09/18  Yes Reubin Milan, MD    Allergies  Allergen Reactions  . Sulfa Antibiotics Rash and Other (See Comments)    Past Surgical History:  Procedure Laterality Date  . DILATION AND CURETTAGE OF UTERUS    . TUBAL LIGATION Bilateral 2007    Social History   Tobacco Use  . Smoking status: Current Every Day Smoker    Packs/day: 0.25    Types: Cigarettes  . Smokeless tobacco: Never Used  Substance Use Topics  . Alcohol use: No    Alcohol/week: 0.0 oz  . Drug use: No     Medication list has been reviewed and updated.  PHQ 2/9 Scores 07/15/2017  PHQ - 2 Score 0    Physical Exam  Constitutional: She is oriented to person, place, and time. She appears well-developed. No distress.  HENT:  Head: Normocephalic and atraumatic.  Pulmonary/Chest: Effort normal. No respiratory distress.  Abdominal: Normal appearance and bowel sounds are normal. There is tenderness in the suprapubic area. There is CVA tenderness. There is no rigidity and no guarding.  Musculoskeletal: Normal range of motion.  Neurological: She is alert and oriented to person, place, and time.  Skin: Skin is  warm and dry. No rash noted.  Psychiatric: She has a normal mood and affect. Her behavior is normal. Thought content normal.  Nursing note and vitals reviewed.   BP 126/86   Pulse 95   Resp 16   Ht 5' (1.524 m)   Wt 174 lb (78.9 kg)   LMP 03/16/2018   SpO2 97%   BMI 33.98 kg/m   Assessment and Plan: 1. Acute cystitis without hematuria Cover for subacute infection - cephALEXin (KEFLEX) 500 MG capsule; Take 1 capsule (500 mg total) by mouth 4 (four) times daily for 3 days.  Dispense: 12 capsule; Refill: 0  2. Renal colic on right side Suspect she is passing a small stone - POC urinalysis w microscopic (non auto)  3. Migraine without aura and responsive to treatment Will try topamax again Continue tramadol PRN - topiramate (TOPAMAX) 50 MG tablet; Take 2 tablets (100 mg total) by mouth at bedtime.  Dispense: 60 tablet; Refill: 5 - traMADol (ULTRAM) 50 MG tablet; Take 1 tablet (50 mg total) by mouth every 8 (eight) hours as needed.  Dispense: 30 tablet; Refill: 0  4. Essential hypertension controlled - metoprolol succinate (TOPROL-XL) 50 MG 24 hr tablet; Take 1 tablet (50 mg total) by mouth daily. Take with or immediately following a meal.  Dispense: 30 tablet; Refill: 5 - lisinopril (PRINIVIL,ZESTRIL) 20 MG tablet; Take 1 tablet (20 mg total) by mouth daily.  Dispense: 30 tablet; Refill: 5   Meds ordered this encounter  Medications  . topiramate (TOPAMAX) 50 MG tablet    Sig: Take 2 tablets (100 mg total) by mouth at bedtime.    Dispense:  60 tablet    Refill:  5  . traMADol (ULTRAM) 50 MG tablet    Sig: Take 1 tablet (50 mg total) by mouth every 8 (eight) hours as needed.    Dispense:  30 tablet    Refill:  0  . cephALEXin (KEFLEX) 500 MG capsule    Sig: Take 1 capsule (500 mg total) by mouth 4 (four) times daily for 3 days.    Dispense:  12 capsule    Refill:  0  . metoprolol succinate (TOPROL-XL) 50 MG 24 hr tablet    Sig: Take 1 tablet (50 mg total) by mouth daily.  Take with or immediately following a meal.    Dispense:  30 tablet    Refill:  5    Please consider 90 day supplies to promote better adherence  . lisinopril (PRINIVIL,ZESTRIL) 20 MG tablet    Sig: Take 1 tablet (20 mg total) by mouth daily.    Dispense:  30 tablet    Refill:  5    Partially dictated using Animal nutritionist. Any errors are unintentional.  Bari Edward, MD [facility]  03/30/2018

## 2018-05-14 ENCOUNTER — Other Ambulatory Visit: Payer: Self-pay | Admitting: Internal Medicine

## 2018-05-14 ENCOUNTER — Telehealth: Payer: Self-pay

## 2018-05-14 DIAGNOSIS — I10 Essential (primary) hypertension: Secondary | ICD-10-CM

## 2018-05-14 DIAGNOSIS — G43009 Migraine without aura, not intractable, without status migrainosus: Secondary | ICD-10-CM

## 2018-05-14 MED ORDER — LISINOPRIL 20 MG PO TABS: 20.0000 mg | ORAL_TABLET | Freq: Every day | ORAL | 1 refills | Status: DC

## 2018-05-14 MED ORDER — METOPROLOL SUCCINATE ER 50 MG PO TB24: 50.0000 mg | ORAL_TABLET | Freq: Every day | ORAL | 1 refills | Status: DC

## 2018-05-14 MED ORDER — TRAMADOL HCL 50 MG PO TABS: 50.0000 mg | ORAL_TABLET | Freq: Three times a day (TID) | ORAL | 0 refills | Status: DC | PRN

## 2018-05-14 MED ORDER — TOPIRAMATE 50 MG PO TABS: 100.0000 mg | ORAL_TABLET | Freq: Every day | ORAL | 1 refills | Status: DC

## 2018-05-14 NOTE — Telephone Encounter (Signed)
I do not think that there is anything less expensive than the current regimen.  I will send in 90 supply of all four.  I do not have any samples of these medications.

## 2018-05-14 NOTE — Telephone Encounter (Signed)
Patient was advise. 

## 2018-05-14 NOTE — Telephone Encounter (Signed)
Patient called requesting refills on medication for 90 day supply. Patient is needing cheaper medication for her headaches, HTN, and pain medication. Patient stated she lost her [employer] [place] along with other financial expenses. Please advise on cheaper option. She has a family member who is willing to purchase medication for 90 day supply. If could offer samples she will greatly appreciate it. Patient is hoping to get insurance in the next few months but just wanting to have enough to get them through.

## 2018-06-13 ENCOUNTER — Encounter: Payer: Self-pay | Admitting: Emergency Medicine

## 2018-06-13 ENCOUNTER — Other Ambulatory Visit: Payer: Self-pay

## 2018-06-13 ENCOUNTER — Emergency Department: Payer: Medicaid Other

## 2018-06-13 ENCOUNTER — Observation Stay
Admission: EM | Admit: 2018-06-13 | Discharge: 2018-06-14 | Disposition: A | Discharge status: Home / Self Care | Payer: Medicaid Other | Attending: Internal Medicine | Admitting: Internal Medicine

## 2018-06-13 DIAGNOSIS — Z23 Encounter for immunization: Secondary | ICD-10-CM | POA: Diagnosis not present

## 2018-06-13 DIAGNOSIS — I10 Essential (primary) hypertension: Secondary | ICD-10-CM | POA: Insufficient documentation

## 2018-06-13 DIAGNOSIS — G43909 Migraine, unspecified, not intractable, without status migrainosus: Secondary | ICD-10-CM | POA: Insufficient documentation

## 2018-06-13 DIAGNOSIS — F17210 Nicotine dependence, cigarettes, uncomplicated: Secondary | ICD-10-CM | POA: Insufficient documentation

## 2018-06-13 DIAGNOSIS — Z87442 Personal history of urinary calculi: Secondary | ICD-10-CM

## 2018-06-13 DIAGNOSIS — Z79899 Other long term (current) drug therapy: Secondary | ICD-10-CM | POA: Diagnosis not present

## 2018-06-13 DIAGNOSIS — N132 Hydronephrosis with renal and ureteral calculous obstruction: Principal | ICD-10-CM | POA: Insufficient documentation

## 2018-06-13 DIAGNOSIS — R160 Hepatomegaly, not elsewhere classified: Secondary | ICD-10-CM | POA: Diagnosis not present

## 2018-06-13 DIAGNOSIS — N23 Unspecified renal colic: Secondary | ICD-10-CM

## 2018-06-13 DIAGNOSIS — R112 Nausea with vomiting, unspecified: Secondary | ICD-10-CM

## 2018-06-13 DIAGNOSIS — N200 Calculus of kidney: Secondary | ICD-10-CM

## 2018-06-13 HISTORY — DX: Essential (primary) hypertension: I10

## 2018-06-13 LAB — URINALYSIS, COMPLETE (UACMP) WITH MICROSCOPIC
Bacteria, UA: NONE SEEN
Bilirubin Urine: NEGATIVE
GLUCOSE, UA: NEGATIVE mg/dL
Ketones, ur: 80 mg/dL — AB
Leukocytes, UA: NEGATIVE
NITRITE: NEGATIVE
PH: 8 (ref 5.0–8.0)
PROTEIN: 30 mg/dL — AB
RBC / HPF: >50 RBC/hpf — ABNORMAL HIGH (ref 0–5)
Specific Gravity, Urine: 1.015 (ref 1.005–1.030)

## 2018-06-13 LAB — COMPREHENSIVE METABOLIC PANEL
ALBUMIN: 4.4 g/dL (ref 3.5–5.0)
ALK PHOS: 68 U/L (ref 38–126)
ALT: 19 U/L (ref 0–44)
AST: 23 U/L (ref 15–41)
Anion gap: 12 (ref 5–15)
BILIRUBIN TOTAL: 0.7 mg/dL (ref 0.3–1.2)
BUN: 10 mg/dL (ref 6–20)
CALCIUM: 9.6 mg/dL (ref 8.9–10.3)
CO2: 19 mmol/L — ABNORMAL LOW (ref 22–32)
Chloride: 108 mmol/L (ref 98–111)
Creatinine, Ser: 0.69 mg/dL (ref 0.44–1.00)
GFR calc Af Amer: >60 mL/min (ref >60)
GFR calc non Af Amer: >60 mL/min (ref >60)
GLUCOSE: 142 mg/dL — AB (ref 70–99)
Potassium: 3.6 mmol/L (ref 3.5–5.1)
Sodium: 139 mmol/L (ref 135–145)
Total Protein: 7.3 g/dL (ref 6.5–8.1)

## 2018-06-13 LAB — LIPASE, BLOOD: Lipase: 59 U/L — ABNORMAL HIGH (ref 11–51)

## 2018-06-13 LAB — CBC
HCT: 44.3 % (ref 35.0–47.0)
Hemoglobin: 15.1 g/dL (ref 12.0–16.0)
MCH: 29.1 pg (ref 26.0–34.0)
MCHC: 34.1 g/dL (ref 32.0–36.0)
MCV: 85.4 fL (ref 80.0–100.0)
Platelets: 229 10*3/uL (ref 150–440)
RBC: 5.19 MIL/uL (ref 3.80–5.20)
RDW: 14.2 % (ref 11.5–14.5)
WBC: 19.7 10*3/uL — ABNORMAL HIGH (ref 3.6–11.0)

## 2018-06-13 LAB — POCT PREGNANCY, URINE: PREG TEST UR: NEGATIVE

## 2018-06-13 MED ORDER — HYDROMORPHONE HCL 1 MG/ML IJ SOLN
0.5000 mg | Freq: Once | INTRAMUSCULAR | Status: AC
  Administered 2018-06-13: 0.5 mg via INTRAVENOUS
  Filled 2018-06-13: qty 1

## 2018-06-13 MED ORDER — PROMETHAZINE HCL 25 MG PO TABS: 25.0000 mg | ORAL_TABLET | ORAL | 1 refills | Status: DC | PRN

## 2018-06-13 MED ORDER — SODIUM CHLORIDE 0.9 % IV SOLN
Freq: Once | INTRAVENOUS | Status: AC
  Administered 2018-06-13: 21:00:00 via INTRAVENOUS

## 2018-06-13 MED ORDER — ONDANSETRON HCL 4 MG/2ML IJ SOLN
4.0000 mg | Freq: Once | INTRAMUSCULAR | Status: AC
  Administered 2018-06-13: 4 mg via INTRAVENOUS
  Filled 2018-06-13: qty 2

## 2018-06-13 MED ORDER — HYDROMORPHONE HCL 1 MG/ML IJ SOLN: 1.0000 mg | INTRAMUSCULAR | Status: DC | PRN

## 2018-06-13 MED ORDER — HYDROMORPHONE HCL 1 MG/ML IJ SOLN
1.0000 mg | Freq: Once | INTRAMUSCULAR | Status: AC
  Administered 2018-06-13: 1 mg via INTRAVENOUS
  Filled 2018-06-13: qty 1

## 2018-06-13 MED ORDER — TAMSULOSIN HCL 0.4 MG PO CAPS: 0.4000 mg | ORAL_CAPSULE | Freq: Once | ORAL | Status: DC

## 2018-06-13 MED ORDER — HEPARIN SODIUM (PORCINE) 5000 UNIT/ML IJ SOLN
5000.0000 [IU] | Freq: Three times a day (TID) | INTRAMUSCULAR | Status: DC
  Administered 2018-06-13 – 2018-06-14 (×2): 5000 [IU] via SUBCUTANEOUS
  Filled 2018-06-13 (×2): qty 1

## 2018-06-13 MED ORDER — SODIUM CHLORIDE 0.9 % IV SOLN
INTRAVENOUS | Status: DC
  Administered 2018-06-13: 23:00:00 via INTRAVENOUS

## 2018-06-13 MED ORDER — TRAMADOL HCL 50 MG PO TABS: 50.0000 mg | ORAL_TABLET | Freq: Three times a day (TID) | ORAL | Status: DC | PRN

## 2018-06-13 MED ORDER — PNEUMOCOCCAL VAC POLYVALENT 25 MCG/0.5ML IJ INJ
0.5000 mL | INJECTION | INTRAMUSCULAR | Status: AC
  Administered 2018-06-14: 0.5 mL via INTRAMUSCULAR
  Filled 2018-06-13: qty 0.5

## 2018-06-13 MED ORDER — OXYCODONE-ACETAMINOPHEN 7.5-325 MG PO TABS: 1.0000 | ORAL_TABLET | ORAL | 0 refills | Status: DC | PRN

## 2018-06-13 MED ORDER — PROMETHAZINE HCL 25 MG/ML IJ SOLN
25.0000 mg | Freq: Once | INTRAMUSCULAR | Status: AC
  Administered 2018-06-13: 25 mg via INTRAVENOUS

## 2018-06-13 MED ORDER — SODIUM CHLORIDE 0.9 % IV SOLN
Freq: Once | INTRAVENOUS | Status: AC
  Administered 2018-06-13: 16:00:00 via INTRAVENOUS

## 2018-06-13 MED ORDER — TOPIRAMATE 100 MG PO TABS
100.0000 mg | ORAL_TABLET | Freq: Every day | ORAL | Status: DC
  Administered 2018-06-13: 100 mg via ORAL
  Filled 2018-06-13 (×2): qty 1

## 2018-06-13 MED ORDER — PROMETHAZINE HCL 25 MG/ML IJ SOLN: 25.0000 mg | Freq: Four times a day (QID) | INTRAMUSCULAR | Status: DC | PRN

## 2018-06-13 MED ORDER — METOPROLOL SUCCINATE ER 50 MG PO TB24
50.0000 mg | ORAL_TABLET | Freq: Every day | ORAL | Status: DC
  Administered 2018-06-13 – 2018-06-14 (×2): 50 mg via ORAL
  Filled 2018-06-13 (×2): qty 1

## 2018-06-13 MED ORDER — LISINOPRIL 20 MG PO TABS
20.0000 mg | ORAL_TABLET | Freq: Every day | ORAL | Status: DC
  Administered 2018-06-13 – 2018-06-14 (×2): 20 mg via ORAL
  Filled 2018-06-13 (×2): qty 1

## 2018-06-13 MED ORDER — PROMETHAZINE HCL 25 MG PO TABS: 25.0000 mg | ORAL_TABLET | Freq: Once | ORAL | Status: DC

## 2018-06-13 MED ORDER — SODIUM CHLORIDE 0.9 % IV SOLN
1.0000 g | Freq: Once | INTRAVENOUS | Status: AC
  Administered 2018-06-13: 1 g via INTRAVENOUS
  Filled 2018-06-13: qty 10

## 2018-06-13 MED ORDER — PROMETHAZINE HCL 25 MG/ML IJ SOLN
INTRAMUSCULAR | Status: AC
  Administered 2018-06-13: 25 mg via INTRAVENOUS
  Filled 2018-06-13: qty 1

## 2018-06-13 MED ORDER — KETOROLAC TROMETHAMINE 30 MG/ML IJ SOLN
30.0000 mg | Freq: Once | INTRAMUSCULAR | Status: AC
  Administered 2018-06-13: 30 mg via INTRAVENOUS
  Filled 2018-06-13: qty 1

## 2018-06-13 MED ORDER — DOCUSATE SODIUM 100 MG PO CAPS: 100.0000 mg | ORAL_CAPSULE | Freq: Two times a day (BID) | ORAL | Status: DC | PRN

## 2018-06-13 NOTE — ED Triage Notes (Signed)
First Nurse Note:  C/O left flank pain and vomiting since this am.  Patient states unable to tolerate PO fluids.  Last void "a while ago".

## 2018-06-13 NOTE — ED Notes (Signed)
Danelle EarthlyNoel RN, is aware of bed assigned

## 2018-06-13 NOTE — ED Triage Notes (Signed)
Pt to ED via POV c/o left flank pain and vomiting since this morning. Pt states that pain has been worse over the past hour. Pt states that she has hx/o kidney stones but that this does not feel like a kidney stone. Pt dry heaving in triage.

## 2018-06-13 NOTE — H&P (Addendum)
[facility] [place] [facility]   PATIENT NAME: Amy Gomez    MR#:  [id number]  DATE OF BIRTH:  01-01-1981  DATE OF ADMISSION:  06/13/2018  PRIMARY CARE PHYSICIAN: Reubin Milan, MD   REQUESTING/REFERRING PHYSICIAN: Mayford Knife  CHIEF COMPLAINT:   Chief Complaint  Patient presents with  . Flank Pain  . Emesis    HISTORY OF PRESENT ILLNESS: Kasondra Junod  is a 37 y.o. female with a known history of retention, kidney stone, migraine-came to emergency room with complaint of severe left-sided lower abdominal pain associated with nausea, fever, chills.  Emergency room she is noted to have a left ureteral stone on CT scan, tried with multiple IV pain and nausea medications to control her symptoms but not well successful. ER physician spoke to urologist and they suggested to continue the same management with IV fluid and IV pain and nausea medications and keep her in the hospital.  PAST MEDICAL HISTORY:   Past Medical History:  Diagnosis Date  . Hypertension   . Kidney stones   . Migraine     PAST SURGICAL HISTORY:  Past Surgical History:  Procedure Laterality Date  . DILATION AND CURETTAGE OF UTERUS    . TUBAL LIGATION Bilateral 2007    SOCIAL HISTORY:  Social History   Tobacco Use  . Smoking status: Current Every Day Smoker    Packs/day: 0.25    Types: Cigarettes  . Smokeless tobacco: Never Used  Substance Use Topics  . Alcohol use: No    Alcohol/week: 0.0 oz    FAMILY HISTORY:  Family History  Problem Relation Age of Onset  . Healthy Mother   . Healthy Father     DRUG ALLERGIES:  Allergies  Allergen Reactions  . Sulfa Antibiotics Anaphylaxis, Swelling and Rash    REVIEW OF SYSTEMS:   CONSTITUTIONAL: No fever, fatigue or weakness.  EYES: No blurred or double vision.  EARS, NOSE, AND THROAT: No tinnitus or ear pain.  RESPIRATORY: No cough, shortness of breath, wheezing or hemoptysis.  CARDIOVASCULAR: No chest pain, orthopnea, edema.   GASTROINTESTINAL: Have nausea, vomiting,no diarrhea, Have abdominal pain.  GENITOURINARY: No dysuria, hematuria.  ENDOCRINE: No polyuria, nocturia,  HEMATOLOGY: No anemia, easy bruising or bleeding SKIN: No rash or lesion. MUSCULOSKELETAL: No joint pain or arthritis.   NEUROLOGIC: No tingling, numbness, weakness.  PSYCHIATRY: No anxiety or depression.   MEDICATIONS AT HOME:  Prior to Admission medications   Medication Sig Start Date End Date Taking? Authorizing Provider  lisinopril (PRINIVIL,ZESTRIL) 20 MG tablet Take 1 tablet (20 mg total) by mouth daily. 05/14/18  Yes Reubin Milan, MD  metoprolol succinate (TOPROL-XL) 50 MG 24 hr tablet Take 1 tablet (50 mg total) by mouth daily. Take with or immediately following a meal. 05/14/18  Yes Reubin Milan, MD  topiramate (TOPAMAX) 50 MG tablet Take 2 tablets (100 mg total) by mouth at bedtime. 05/14/18  Yes Reubin Milan, MD  traMADol (ULTRAM) 50 MG tablet Take 1 tablet (50 mg total) by mouth every 8 (eight) hours as needed. 05/14/18  Yes Reubin Milan, MD  ibuprofen (ADVIL,MOTRIN) 800 MG tablet Take 1 tablet (800 mg total) every 8 (eight) hours as needed by mouth. Patient not taking: Reported on 06/13/2018 09/28/17   Reubin Milan, MD  oxyCODONE-acetaminophen (PERCOCET) 7.5-325 MG tablet Take 1 tablet by mouth every 4 (four) hours as needed for severe pain. 06/13/18 06/13/19  Emily Filbert, MD  promethazine (PHENERGAN) 25 MG tablet  Take 1 tablet (25 mg total) by mouth every 4 (four) hours as needed for nausea or vomiting. 06/13/18   Emily FilbertWilliams, Jonathan E, MD      PHYSICAL EXAMINATION:   VITAL SIGNS: Blood pressure (!) 137/93, pulse 98, temperature 98.1 F (36.7 C), temperature source Oral, resp. rate 16, height 5' (1.524 m), weight 77.1 kg (170 lb), SpO2 100 %.  GENERAL:  37 y.o.-year-old patient lying in the bed with no acute distress.  EYES: Pupils equal, round, reactive to light and accommodation. No scleral icterus.  Extraocular muscles intact.  HEENT: Head atraumatic, normocephalic. Oropharynx and nasopharynx clear.  NECK:  Supple, no jugular venous distention. No thyroid enlargement, no tenderness.  LUNGS: Normal breath sounds bilaterally, no wheezing, rales,rhonchi or crepitation. No use of accessory muscles of respiration.  CARDIOVASCULAR: S1, S2 normal. No murmurs, rubs, or gallops.  ABDOMEN: Soft, left side tender, nondistended. Bowel sounds present. No organomegaly or mass.  EXTREMITIES: No pedal edema, cyanosis, or clubbing.  NEUROLOGIC: Cranial nerves II through XII are intact. Muscle strength 5/5 in all extremities. Sensation intact. Gait not checked.  PSYCHIATRIC: The patient is alert and oriented x 3.  SKIN: No obvious rash, lesion, or ulcer.   LABORATORY PANEL:   CBC Recent Labs  Lab 06/13/18 1546  WBC 19.7*  HGB 15.1  HCT 44.3  PLT 229  MCV 85.4  MCH 29.1  MCHC 34.1  RDW 14.2   ------------------------------------------------------------------------------------------------------------------  Chemistries  Recent Labs  Lab 06/13/18 1546  NA 139  K 3.6  CL 108  CO2 19*  GLUCOSE 142*  BUN 10  CREATININE 0.69  CALCIUM 9.6  AST 23  ALT 19  ALKPHOS 68  BILITOT 0.7   ------------------------------------------------------------------------------------------------------------------ estimated creatinine clearance is 90 mL/min (by C-G formula based on SCr of 0.69 mg/dL). ------------------------------------------------------------------------------------------------------------------ No results for input(s): TSH, T4TOTAL, T3FREE, THYROIDAB in the last 72 hours.  Invalid input(s): FREET3   Coagulation profile No results for input(s): INR, PROTIME in the last 168 hours. ------------------------------------------------------------------------------------------------------------------- No results for input(s): DDIMER in the last 72  hours. -------------------------------------------------------------------------------------------------------------------  Cardiac Enzymes No results for input(s): CKMB, TROPONINI, MYOGLOBIN in the last 168 hours.  Invalid input(s): CK ------------------------------------------------------------------------------------------------------------------ Invalid input(s): POCBNP  ---------------------------------------------------------------------------------------------------------------  Urinalysis    Component Value Date/Time   COLORURINE YELLOW (A) 06/13/2018 1547   APPEARANCEUR TURBID (A) 06/13/2018 1547   APPEARANCEUR Cloudy 03/03/2015 0458   LABSPEC 1.015 06/13/2018 1547   LABSPEC 1.010 03/03/2015 0458   PHURINE 8.0 06/13/2018 1547   GLUCOSEU NEGATIVE 06/13/2018 1547   GLUCOSEU Negative 03/03/2015 0458   HGBUR MODERATE (A) 06/13/2018 1547   BILIRUBINUR NEGATIVE 06/13/2018 1547   BILIRUBINUR neg 03/30/2018 1516   BILIRUBINUR Negative 03/03/2015 0458   KETONESUR 80 (A) 06/13/2018 1547   PROTEINUR 30 (A) 06/13/2018 1547   UROBILINOGEN 0.2 03/30/2018 1516   NITRITE NEGATIVE 06/13/2018 1547   LEUKOCYTESUR NEGATIVE 06/13/2018 1547   LEUKOCYTESUR Trace 03/03/2015 0458     RADIOLOGY: Ct Renal Stone Study  Result Date: 06/13/2018 CLINICAL DATA:  LEFT flank pain and vomiting beginning this morning, worsening for 1 hour. History of kidney stones with dissimilar symptoms. EXAM: CT ABDOMEN AND PELVIS WITHOUT CONTRAST TECHNIQUE: Multidetector CT imaging of the abdomen and pelvis was performed following the standard protocol without IV contrast. COMPARISON:  CT abdomen and pelvis November 26, 2017 and MRI abdomen July 06, 2014 FINDINGS: LOWER CHEST: Lung bases are clear. The visualized heart size is normal. No pericardial effusion. 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 However, a second hypodense  mass segment 5/6 was not present on prior MRI. Normal gallbladder. PANCREAS: Normal. SPLEEN: Normal. ADRENALS/URINARY TRACT: Kidneys are orthotopic, demonstrating normal size and morphology. Bilateral nephrolithiasis measuring to 3 mm. Mild LEFT hydroureteronephrosis to level of the ureterovesicular junction where a 2 mm calculus is present. Limited assessment for renal masses by nonenhanced CT. 2.8 cm cyst LEFT kidney. Urinary bladder is partially distended and unremarkable. Normal adrenal glands. STOMACH/BOWEL: The stomach, small and large bowel are normal in course and caliber without inflammatory changes, sensitivity decreased by lack of enteric contrast. Normal appendix. VASCULAR/LYMPHATIC: Aortoiliac vessels are normal in course and caliber. No lymphadenopathy by CT size criteria. REPRODUCTIVE: Normal. OTHER: Trace free fluid in the pelvis is likely physiologic. No intraperitoneal free air. MUSCULOSKELETAL: Non-acute. Minimal grade 1 L5-S1 anterolisthesis, chronic bilateral L5 pars interarticularis defects. IMPRESSION: 1. 2 mm LEFT ureterovesicular junction calculus resulting in mild obstructive uropathy. 2. Bilateral nephrolithiasis measuring to 3 mm. 3. 4.7 cm mass RIGHT lobe of the liver new from 2015, recommend MRI of the liver with contrast on non emergent basis. 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 This recommendation follows ACR consensus guidelines: Management of Incidental Liver Lesions on CT: A White Paper of the ACR Incidental Findings Committee. J Am Coll Radiol 2017; 16:1096-0454. Electronically Signed   By: Awilda Metro M.D.   On: 06/13/2018 17:37    EKG: No orders found for this or any previous visit.  IMPRESSION AND PLAN:  *Left ureteral stone IV fluids and IV pain and nausea medications. ER physician gave ceftriaxone 1 dose. There is no evidence of UTI so we may finish a short course of oral antibiotic after she passes the stone. If she cannot pass it  successfully, we may need to call urologist to come and see her.  *Hypertension Continue home medication.  *Active smoking Counseled to quit smoking for 4 minutes and offered nicotine patch.  * liver mass   May need work up, can be done as out pt, if pt have a reliable follow ups.  All the records are reviewed and case discussed with ED provider. Management plans discussed with the patient, family and they are in agreement.  CODE STATUS: Full  TOTAL TIME TAKING CARE OF THIS PATIENT: 45 minutes.    Altamese Dilling M.D on 06/13/2018   Between 7am to 6pm - Pager - [phone]  After 6pm go to [email or web address] - password [email or web address]  [facility]  Office  [phone]  CC: Primary care physician; Reubin Milan, MD   Note: This dictation was prepared with Dragon dictation along with smaller phrase technology. Any transcriptional errors that result from this process are unintentional.

## 2018-06-13 NOTE — ED Provider Notes (Addendum)
[facility] Emergency Department Provider Note       Time seen: ----------------------------------------- 4:05 PM on 06/13/2018 -----------------------------------------   I have reviewed the triage vital signs and the nursing notes.  HISTORY   Chief Complaint Flank Pain and Emesis    HPI Amy Gomez is a 37 y.o. female with a history of retention, migraines and kidney stones who presents to the ED for left flank pain and vomiting since this morning.  Patient states pain is been worse over the past hour, states that she has a history of kidney stones but this does not feel like a kidney stone.  She presents dry heaving profusely.  Past Medical History:  Diagnosis Date  . Hypertension   . Kidney stones   . Migraine     Patient Active Problem List   Diagnosis Date Noted  . Essential hypertension 07/15/2017  . Gastroesophageal reflux disease without esophagitis 12/18/2016  . RUQ abdominal pain 12/18/2016  . 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 Dysmenorrhea 06/04/2015  . Migraine without aura and responsive to treatment 06/04/2015  . 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 DILATION AND CURETTAGE OF UTERUS    . TUBAL LIGATION Bilateral 2007    Allergies Sulfa antibiotics  Social History Social History   Tobacco Use  . Smoking status: Current Every Day Smoker    Packs/day: 0.25    Types: Cigarettes  . Smokeless tobacco: Never Used  Substance Use Topics  . Alcohol use: No    Alcohol/week: 0.0 oz  . Drug use: No   Review of Systems Constitutional: Negative for fever. Cardiovascular: Negative for chest pain. Respiratory: Negative for shortness of breath. Gastrointestinal: Positive for flank pain and vomiting Genitourinary: Negative for dysuria. Musculoskeletal: Negative for back pain. Skin: Negative for rash. Neurological: Negative for headaches, focal weakness or numbness.  All  systems negative/normal/unremarkable except as stated in the HPI  ____________________________________________   PHYSICAL EXAM:  VITAL SIGNS: ED Triage Vitals  Enc Vitals Group     BP 06/13/18 1543 (!) 137/93     Pulse Rate 06/13/18 1543 98     Resp 06/13/18 1543 16     Temp 06/13/18 1543 98.1 F (36.7 C)     Temp Source 06/13/18 1543 Oral     SpO2 06/13/18 1543 100 %     Weight 06/13/18 1544 170 lb (77.1 kg)     Height 06/13/18 1544 5' (1.524 m)     Head Circumference --      Peak Flow --      Pain Score 06/13/18 1543 10     Pain Loc --      Pain Edu? --      Excl. in GC? --    Constitutional: Alert and oriented.  Mild distress, patient profusely retching Eyes: Conjunctivae are normal. Normal extraocular movements. Cardiovascular: Normal rate, regular rhythm. No murmurs, rubs, or gallops. Respiratory: Normal respiratory effort without tachypnea nor retractions. Breath sounds are clear and equal bilaterally. No wheezes/rales/rhonchi. Gastrointestinal: Left flank tenderness, no rebound or guarding.  Normal bowel sounds. Musculoskeletal: Nontender with normal range of motion in extremities. No lower extremity tenderness nor edema. Neurologic:  Normal speech and language. No gross focal neurologic deficits are appreciated.  Skin:  Skin is warm, dry and intact. No rash noted. Psychiatric: Mood and affect are normal. Speech and behavior are normal.  ____________________________________________  ED COURSE:  As part of my medical decision making, I reviewed  the following data within the electronic MEDICAL RECORD NUMBER History obtained from family if available, nursing notes, old chart and ekg, as well as notes from prior ED visits. Patient presented for flank pain and vomiting, we will assess with labs and imaging as indicated at this time.   Procedures ____________________________________________   LABS (pertinent positives/negatives)  Labs Reviewed  LIPASE, BLOOD - Abnormal;  Notable for the following components:      Result Value   Lipase 59 (*)    All other components within normal limits  COMPREHENSIVE METABOLIC PANEL - Abnormal; Notable for the following components:   CO2 19 (*)    Glucose, Bld 142 (*)    All other components within normal limits  CBC - Abnormal; Notable for the following components:   WBC 19.7 (*)    All other components within normal limits  URINALYSIS, COMPLETE (UACMP) WITH MICROSCOPIC - Abnormal; Notable for the following components:   Color, Urine YELLOW (*)    APPearance TURBID (*)    Hgb urine dipstick MODERATE (*)    Ketones, ur 80 (*)    Protein, ur 30 (*)    RBC / HPF >50 (*)    All other components within normal limits  URINE CULTURE  POC URINE PREG, ED  POCT PREGNANCY, URINE    RADIOLOGY Images were viewed by me  CT renal protocol IMPRESSION: 1. 2 mm LEFT ureterovesicular junction calculus resulting in mild obstructive uropathy. 2. Bilateral nephrolithiasis measuring to 3 mm. 3. 4.7 cm mass RIGHT lobe of the liver new from 2015, recommend MRI of the liver with contrast on non emergent basis. 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 This recommendation follows ACR consensus guidelines: Management of Incidental Liver Lesions on CT: A White Paper of the ACR Incidental Findings Committee. J Am Coll Radiol 2017; 16:[id number]; 14:1429-1437. ____________________________________________  DIFFERENTIAL DIAGNOSIS   UTI, pyelonephritis, renal colic  FINAL ASSESSMENT AND PLAN  Renal colic, intractable vomiting, leukocytosis   Plan: The patient had presented for flank pain and vomiting. Patient's labs revealed a markedly elevated white blood cell count but no active infection.  We did send a urine culture and give IV Rocephin.  Patient's imaging did reveal a 2 mm left UVJ stone but we were unable to get her pain or nausea under control.  Patient received numerous doses of pain and nausea medication with only  marginal improvement.  I will discuss with the hospitalist for admission.   Ulice DashJohnathan E Daesha Insco, MD   Note: This note was generated in part or whole with voice recognition software. Voice recognition is usually quite accurate but there are transcription errors that can and very often do occur. I apologize for any typographical errors that were not detected and corrected.     Emily FilbertWilliams, Arjen Deringer E, MD 06/13/18 Emelda [id number]    Emily FilbertWilliams, Jasmyn Picha E, MD 06/13/18 [id number]

## 2018-06-14 LAB — BASIC METABOLIC PANEL
ANION GAP: 5 (ref 5–15)
BUN: 10 mg/dL (ref 6–20)
CO2: 24 mmol/L (ref 22–32)
Calcium: 7.9 mg/dL — ABNORMAL LOW (ref 8.9–10.3)
Chloride: 111 mmol/L (ref 98–111)
Creatinine, Ser: 0.59 mg/dL (ref 0.44–1.00)
GFR calc Af Amer: >60 mL/min (ref >60)
Glucose, Bld: 101 mg/dL — ABNORMAL HIGH (ref 70–99)
POTASSIUM: 3.2 mmol/L — AB (ref 3.5–5.1)
SODIUM: 140 mmol/L (ref 135–145)

## 2018-06-14 LAB — CBC
HEMATOCRIT: 37 % (ref 35.0–47.0)
HEMOGLOBIN: 12.9 g/dL (ref 12.0–16.0)
MCH: 30.3 pg (ref 26.0–34.0)
MCHC: 34.8 g/dL (ref 32.0–36.0)
MCV: 86.9 fL (ref 80.0–100.0)
Platelets: 175 10*3/uL (ref 150–440)
RBC: 4.26 MIL/uL (ref 3.80–5.20)
RDW: 14.6 % — AB (ref 11.5–14.5)
WBC: 14.7 10*3/uL — AB (ref 3.6–11.0)

## 2018-06-14 MED ORDER — POTASSIUM CHLORIDE CRYS ER 20 MEQ PO TBCR
40.0000 meq | EXTENDED_RELEASE_TABLET | Freq: Once | ORAL | Status: AC
  Administered 2018-06-14: 40 meq via ORAL
  Filled 2018-06-14: qty 2

## 2018-06-14 NOTE — Progress Notes (Signed)
Discharge instructions reviewed with the patient and her husband.  IV removed. Patient sent out via wheelchair

## 2018-06-14 NOTE — Progress Notes (Signed)
Pt states she thinks she may have passed a stone, urine strained, small particles found in strainer

## 2018-06-14 NOTE — Discharge Summary (Signed)
[facility] - [place] at [facility]   PATIENT NAME: Amy Gomez    MR#:  [id number]  DATE OF BIRTH:  [birth year]  DATE OF ADMISSION:  06/13/2018 ADMITTING PHYSICIAN: Altamese Dilling, MD  DATE OF DISCHARGE: 06/14/2018  PRIMARY CARE PHYSICIAN: Reubin Milan, MD    ADMISSION DIAGNOSIS:  Renal colic on left side [N23] Intractable vomiting with nausea, unspecified vomiting type [R11.2]  DISCHARGE DIAGNOSIS:  Principal Problem:   Renal stone   SECONDARY DIAGNOSIS:   Past Medical History:  Diagnosis Date  . Hypertension   . Kidney stones   . Migraine     HOSPITAL COURSE:  37 year old female with a history of kidney stone and tobacco dependence who presents with flank pain.  1.  2 mm left ureteral vascular junction kidney stone: Patient has passed her stone. She was treated with IV pain medications and PRN pain medications. She did not have a urinary tract infection. 2.  4.7 cm mass right lobe of the liver new from 2015: MRI of the liver with contrast on nonemergent basis is recommended.  This will need to be ordered by her PCP  3. Tobacco dependence: Patient is encouraged to quit smoking. Counseling was provided for 4 minutes.   4.  Essential hypertension: Patient will resume outpatient medications  DISCHARGE CONDITIONS AND DIET:   Stable for discharge on regular diet  CONSULTS OBTAINED:    DRUG ALLERGIES:   Allergies  Allergen Reactions  . Sulfa Antibiotics Anaphylaxis, Swelling and Rash    DISCHARGE MEDICATIONS:   Allergies as of 06/14/2018      Reactions   Sulfa Antibiotics Anaphylaxis, Swelling, Rash      Medication List    STOP taking these medications   ibuprofen 800 MG tablet Commonly known as:  ADVIL,MOTRIN     TAKE these medications   lisinopril 20 MG tablet Commonly known as:  PRINIVIL,ZESTRIL Take 1 tablet (20 mg total) by mouth daily.   metoprolol succinate 50 MG 24 hr tablet Commonly known as:  TOPROL-XL Take 1  tablet (50 mg total) by mouth daily. Take with or immediately following a meal.   promethazine 25 MG tablet Commonly known as:  PHENERGAN Take 1 tablet (25 mg total) by mouth every 4 (four) hours as needed for nausea or vomiting.   topiramate 50 MG tablet Commonly known as:  TOPAMAX Take 2 tablets (100 mg total) by mouth at bedtime.   traMADol 50 MG tablet Commonly known as:  ULTRAM Take 1 tablet (50 mg total) by mouth every 8 (eight) hours as needed.         Today   CHIEF COMPLAINT:  No acute issues overnight.  Patient has passed stone.   VITAL SIGNS:  Blood pressure 102/60, pulse 73, temperature 98.2 F (36.8 C), temperature source Oral, resp. rate 12, height 5' (1.524 m), weight 168 lb 3.4 oz (76.3 kg), SpO2 98 %.   REVIEW OF SYSTEMS:  Review of Systems  Constitutional: Negative.  Negative for chills, fever and malaise/fatigue.  HENT: Negative.  Negative for ear discharge, ear pain, hearing loss, nosebleeds and sore throat.   Eyes: Negative.  Negative for blurred vision and pain.  Respiratory: Negative.  Negative for cough, hemoptysis, shortness of breath and wheezing.   Cardiovascular: Negative.  Negative for chest pain, palpitations and leg swelling.  Gastrointestinal: Negative.  Negative for abdominal pain, blood in stool, diarrhea, nausea and vomiting.  Genitourinary: Negative.  Negative for dysuria.  Musculoskeletal: Negative.  Negative for back pain.  Skin: Negative.   Neurological: Negative for dizziness, tremors, speech change, focal weakness, seizures and headaches.  Endo/Heme/Allergies: Negative.  Does not bruise/bleed easily.  Psychiatric/Behavioral: Negative.  Negative for depression, hallucinations and suicidal ideas.     PHYSICAL EXAMINATION:  GENERAL:  37 y.o.-year-old patient lying in the bed with no acute distress.  NECK:  Supple, no jugular venous distention. No thyroid enlargement, no tenderness.  LUNGS: Normal breath sounds bilaterally, no  wheezing, rales,rhonchi  No use of accessory muscles of respiration.  CARDIOVASCULAR: S1, S2 normal. No murmurs, rubs, or gallops.  ABDOMEN: Soft, non-tender, non-distended. Bowel sounds present. No organomegaly or mass.  EXTREMITIES: No pedal edema, cyanosis, or clubbing.  PSYCHIATRIC: The patient is alert and oriented x 3.  SKIN: No obvious rash, lesion, or ulcer.   DATA REVIEW:   CBC Recent Labs  Lab 06/14/18 0435  WBC 14.7*  HGB 12.9  HCT 37.0  PLT 175    Chemistries  Recent Labs  Lab 06/13/18 1546 06/14/18 0435  NA 139 140  K 3.6 3.2*  CL 108 111  CO2 19* 24  GLUCOSE 142* 101*  BUN 10 10  CREATININE 0.69 0.59  CALCIUM 9.6 7.9*  AST 23  --   ALT 19  --   ALKPHOS 68  --   BILITOT 0.7  --     Cardiac Enzymes No results for input(s): TROPONINI in the last 168 hours.  Microbiology Results  @MICRORSLT48 @  RADIOLOGY:  Ct Renal Stone Study  Result Date: 06/13/2018 CLINICAL DATA:  LEFT flank pain and vomiting beginning this morning, worsening for 1 hour. History of kidney stones with dissimilar symptoms. EXAM: CT ABDOMEN AND PELVIS WITHOUT CONTRAST TECHNIQUE: Multidetector CT imaging of the abdomen and pelvis was performed following the standard protocol without IV contrast. COMPARISON:  CT abdomen and pelvis November 26, 2017 and MRI abdomen July 06, 2014 FINDINGS: LOWER CHEST: Lung bases are clear. The visualized heart size is normal. No pericardial effusion. 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 However, a second hypodense mass segment 5/6 was not present on prior MRI. Normal gallbladder. PANCREAS: Normal. SPLEEN: Normal. ADRENALS/URINARY TRACT: Kidneys are orthotopic, demonstrating normal size and morphology. Bilateral nephrolithiasis measuring to 3 mm. Mild LEFT hydroureteronephrosis to level of the ureterovesicular junction where a 2 mm calculus is present. Limited  assessment for renal masses by nonenhanced CT. 2.8 cm cyst LEFT kidney. Urinary bladder is partially distended and unremarkable. Normal adrenal glands. STOMACH/BOWEL: The stomach, small and large bowel are normal in course and caliber without inflammatory changes, sensitivity decreased by lack of enteric contrast. Normal appendix. VASCULAR/LYMPHATIC: Aortoiliac vessels are normal in course and caliber. No lymphadenopathy by CT size criteria. REPRODUCTIVE: Normal. OTHER: Trace free fluid in the pelvis is likely physiologic. No intraperitoneal free air. MUSCULOSKELETAL: Non-acute. Minimal grade 1 L5-S1 anterolisthesis, chronic bilateral L5 pars interarticularis defects. IMPRESSION: 1. 2 mm LEFT ureterovesicular junction calculus resulting in mild obstructive uropathy. 2. Bilateral nephrolithiasis measuring to 3 mm. 3. 4.7 cm mass RIGHT lobe of the liver new from 2015, recommend MRI of the liver with contrast on non emergent basis. 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 This recommendation follows ACR consensus guidelines: Management of Incidental Liver Lesions on CT: A White Paper of the ACR Incidental Findings Committee. J Am Coll Radiol 2017; 16:1096-0454; 14:1429-1437. Electronically Signed   By: Awilda Metroourtnay  Bloomer M.D.   On: 06/13/2018 17:37      Allergies as of 06/14/2018  Reactions   Sulfa Antibiotics Anaphylaxis, Swelling, Rash      Medication List    STOP taking these medications   ibuprofen 800 MG tablet Commonly known as:  ADVIL,MOTRIN     TAKE these medications   lisinopril 20 MG tablet Commonly known as:  PRINIVIL,ZESTRIL Take 1 tablet (20 mg total) by mouth daily.   metoprolol succinate 50 MG 24 hr tablet Commonly known as:  TOPROL-XL Take 1 tablet (50 mg total) by mouth daily. Take with or immediately following a meal.   promethazine 25 MG tablet Commonly known as:  PHENERGAN Take 1 tablet (25 mg total) by mouth every 4 (four) hours as needed for nausea or  vomiting.   topiramate 50 MG tablet Commonly known as:  TOPAMAX Take 2 tablets (100 mg total) by mouth at bedtime.   traMADol 50 MG tablet Commonly known as:  ULTRAM Take 1 tablet (50 mg total) by mouth every 8 (eight) hours as needed.           Management plans discussed with the patient and she is in agreement. Stable for discharge home  Patient should follow up with pcp  CODE STATUS:     Code Status Orders  (From admission, onward)        Start     Ordered   06/13/18 2145  Full code  Continuous     06/13/18 2145    Code Status History    This patient has a current code status but no historical code status.      TOTAL TIME TAKING CARE OF THIS PATIENT: 38 minutes.    Note: This dictation was prepared with Dragon dictation along with smaller phrase technology. Any transcriptional errors that result from this process are unintentional.  Aum Caggiano M.D on 06/14/2018 at 10:10 AM  Between 7am to 6pm - Pager - [phone] After 6pm go to [email or web address] - Social research officer, government  [facility]  Office  [phone]  CC: Primary care physician; Reubin Milan, MD

## 2018-06-15 ENCOUNTER — Telehealth: Payer: Self-pay

## 2018-06-15 LAB — URINE CULTURE: Special Requests: NORMAL

## 2018-06-15 LAB — HIV ANTIBODY (ROUTINE TESTING W REFLEX): HIV Screen 4th Generation wRfx: NONREACTIVE

## 2018-06-15 NOTE — Telephone Encounter (Signed)
Transition Care Management Follow-up Telephone Call  Date of discharge and from where: [facility]  How have you been since you were released from the hospital? States she is very weak, almost exhausted feeling. Denies having any flank pain, dysuria or hematuria. N/V has subsided. States she has only taken one dose of Vicodin since d/c, does not feel she needs it. Tolerating fluids and regular diet.  Any questions or concerns? No   Items Reviewed:  Did the pt receive and understand the discharge instructions provided? Yes   Medications obtained and verified? Yes, but states she was not given Phenergan Rx. Advised this Rx was printed and possible RN may have forgotten to provide her with this Rx. Advised she call [facility] ED to make them aware of this oversight.  Any new allergies since your discharge? No   Dietary orders reviewed? Yes  Do you have support at home? Yes   Other (ie: DME, Home Health, etc) N/A  Functional Questionnaire: (I = Independent and D = Dependent) ADL's: I  Bathing/Dressing- I   Meal Prep- I  Eating- I  Maintaining continence- I  Transferring/Ambulation- I  Managing Meds- I   Follow up appointments reviewed:    PCP Hospital f/u appt confirmed? Yes  Scheduled to see Dr. Judithann GravesBerglund on 07/05/18 @ 2:00pm.  Specialist Hospital f/u appt confirmed? N/A   Are transportation arrangements needed? No   If their condition worsens, is the pt aware to call  their PCP or go to the ED? Yes  Was the patient provided with contact information for the PCP's office or ED? Yes  Was the pt encouraged to call back with questions or concerns? Yes

## 2018-07-05 ENCOUNTER — Ambulatory Visit: Payer: Self-pay | Admitting: Internal Medicine

## 2018-08-11 ENCOUNTER — Encounter: Payer: Self-pay | Admitting: Internal Medicine

## 2018-08-11 ENCOUNTER — Other Ambulatory Visit
Admission: RE | Admit: 2018-08-11 | Discharge: 2018-08-11 | Disposition: A | Discharge status: Home / Self Care | Payer: Medicaid Other | Source: Ambulatory Visit | Attending: Internal Medicine | Admitting: Internal Medicine

## 2018-08-11 ENCOUNTER — Ambulatory Visit (INDEPENDENT_AMBULATORY_CARE_PROVIDER_SITE_OTHER): Payer: Medicaid Other | Admitting: Internal Medicine

## 2018-08-11 VITALS — BP 122/78 | HR 94 | Temp 98.8°F | Ht 60.0 in | Wt 162.0 lb

## 2018-08-11 DIAGNOSIS — G43009 Migraine without aura, not intractable, without status migrainosus: Secondary | ICD-10-CM

## 2018-08-11 DIAGNOSIS — I10 Essential (primary) hypertension: Secondary | ICD-10-CM

## 2018-08-11 DIAGNOSIS — Z23 Encounter for immunization: Secondary | ICD-10-CM

## 2018-08-11 DIAGNOSIS — N200 Calculus of kidney: Secondary | ICD-10-CM | POA: Diagnosis not present

## 2018-08-11 LAB — BASIC METABOLIC PANEL
Anion gap: 7 (ref 5–15)
BUN: 16 mg/dL (ref 6–20)
CALCIUM: 9.2 mg/dL (ref 8.9–10.3)
CO2: 23 mmol/L (ref 22–32)
CREATININE: 0.78 mg/dL (ref 0.44–1.00)
Chloride: 107 mmol/L (ref 98–111)
GFR calc non Af Amer: >60 mL/min (ref >60)
GLUCOSE: 104 mg/dL — AB (ref 70–99)
Potassium: 4 mmol/L (ref 3.5–5.1)
Sodium: 137 mmol/L (ref 135–145)

## 2018-08-11 LAB — CBC WITH DIFFERENTIAL/PLATELET
BASOS PCT: 1 %
Basophils Absolute: 0.1 10*3/uL (ref 0–0.1)
EOS ABS: 0.2 10*3/uL (ref 0–0.7)
EOS PCT: 1 %
HEMATOCRIT: 44.1 % (ref 35.0–47.0)
Hemoglobin: 14.9 g/dL (ref 12.0–16.0)
Lymphocytes Relative: 21 %
Lymphs Abs: 3 10*3/uL (ref 1.0–3.6)
MCH: 29.4 pg (ref 26.0–34.0)
MCHC: 33.8 g/dL (ref 32.0–36.0)
MCV: 87.1 fL (ref 80.0–100.0)
MONO ABS: 1 10*3/uL — AB (ref 0.2–0.9)
MONOS PCT: 7 %
NEUTROS ABS: 9.8 10*3/uL — AB (ref 1.4–6.5)
Neutrophils Relative %: 70 %
PLATELETS: 201 10*3/uL (ref 150–440)
RBC: 5.06 MIL/uL (ref 3.80–5.20)
RDW: 14.4 % (ref 11.5–14.5)
WBC: 14 10*3/uL — ABNORMAL HIGH (ref 3.6–11.0)

## 2018-08-11 LAB — TSH: TSH: 0.554 u[IU]/mL (ref 0.350–4.500)

## 2018-08-11 MED ORDER — TRAMADOL HCL 50 MG PO TABS: 50.0000 mg | ORAL_TABLET | Freq: Three times a day (TID) | ORAL | 0 refills | Status: DC | PRN

## 2018-08-11 MED ORDER — TOPIRAMATE 50 MG PO TABS: 100.0000 mg | ORAL_TABLET | Freq: Every day | ORAL | 1 refills | Status: DC

## 2018-08-11 MED ORDER — METOPROLOL SUCCINATE ER 50 MG PO TB24: 50.0000 mg | ORAL_TABLET | Freq: Every day | ORAL | 1 refills | Status: DC

## 2018-08-11 MED ORDER — LISINOPRIL 20 MG PO TABS: 20.0000 mg | ORAL_TABLET | Freq: Every day | ORAL | 1 refills | Status: DC

## 2018-08-11 NOTE — Progress Notes (Signed)
Date:  08/11/2018   Name:  Amy Gomez   DOB:  1981/03/09   MRN:  [id number]   Chief Complaint: Migraine (Refills needed for tramadol. ) and Hypertension (Passed 2 weeks BP has been elevated. )  Migraine   This is a chronic problem. The problem occurs monthly (ususally only has HA with menses). The pain quality is similar to prior headaches. Pertinent negatives include no coughing, dizziness or fever. Her past medical history is significant for hypertension.  Hypertension  This is a chronic problem. The problem is unchanged. The problem is controlled. Pertinent negatives include no chest pain, headaches, palpitations or shortness of breath. Past treatments include ACE inhibitors and beta blockers. The current treatment provides significant improvement.  Renal stones - recently passed a 2 mm stone and used her tramadol for that.  Still only uses tramadol for migraines.  Review of Systems  Constitutional: Negative for chills, fatigue, fever and unexpected weight change.  Respiratory: Negative for cough, chest tightness, shortness of breath and wheezing.   Cardiovascular: Negative for chest pain, palpitations and leg swelling.  Genitourinary: Negative for dysuria and hematuria.  Musculoskeletal: Negative for arthralgias and gait problem.  Neurological: Negative for dizziness and headaches.  Hematological: Negative for adenopathy.  Psychiatric/Behavioral: Negative for dysphoric mood and sleep disturbance.    Patient Active Problem List   Diagnosis Date Noted  . Essential hypertension 07/15/2017  . Gastroesophageal reflux disease without esophagitis 12/18/2016  . RUQ abdominal pain 12/18/2016  . Renal stone 06/04/2015  . Dysmenorrhea 06/04/2015  . Migraine without aura and responsive to treatment 06/04/2015  . 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 Sulfa Antibiotics Anaphylaxis, Swelling and Rash    Past Surgical History:  Procedure Laterality  Date  . DILATION AND CURETTAGE OF UTERUS    . TUBAL LIGATION Bilateral 2007    Social History   Tobacco Use  . Smoking status: Current Every Day Smoker    Packs/day: 0.25    Types: Cigarettes  . Smokeless tobacco: Never Used  Substance Use Topics  . Alcohol use: No    Alcohol/week: 0.0 standard drinks  . Drug use: No     Medication list has been reviewed and updated.  Current Meds  Medication Sig  . lisinopril (PRINIVIL,ZESTRIL) 20 MG tablet Take 1 tablet (20 mg total) by mouth daily.  . metoprolol succinate (TOPROL-XL) 50 MG 24 hr tablet Take 1 tablet (50 mg total) by mouth daily. Take with or immediately following a meal.  . topiramate (TOPAMAX) 50 MG tablet Take 2 tablets (100 mg total) by mouth at bedtime.  . traMADol (ULTRAM) 50 MG tablet Take 1 tablet (50 mg total) by mouth every 8 (eight) hours as needed.    PHQ 2/9 Scores 08/11/2018 07/15/2017  PHQ - 2 Score 1 0    Physical Exam  Constitutional: She is oriented to person, place, and time. She appears well-developed. No distress.  HENT:  Head: Normocephalic and atraumatic.  Eyes: Pupils are equal, round, and reactive to light.  Neck: Normal range of motion. Neck supple.  Cardiovascular: Normal rate, regular rhythm and normal heart sounds.  Pulmonary/Chest: Effort normal and breath sounds normal. No respiratory distress.  Abdominal: Soft. Bowel sounds are normal.  Musculoskeletal: Normal range of motion.  Lymphadenopathy:    She has no cervical adenopathy.  Neurological: She is alert and oriented to person, place, and time.  Skin: Skin is warm and dry. No rash noted.  Psychiatric: She  has a normal mood and affect. Her behavior is normal. Thought content normal.  Nursing note and vitals reviewed.   BP 122/78 (BP Location: Right Arm, Patient Position: Sitting, Cuff Size: Normal)   Pulse 94   Temp 98.8 F (37.1 C) (Oral)   Ht 5' (1.524 m)   Wt 162 lb (73.5 kg)   LMP  (Exact Date)   SpO2 99%   BMI 31.64 kg/m    Assessment and Plan: 1. Migraine without aura and responsive to treatment Continue topamax daily Tramadol PRN - topiramate (TOPAMAX) 50 MG tablet; Take 2 tablets (100 mg total) by mouth at bedtime.  Dispense: 180 tablet; Refill: 1 - traMADol (ULTRAM) 50 MG tablet; Take 1 tablet (50 mg total) by mouth every 8 (eight) hours as needed.  Dispense: 90 tablet; Refill: 0  2. Essential hypertension controlled - lisinopril (PRINIVIL,ZESTRIL) 20 MG tablet; Take 1 tablet (20 mg total) by mouth daily.  Dispense: 90 tablet; Refill: 1 - metoprolol succinate (TOPROL-XL) 50 MG 24 hr tablet; Take 1 tablet (50 mg total) by mouth daily. Take with or immediately following a meal.  Dispense: 90 tablet; Refill: 1 - CBC with Differential/Platelet - Basic metabolic panel - TSH  3. Renal stone Passed but has many more in both kidneys  4. Need for influenza vaccination - Flu Vaccine QUAD 36+ mos IM   Partially dictated using Animal nutritionist. Any errors are unintentional.  Bari Edward, MD [facility]  08/11/2018

## 2018-08-13 DIAGNOSIS — D72829 Elevated white blood cell count, unspecified: Secondary | ICD-10-CM

## 2018-08-16 DIAGNOSIS — D72829 Elevated white blood cell count, unspecified: Secondary | ICD-10-CM

## 2018-08-16 HISTORY — DX: Elevated white blood cell count, unspecified: D72.829

## 2018-08-16 NOTE — Progress Notes (Signed)
[place]  Telephone:([phone] Fax:(336) [phone]  ID: Amy Gomez OB: [birth year]  MR#: [id number]  VOZ#:[id number]  Patient Care Team: Glean Hess, MD as PCP - General (Family Medicine)  CHIEF COMPLAINT: Leukocytosis, unspecified  INTERVAL HISTORY: Patient is a 37 year old female who was noted to have a persistently elevated white blood cell count with neutrophil predominance.  She is anxious, but otherwise feels well.  She complains of profound weakness and fatigue.  She has no neurologic complaints.  She denies any recent fevers or illnesses.  She has no new medications.  She has a good appetite and denies weight loss.  She has no chest pain or shortness of breath.  She denies any nausea, vomiting, constipation, or diarrhea.  She has no urinary complaints.  Patient feels at her baseline offers no further specific complaints today.  REVIEW OF SYSTEMS:   Review of Systems  Constitutional: Positive for malaise/fatigue. Negative for diaphoresis, fever and weight loss.  Respiratory: Negative.  Negative for cough and shortness of breath.   Cardiovascular: Negative.  Negative for chest pain and leg swelling.  Gastrointestinal: Negative.  Negative for abdominal pain.  Genitourinary: Negative.  Negative for dysuria.  Musculoskeletal: Negative.  Negative for back pain.  Skin: Negative.  Negative for rash.  Neurological: Positive for weakness. Negative for dizziness, focal weakness and headaches.  Psychiatric/Behavioral: The patient is nervous/anxious.     As per HPI. Otherwise, a complete review of systems is negative.  PAST MEDICAL HISTORY: Past Medical History:  Diagnosis Date  . Hypertension   . Kidney stones   . Migraine     PAST SURGICAL HISTORY: Past Surgical History:  Procedure Laterality Date  . DILATION AND CURETTAGE OF UTERUS    . TUBAL LIGATION Bilateral 2007    FAMILY HISTORY: Family History  Problem Relation Age of Onset  .  Healthy Mother   . Healthy Father     ADVANCED DIRECTIVES (Y/N):  N  HEALTH MAINTENANCE: Social History   Tobacco Use  . Smoking status: Current Every Day Smoker    Packs/day: 0.25    Types: Cigarettes  . Smokeless tobacco: Never Used  Substance Use Topics  . Alcohol use: No    Alcohol/week: 0.0 standard drinks  . Drug use: No     Colonoscopy:  PAP:  Bone density:  Lipid panel:  Allergies  Allergen Reactions  . Sulfa Antibiotics Anaphylaxis, Swelling and Rash    Current Outpatient Medications  Medication Sig Dispense Refill  . lisinopril (PRINIVIL,ZESTRIL) 20 MG tablet Take 1 tablet (20 mg total) by mouth daily. 90 tablet 1  . metoprolol succinate (TOPROL-XL) 50 MG 24 hr tablet Take 1 tablet (50 mg total) by mouth daily. Take with or immediately following a meal. 90 tablet 1  . topiramate (TOPAMAX) 50 MG tablet Take 2 tablets (100 mg total) by mouth at bedtime. 180 tablet 1  . traMADol (ULTRAM) 50 MG tablet Take 1 tablet (50 mg total) by mouth every 8 (eight) hours as needed. 90 tablet 0   No current facility-administered medications for this visit.     OBJECTIVE: Vitals:   08/20/18 1333  BP: 111/77  Pulse: (!) 105  Resp: 20  Temp: 98 F (36.7 C)     Body mass index is 31.73 kg/m.    ECOG FS:0 - Asymptomatic  General: Well-developed, well-nourished, no acute distress. Eyes: Pink conjunctiva, anicteric sclera. HEENT: Normocephalic, moist mucous membranes, clear oropharnyx. Lungs: Clear to auscultation bilaterally. Heart: Regular rate and rhythm.  No rubs, murmurs, or gallops. Abdomen: Soft, nontender, nondistended. No organomegaly noted, normoactive bowel sounds. Musculoskeletal: No edema, cyanosis, or clubbing. Neuro: Alert, answering all questions appropriately. Cranial nerves grossly intact. Skin: No rashes or petechiae noted. Psych: Normal affect. Lymphatics: No cervical, calvicular, axillary or inguinal LAD.   LAB RESULTS:  Lab Results  Component  Value Date   NA 140 08/20/2018   K 3.5 08/20/2018   CL 108 08/20/2018   CO2 23 08/20/2018   GLUCOSE 100 (H) 08/20/2018   BUN 10 08/20/2018   CREATININE 0.66 08/20/2018   CALCIUM 9.1 08/20/2018   PROT 7.8 08/20/2018   ALBUMIN 3.8 08/20/2018   AST 20 08/20/2018   ALT 17 08/20/2018   ALKPHOS 76 08/20/2018   BILITOT 0.5 08/20/2018   GFRNONAA >60 08/20/2018   GFRAA >60 08/20/2018    Lab Results  Component Value Date   WBC 10.7 (H) 08/20/2018   NEUTROABS 7.6 08/20/2018   HGB 14.2 08/20/2018   HCT 41.2 08/20/2018   MCV 85.5 08/20/2018   PLT 219 08/20/2018     STUDIES: No results found.  ASSESSMENT: Leukocytosis, unspecified  PLAN:    1. Leukocytosis, unspecified: Patient's white blood cell count remains elevated, but has trended down and is now nearly within normal limits.  All of her other laboratory work is either negative or within normal limits.  Peripheral blood flow cytometry as well as BCR-ABL mutation have been ordered and are pending at time of dictation.  No intervention is needed at this time.  Patient does not require bone marrow biopsy.  Return to clinic in 1 month with repeat laboratory work and further evaluation.  If all of her laboratory work is negative, patient could possibly be discharged from clinic. 2.  Weakness and fatigue: Unclear etiology.  Patient's hemoglobin, iron stores, B12, folate, comprehensive metabolic panel, and thyroid panel with TSH are within normal limits.   I spent a total of 45 minutes face-to-face with the patient of which greater than 50% of the visit was spent in counseling and coordination of care as detailed above.  Patient expressed understanding and was in agreement with this plan. She also understands that She can call clinic at any time with any questions, concerns, or complaints.    Lloyd Huger, MD   08/21/2018 8:49 AM

## 2018-08-20 ENCOUNTER — Encounter: Payer: Self-pay | Admitting: Oncology

## 2018-08-20 ENCOUNTER — Inpatient Hospital Stay: Payer: Medicaid Other

## 2018-08-20 ENCOUNTER — Other Ambulatory Visit: Payer: Self-pay

## 2018-08-20 ENCOUNTER — Inpatient Hospital Stay: Payer: Medicaid Other | Attending: Oncology | Admitting: Oncology

## 2018-08-20 DIAGNOSIS — F17210 Nicotine dependence, cigarettes, uncomplicated: Secondary | ICD-10-CM | POA: Diagnosis not present

## 2018-08-20 DIAGNOSIS — R5383 Other fatigue: Secondary | ICD-10-CM | POA: Diagnosis not present

## 2018-08-20 DIAGNOSIS — D72829 Elevated white blood cell count, unspecified: Secondary | ICD-10-CM

## 2018-08-20 DIAGNOSIS — R531 Weakness: Secondary | ICD-10-CM

## 2018-08-20 LAB — COMPREHENSIVE METABOLIC PANEL
ALK PHOS: 76 U/L (ref 38–126)
ALT: 17 U/L (ref 0–44)
AST: 20 U/L (ref 15–41)
Albumin: 3.8 g/dL (ref 3.5–5.0)
Anion gap: 9 (ref 5–15)
BUN: 10 mg/dL (ref 6–20)
CALCIUM: 9.1 mg/dL (ref 8.9–10.3)
CO2: 23 mmol/L (ref 22–32)
CREATININE: 0.66 mg/dL (ref 0.44–1.00)
Chloride: 108 mmol/L (ref 98–111)
Glucose, Bld: 100 mg/dL — ABNORMAL HIGH (ref 70–99)
Potassium: 3.5 mmol/L (ref 3.5–5.1)
Sodium: 140 mmol/L (ref 135–145)
Total Bilirubin: 0.5 mg/dL (ref 0.3–1.2)
Total Protein: 7.8 g/dL (ref 6.5–8.1)

## 2018-08-20 LAB — IRON AND TIBC
IRON: 87 ug/dL (ref 28–170)
Saturation Ratios: 27 % (ref 10.4–31.8)
TIBC: 326 ug/dL (ref 250–450)
UIBC: 239 ug/dL

## 2018-08-20 LAB — CBC WITH DIFFERENTIAL/PLATELET
Abs Immature Granulocytes: 0.03 10*3/uL (ref 0.00–0.07)
BASOS ABS: 0.1 10*3/uL (ref 0.0–0.1)
Basophils Relative: 1 %
Eosinophils Absolute: 0.1 10*3/uL (ref 0.0–0.5)
Eosinophils Relative: 1 %
HCT: 41.2 % (ref 36.0–46.0)
Hemoglobin: 14.2 g/dL (ref 12.0–15.0)
IMMATURE GRANULOCYTES: 0 %
LYMPHS ABS: 2.3 10*3/uL (ref 0.7–4.0)
LYMPHS PCT: 21 %
MCH: 29.5 pg (ref 26.0–34.0)
MCHC: 34.5 g/dL (ref 30.0–36.0)
MCV: 85.5 fL (ref 80.0–100.0)
Monocytes Absolute: 0.7 10*3/uL (ref 0.1–1.0)
Monocytes Relative: 6 %
NEUTROS PCT: 71 %
Neutro Abs: 7.6 10*3/uL (ref 1.7–7.7)
Platelets: 219 10*3/uL (ref 150–400)
RBC: 4.82 MIL/uL (ref 3.87–5.11)
RDW: 13.7 % (ref 11.5–15.5)
WBC: 10.7 10*3/uL — ABNORMAL HIGH (ref 4.0–10.5)
nRBC: 0 % (ref 0.0–0.2)

## 2018-08-20 LAB — FERRITIN: FERRITIN: 32 ng/mL (ref 11–307)

## 2018-08-20 LAB — LACTATE DEHYDROGENASE: LDH: 142 U/L (ref 98–192)

## 2018-08-20 LAB — FOLATE: Folate: 11.3 ng/mL (ref >5.9)

## 2018-08-20 NOTE — Progress Notes (Signed)
Patient here today for initial evaluation regarding leukocytosis.  

## 2018-08-21 LAB — THYROID PANEL WITH TSH
FREE THYROXINE INDEX: 2.1 (ref 1.2–4.9)
T3 UPTAKE RATIO: 26 % (ref 24–39)
T4, Total: 8.1 ug/dL (ref 4.5–12.0)
TSH: 0.529 u[IU]/mL (ref 0.450–4.500)

## 2018-08-21 LAB — VITAMIN B12: Vitamin B-12: 279 pg/mL (ref 180–914)

## 2018-08-26 LAB — COMP PANEL: LEUKEMIA/LYMPHOMA

## 2018-08-30 LAB — BCR-ABL1, CML/ALL, PCR, QUANT

## 2018-09-09 ENCOUNTER — Other Ambulatory Visit: Payer: Self-pay | Admitting: *Deleted

## 2018-09-09 DIAGNOSIS — D72829 Elevated white blood cell count, unspecified: Secondary | ICD-10-CM

## 2018-09-12 NOTE — Progress Notes (Deleted)
[place]  [phone]) [phone] Fax:(336) [phone]  ID: Amy Gomez OB: 1981/03/07  MR#: [id number]  QBH#:[id number]  Patient Care Team: Glean Hess, MD as PCP - General (Family Medicine)  CHIEF COMPLAINT: Leukocytosis, unspecified  INTERVAL HISTORY: Patient is a 37 year old female who was noted to have a persistently elevated white blood cell count with neutrophil predominance.  She is anxious, but otherwise feels well.  She complains of profound weakness and fatigue.  She has no neurologic complaints.  She denies any recent fevers or illnesses.  She has no new medications.  She has a good appetite and denies weight loss.  She has no chest pain or shortness of breath.  She denies any nausea, vomiting, constipation, or diarrhea.  She has no urinary complaints.  Patient feels at her baseline offers no further specific complaints today.  REVIEW OF SYSTEMS:   Review of Systems  Constitutional: Positive for malaise/fatigue. Negative for diaphoresis, fever and weight loss.  Respiratory: Negative.  Negative for cough and shortness of breath.   Cardiovascular: Negative.  Negative for chest pain and leg swelling.  Gastrointestinal: Negative.  Negative for abdominal pain.  Genitourinary: Negative.  Negative for dysuria.  Musculoskeletal: Negative.  Negative for back pain.  Skin: Negative.  Negative for rash.  Neurological: Positive for weakness. Negative for dizziness, focal weakness and headaches.  Psychiatric/Behavioral: The patient is nervous/anxious.     As per HPI. Otherwise, a complete review of systems is negative.  PAST MEDICAL HISTORY: Past Medical History:  Diagnosis Date  . Hypertension   . Kidney stones   . Migraine     PAST SURGICAL HISTORY: Past Surgical History:  Procedure Laterality Date  . DILATION AND CURETTAGE OF UTERUS    . TUBAL LIGATION Bilateral 2007    FAMILY HISTORY: Family History  Problem Relation Age of Onset  .  Healthy Mother   . Healthy Father     ADVANCED DIRECTIVES (Y/N):  N  HEALTH MAINTENANCE: Social History   Tobacco Use  . Smoking status: Current Every Day Smoker    Packs/day: 0.25    Types: Cigarettes  . Smokeless tobacco: Never Used  Substance Use Topics  . Alcohol use: No    Alcohol/week: 0.0 standard drinks  . Drug use: No     Colonoscopy:  PAP:  Bone density:  Lipid panel:  Allergies  Allergen Reactions  . Sulfa Antibiotics Anaphylaxis, Swelling and Rash    Current Outpatient Medications  Medication Sig Dispense Refill  . lisinopril (PRINIVIL,ZESTRIL) 20 MG tablet Take 1 tablet (20 mg total) by mouth daily. 90 tablet 1  . metoprolol succinate (TOPROL-XL) 50 MG 24 hr tablet Take 1 tablet (50 mg total) by mouth daily. Take with or immediately following a meal. 90 tablet 1  . topiramate (TOPAMAX) 50 MG tablet Take 2 tablets (100 mg total) by mouth at bedtime. 180 tablet 1  . traMADol (ULTRAM) 50 MG tablet Take 1 tablet (50 mg total) by mouth every 8 (eight) hours as needed. 90 tablet 0   No current facility-administered medications for this visit.     OBJECTIVE: There were no vitals filed for this visit.   There is no height or weight on file to calculate BMI.    ECOG FS:0 - Asymptomatic  General: Well-developed, well-nourished, no acute distress. Eyes: Pink conjunctiva, anicteric sclera. HEENT: Normocephalic, moist mucous membranes, clear oropharnyx. Lungs: Clear to auscultation bilaterally. Heart: Regular rate and rhythm. No rubs, murmurs, or gallops. Abdomen: Soft, nontender, nondistended. No  organomegaly noted, normoactive bowel sounds. Musculoskeletal: No edema, cyanosis, or clubbing. Neuro: Alert, answering all questions appropriately. Cranial nerves grossly intact. Skin: No rashes or petechiae noted. Psych: Normal affect. Lymphatics: No cervical, calvicular, axillary or inguinal LAD.   LAB RESULTS:  Lab Results  Component Value Date   NA 140  08/20/2018   K 3.5 08/20/2018   CL 108 08/20/2018   CO2 23 08/20/2018   GLUCOSE 100 (H) 08/20/2018   BUN 10 08/20/2018   CREATININE 0.66 08/20/2018   CALCIUM 9.1 08/20/2018   PROT 7.8 08/20/2018   ALBUMIN 3.8 08/20/2018   AST 20 08/20/2018   ALT 17 08/20/2018   ALKPHOS 76 08/20/2018   BILITOT 0.5 08/20/2018   GFRNONAA >60 08/20/2018   GFRAA >60 08/20/2018    Lab Results  Component Value Date   WBC 10.7 (H) 08/20/2018   NEUTROABS 7.6 08/20/2018   HGB 14.2 08/20/2018   HCT 41.2 08/20/2018   MCV 85.5 08/20/2018   PLT 219 08/20/2018     STUDIES: No results found.  ASSESSMENT: Leukocytosis, unspecified  PLAN:    1. Leukocytosis, unspecified: Patient's white blood cell count remains elevated, but has trended down and is now nearly within normal limits.  All of her other laboratory work is either negative or within normal limits.  Peripheral blood flow cytometry as well as BCR-ABL mutation have been ordered and are pending at time of dictation.  No intervention is needed at this time.  Patient does not require bone marrow biopsy.  Return to clinic in 1 month with repeat laboratory work and further evaluation.  If all of her laboratory work is negative, patient could possibly be discharged from clinic. 2.  Weakness and fatigue: Unclear etiology.  Patient's hemoglobin, iron stores, B12, folate, comprehensive metabolic panel, and thyroid panel with TSH are within normal limits.   I spent a total of 45 minutes face-to-face with the patient of which greater than 50% of the visit was spent in counseling and coordination of care as detailed above.  Patient expressed understanding and was in agreement with this plan. She also understands that She can call clinic at any time with any questions, concerns, or complaints.    Lloyd Huger, MD   09/12/2018 10:59 PM

## 2018-09-17 ENCOUNTER — Inpatient Hospital Stay: Payer: Medicaid Other

## 2018-09-17 ENCOUNTER — Inpatient Hospital Stay: Payer: Medicaid Other | Admitting: Oncology

## 2018-09-19 NOTE — Progress Notes (Signed)
[facility]  Telephone:([phone]) [phone] [phone]  ID: Amy Gomez OB: 1981-05-14  MR#: [id number]  LKG#:[id number]  Patient Care Team: Reubin Milan, MD as PCP - General (Family Medicine)  CHIEF COMPLAINT: Leukocytosis, unspecified  INTERVAL HISTORY: Patient returns to clinic today for repeat laboratory work and further evaluation.  She continues to complain of persistent weakness and fatigue, but otherwise feels well.  She has no neurologic complaints.  She denies any recent fevers or illnesses.  She has a good appetite and denies weight loss.  She has no chest pain or shortness of breath.  She denies any nausea, vomiting, constipation, or diarrhea.  She has no urinary complaints.  Patient offers no further specific complaints today.  REVIEW OF SYSTEMS:   Review of Systems  Constitutional: Positive for malaise/fatigue. Negative for diaphoresis, fever and weight loss.  Respiratory: Negative.  Negative for cough and shortness of breath.   Cardiovascular: Negative.  Negative for chest pain and leg swelling.  Gastrointestinal: Negative.  Negative for abdominal pain.  Genitourinary: Negative.  Negative for dysuria.  Musculoskeletal: Negative.  Negative for back pain.  Skin: Negative.  Negative for rash.  Neurological: Positive for weakness. Negative for dizziness, focal weakness and headaches.  Psychiatric/Behavioral: Negative.  The patient is not nervous/anxious.     As per HPI. Otherwise, a complete review of systems is negative.  PAST MEDICAL HISTORY: Past Medical History:  Diagnosis Date  . Hypertension   . Kidney stones   . Migraine     PAST SURGICAL HISTORY: Past Surgical History:  Procedure Laterality Date  . DILATION AND CURETTAGE OF UTERUS    . TUBAL LIGATION Bilateral 2007    FAMILY HISTORY: Family History  Problem Relation Age of Onset  . Healthy Mother   . Healthy Father     ADVANCED DIRECTIVES (Y/N):  N  HEALTH  MAINTENANCE: Social History   Tobacco Use  . Smoking status: Current Every Day Smoker    Packs/day: 0.25    Types: Cigarettes  . Smokeless tobacco: Never Used  Substance Use Topics  . Alcohol use: No    Alcohol/week: 0.0 standard drinks  . Drug use: No     Colonoscopy:  PAP:  Bone density:  Lipid panel:  Allergies  Allergen Reactions  . Sulfa Antibiotics Anaphylaxis, Swelling and Rash    Current Outpatient Medications  Medication Sig Dispense Refill  . lisinopril (PRINIVIL,ZESTRIL) 20 MG tablet Take 1 tablet (20 mg total) by mouth daily. 90 tablet 1  . metoprolol succinate (TOPROL-XL) 50 MG 24 hr tablet Take 1 tablet (50 mg total) by mouth daily. Take with or immediately following a meal. 90 tablet 1  . topiramate (TOPAMAX) 50 MG tablet Take 2 tablets (100 mg total) by mouth at bedtime. 180 tablet 1  . traMADol (ULTRAM) 50 MG tablet Take 1 tablet (50 mg total) by mouth every 8 (eight) hours as needed. 90 tablet 0   No current facility-administered medications for this visit.     OBJECTIVE: Vitals:   09/24/18 1054  BP: 113/82  Pulse: 87  Resp: 20  Temp: (!) 97 F (36.1 C)     Body mass index is 31.69 kg/m.    ECOG FS:0 - Asymptomatic  General: Well-developed, well-nourished, no acute distress. Eyes: Pink conjunctiva, anicteric sclera. HEENT: Normocephalic, moist mucous membranes. Lungs: Clear to auscultation bilaterally. Heart: Regular rate and rhythm. No rubs, murmurs, or gallops. Abdomen: Soft, nontender, nondistended. No organomegaly noted, normoactive bowel sounds. Musculoskeletal: No edema, cyanosis,  or clubbing. Neuro: Alert, answering all questions appropriately. Cranial nerves grossly intact. Skin: No rashes or petechiae noted. Psych: Normal affect.  LAB RESULTS:  Lab Results  Component Value Date   NA 140 08/20/2018   K 3.5 08/20/2018   CL 108 08/20/2018   CO2 23 08/20/2018   GLUCOSE 100 (H) 08/20/2018   BUN 10 08/20/2018   CREATININE 0.66  08/20/2018   CALCIUM 9.1 08/20/2018   PROT 7.8 08/20/2018   ALBUMIN 3.8 08/20/2018   AST 20 08/20/2018   ALT 17 08/20/2018   ALKPHOS 76 08/20/2018   BILITOT 0.5 08/20/2018   GFRNONAA >60 08/20/2018   GFRAA >60 08/20/2018    Lab Results  Component Value Date   WBC 9.7 09/24/2018   NEUTROABS 6.0 09/24/2018   HGB 13.8 09/24/2018   HCT 40.6 09/24/2018   MCV 86.9 09/24/2018   PLT 261 09/24/2018     STUDIES: No results found.  ASSESSMENT: Leukocytosis, unspecified  PLAN:    1. Leukocytosis, unspecified: Patient's white blood cell count is now within normal limits.  Previously, all of her other laboratory work was also either negative or within normal limits including peripheral blood flow cytometry and BCR-ABL mutation.  No further intervention is needed.  No further follow-up is necessary.  Please refer patient back if there are any questions or concerns.  2.  Weakness and fatigue: Unclear etiology, although patient feels it may be related to her blood pressure medication.  Her CBC, iron stores, B12, folate, comprehensive metabolic panel, and thyroid panel with TSH are all within normal limits.   I spent a total of 20 minutes face-to-face with the patient of which greater than 50% of the visit was spent in counseling and coordination of care as detailed above.  Patient expressed understanding and was in agreement with this plan. She also understands that She can call clinic at any time with any questions, concerns, or complaints.    Jeralyn Ruths, MD   09/24/2018 3:30 PM

## 2018-09-24 ENCOUNTER — Inpatient Hospital Stay (HOSPITAL_BASED_OUTPATIENT_CLINIC_OR_DEPARTMENT_OTHER): Payer: Medicaid Other | Admitting: Oncology

## 2018-09-24 ENCOUNTER — Inpatient Hospital Stay: Payer: Medicaid Other | Attending: Oncology

## 2018-09-24 VITALS — BP 113/82 | HR 87 | Temp 97.0°F | Resp 20 | Wt 162.3 lb

## 2018-09-24 DIAGNOSIS — Z79899 Other long term (current) drug therapy: Secondary | ICD-10-CM

## 2018-09-24 DIAGNOSIS — D72829 Elevated white blood cell count, unspecified: Secondary | ICD-10-CM | POA: Insufficient documentation

## 2018-09-24 DIAGNOSIS — F17210 Nicotine dependence, cigarettes, uncomplicated: Secondary | ICD-10-CM | POA: Diagnosis not present

## 2018-09-24 LAB — CBC WITH DIFFERENTIAL/PLATELET
ABS IMMATURE GRANULOCYTES: 0.05 10*3/uL (ref 0.00–0.07)
BASOS PCT: 1 %
Basophils Absolute: 0.1 10*3/uL (ref 0.0–0.1)
Eosinophils Absolute: 0.1 10*3/uL (ref 0.0–0.5)
Eosinophils Relative: 1 %
HCT: 40.6 % (ref 36.0–46.0)
Hemoglobin: 13.8 g/dL (ref 12.0–15.0)
IMMATURE GRANULOCYTES: 1 %
Lymphocytes Relative: 29 %
Lymphs Abs: 2.8 10*3/uL (ref 0.7–4.0)
MCH: 29.6 pg (ref 26.0–34.0)
MCHC: 34 g/dL (ref 30.0–36.0)
MCV: 86.9 fL (ref 80.0–100.0)
Monocytes Absolute: 0.7 10*3/uL (ref 0.1–1.0)
Monocytes Relative: 7 %
NEUTROS ABS: 6 10*3/uL (ref 1.7–7.7)
NEUTROS PCT: 61 %
PLATELETS: 261 10*3/uL (ref 150–400)
RBC: 4.67 MIL/uL (ref 3.87–5.11)
RDW: 14.1 % (ref 11.5–15.5)
WBC: 9.7 10*3/uL (ref 4.0–10.5)
nRBC: 0 % (ref 0.0–0.2)

## 2018-09-24 NOTE — Progress Notes (Signed)
Patient denies any concerns today.  

## 2018-10-29 ENCOUNTER — Ambulatory Visit
Admission: EM | Admit: 2018-10-29 | Discharge: 2018-10-29 | Disposition: A | Discharge status: Home / Self Care | Payer: Medicaid Other | Attending: Family Medicine | Admitting: Family Medicine

## 2018-10-29 ENCOUNTER — Other Ambulatory Visit: Payer: Self-pay

## 2018-10-29 ENCOUNTER — Ambulatory Visit: Payer: Medicaid Other

## 2018-10-29 DIAGNOSIS — M79675 Pain in left toe(s): Secondary | ICD-10-CM | POA: Insufficient documentation

## 2018-10-29 DIAGNOSIS — M12572 Traumatic arthropathy, left ankle and foot: Secondary | ICD-10-CM | POA: Insufficient documentation

## 2018-10-29 DIAGNOSIS — I10 Essential (primary) hypertension: Secondary | ICD-10-CM | POA: Diagnosis not present

## 2018-10-29 DIAGNOSIS — M7989 Other specified soft tissue disorders: Secondary | ICD-10-CM | POA: Diagnosis not present

## 2018-10-29 DIAGNOSIS — K219 Gastro-esophageal reflux disease without esophagitis: Secondary | ICD-10-CM | POA: Diagnosis not present

## 2018-10-29 DIAGNOSIS — F17210 Nicotine dependence, cigarettes, uncomplicated: Secondary | ICD-10-CM | POA: Diagnosis not present

## 2018-10-29 DIAGNOSIS — Z79899 Other long term (current) drug therapy: Secondary | ICD-10-CM | POA: Diagnosis not present

## 2018-10-29 DIAGNOSIS — Z791 Long term (current) use of non-steroidal anti-inflammatories (NSAID): Secondary | ICD-10-CM | POA: Diagnosis not present

## 2018-10-29 DIAGNOSIS — Z882 Allergy status to sulfonamides status: Secondary | ICD-10-CM | POA: Insufficient documentation

## 2018-10-29 DIAGNOSIS — M79672 Pain in left foot: Secondary | ICD-10-CM | POA: Diagnosis present

## 2018-10-29 MED ORDER — MELOXICAM 15 MG PO TABS: 15.0000 mg | ORAL_TABLET | Freq: Every day | ORAL | 0 refills | Status: DC

## 2018-10-29 NOTE — ED Provider Notes (Signed)
[facility]    CSN: [id number] Arrival date & time: 10/29/18  6644     History   Chief Complaint Chief Complaint  Patient presents with  . Foot Pain    HPI Amy Gomez is a 37 y.o. female.   HPI  68 female states that she had sudden pain and swelling in her left big toe on last night.  She states the pain has been traveling up into her arch.  No known injury.  No history of gout.  Had renal stones in the past.  She indicates that the MTP of the great toe is what bothers her the most.  Is been using Profen which helps with the swelling but not for the pain.  She even took tramadol last night which did not seem to help with the pain.        Past Medical History:  Diagnosis Date  . Hypertension   . Kidney stones   . Migraine     Patient Active Problem List   Diagnosis Date Noted  . Leukocytosis 08/16/2018  . Essential hypertension 07/15/2017  . Gastroesophageal reflux disease without esophagitis 12/18/2016  . Renal stone 06/04/2015  . Dysmenorrhea 06/04/2015  . Migraine without aura and responsive to treatment 06/04/2015  . 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 DILATION AND CURETTAGE OF UTERUS    . TUBAL LIGATION Bilateral 2007    OB History   No obstetric history on file.      Home Medications    Prior to Admission medications   Medication Sig Start Date End Date Taking? Authorizing Provider  lisinopril (PRINIVIL,ZESTRIL) 20 MG tablet Take 1 tablet (20 mg total) by mouth daily. 08/11/18   Reubin Milan, MD  meloxicam (MOBIC) 15 MG tablet Take 1 tablet (15 mg total) by mouth daily. 10/29/18   Lutricia Feil, PA-C  metoprolol succinate (TOPROL-XL) 50 MG 24 hr tablet Take 1 tablet (50 mg total) by mouth daily. Take with or immediately following a meal. 08/11/18   Reubin Milan, MD  topiramate (TOPAMAX) 50 MG tablet Take 2 tablets (100 mg total) by mouth at bedtime. 08/11/18    Reubin Milan, MD  traMADol (ULTRAM) 50 MG tablet Take 1 tablet (50 mg total) by mouth every 8 (eight) hours as needed. 08/11/18   Reubin Milan, MD    Family History Family History  Problem Relation Age of Onset  . Healthy Mother   . Healthy Father     Social History Social History   Tobacco Use  . Smoking status: Current Every Day Smoker    Packs/day: 0.25    Types: Cigarettes  . Smokeless tobacco: Never Used  Substance Use Topics  . Alcohol use: No    Alcohol/week: 0.0 standard drinks  . Drug use: No     Allergies   Sulfa antibiotics   Review of Systems Review of Systems  Constitutional: Positive for activity change. Negative for appetite change, chills, fatigue and fever.  Musculoskeletal: Positive for arthralgias and gait problem.  All other systems reviewed and are negative.    Physical Exam Triage Vital Signs ED Triage Vitals  Enc Vitals Group     BP 10/29/18 0856 (!) 149/105     Pulse Rate 10/29/18 0856 86     Resp 10/29/18 0856 18     Temp 10/29/18 0856 97.7 F (36.5 C)     Temp src --  SpO2 10/29/18 0856 98 %     Weight 10/29/18 0857 165 lb (74.8 kg)     Height 10/29/18 0857 5\' 1"  (1.549 m)     Head Circumference --      Peak Flow --      Pain Score 10/29/18 0857 5     Pain Loc --      Pain Edu? --      Excl. in GC? --    No data found.  Updated Vital Signs BP (!) 149/105 (BP Location: Right Arm) Comment: has not had her b/p meds this week  Pulse 86   Temp 97.7 F (36.5 C)   Resp 18   Ht 5\' 1"  (1.549 m)   Wt 165 lb (74.8 kg)   LMP 10/18/2018   SpO2 98%   BMI 31.18 kg/m   Visual Acuity Right Eye Distance:   Left Eye Distance:   Bilateral Distance:    Right Eye Near:   Left Eye Near:    Bilateral Near:     Physical Exam Vitals signs and nursing note reviewed.  Constitutional:      General: She is not in acute distress.    Appearance: Normal appearance. She is not ill-appearing or diaphoretic.  HENT:     Head:  Normocephalic.     Nose: Nose normal.     Mouth/Throat:     Mouth: Mucous membranes are moist.  Eyes:     Pupils: Pupils are equal, round, and reactive to light.  Neck:     Musculoskeletal: Normal range of motion.  Musculoskeletal: Normal range of motion.  Neurological:     Mental Status: She is alert.      UC Treatments / Results  Labs (all labs ordered are listed, but only abnormal results are displayed) Labs Reviewed - No data to display  EKG None  Radiology Dg Foot Complete Left  Result Date: 10/29/2018 CLINICAL DATA:  Pain of the MTP joint of the great toe and of the arch over the last week. Injury 3 weeks ago. EXAM: LEFT FOOT - COMPLETE 3+ VIEW COMPARISON:  None. FINDINGS: No osseous or articular abnormality seen. There does appear to be mild soft tissue swelling in the region of the MTP joint of the great toe, nonspecific. IMPRESSION: No osseous or articular finding. Mild soft tissue swelling in the region of the MTP joint of the great toe, nonspecific. Electronically Signed   By: Paulina FusiMark  Shogry M.D.   On: 10/29/2018 09:39    Procedures Procedures (including critical care time)  Medications Ordered in UC Medications - No data to display  Initial Impression / Assessment and Plan / UC Course  I have reviewed the triage vital signs and the nursing notes.  Pertinent labs & imaging results that were available during my care of the patient were reviewed by me and considered in my medical decision making (see chart for details).   Patient presents with pain in her left great first MTP but has a fairly negative exam. X Rays have shown soft tissue swelling in the area.  She does not have warmth allows me to move her joint passively without any complaints.  She does have complaints of first MTP joint pain with compression of the joint.  There is no ecchymosis no warmth no erythema and very minimal swelling.  Her foot is relatively benign with occasional discomfort in the arch and  over the calcaneus.  Has a history of a dog stepped on her foot some 3  weeks ago but this is unlikely the source of her discomfort.  Reassured her that this is unlikely gout not exactly certain what is causing her pain.  However if she continues to have pain I recommend she follow-up with podiatry.  Given her a postop shoe to wear for more support and comfort.  She is to ice and elevate her foot sufficiently to control swelling and pain.  Basically she should avoid symptoms as much as possible.   Final Clinical Impressions(s) / UC Diagnoses   Final diagnoses:  Traumatic arthritis of left foot     Discharge Instructions     Apply ice 20 minutes out of every 2 hours 4-5 times daily for comfort.  Your foot above the level of your heart sufficiently to control swelling or discomfort.  If you are not improving follow-up with podiatry.    ED Prescriptions    Medication Sig Dispense Auth. Provider   meloxicam (MOBIC) 15 MG tablet Take 1 tablet (15 mg total) by mouth daily. 30 tablet Lutricia Feiloemer, Nidia Grogan P, PA-C     Controlled Substance Prescriptions [place] Controlled Substance Registry consulted? Not Applicable   Lutricia FeilRoemer, Deicy Rusk P, PA-C 10/29/18 1002

## 2018-10-29 NOTE — ED Triage Notes (Signed)
Pt states she had sudden pain and swelling in left big toe on Thursday night. Worse at night. Then traveled into her arch. Denies injury

## 2018-10-29 NOTE — Discharge Instructions (Addendum)
Apply ice 20 minutes out of every 2 hours 4-5 times daily for comfort.  Your foot above the level of your heart sufficiently to control swelling or discomfort.  If you are not improving follow-up with podiatry.

## 2018-11-17 ENCOUNTER — Telehealth: Payer: Self-pay

## 2018-11-17 ENCOUNTER — Other Ambulatory Visit: Payer: Self-pay | Admitting: Internal Medicine

## 2018-11-17 ENCOUNTER — Other Ambulatory Visit: Payer: Self-pay

## 2018-11-17 DIAGNOSIS — I10 Essential (primary) hypertension: Secondary | ICD-10-CM

## 2018-11-17 DIAGNOSIS — G43009 Migraine without aura, not intractable, without status migrainosus: Secondary | ICD-10-CM

## 2018-11-17 MED ORDER — LISINOPRIL 20 MG PO TABS: 20.0000 mg | ORAL_TABLET | Freq: Every day | ORAL | 0 refills | Status: DC

## 2018-11-17 MED ORDER — METOPROLOL SUCCINATE ER 50 MG PO TB24: 50.0000 mg | ORAL_TABLET | Freq: Every day | ORAL | 0 refills | Status: DC

## 2018-11-17 MED ORDER — TRAMADOL HCL 50 MG PO TABS: 50.0000 mg | ORAL_TABLET | Freq: Three times a day (TID) | ORAL | 0 refills | Status: DC | PRN

## 2018-11-17 NOTE — Telephone Encounter (Signed)
Patient said husband just found a job but will not have insurance for 6 months. Now that husband has a job her Medicaid is being canceled at the end of this month. Calling to ask for medications to be sent in while she still has medicaid to get her until the end of June. Asked her if 90 tablets of tramadol will get her until June and she said "oh yes, I only use as needed."   Sent in all medication to [facility] in [place] and patient verbalized understanding.

## 2019-01-25 ENCOUNTER — Ambulatory Visit: Payer: Self-pay | Admitting: Internal Medicine

## 2019-01-25 ENCOUNTER — Telehealth: Payer: Self-pay | Admitting: Internal Medicine

## 2019-01-25 ENCOUNTER — Other Ambulatory Visit: Payer: Self-pay

## 2019-01-25 ENCOUNTER — Encounter: Payer: Self-pay | Admitting: Internal Medicine

## 2019-01-25 ENCOUNTER — Emergency Department
Admission: EM | Admit: 2019-01-25 | Discharge: 2019-01-25 | Disposition: A | Discharge status: Home / Self Care | Payer: Medicaid Other | Attending: Emergency Medicine | Admitting: Emergency Medicine

## 2019-01-25 ENCOUNTER — Emergency Department: Payer: Medicaid Other

## 2019-01-25 DIAGNOSIS — N200 Calculus of kidney: Secondary | ICD-10-CM | POA: Diagnosis not present

## 2019-01-25 DIAGNOSIS — R319 Hematuria, unspecified: Secondary | ICD-10-CM

## 2019-01-25 DIAGNOSIS — N390 Urinary tract infection, site not specified: Secondary | ICD-10-CM | POA: Insufficient documentation

## 2019-01-25 DIAGNOSIS — R160 Hepatomegaly, not elsewhere classified: Secondary | ICD-10-CM

## 2019-01-25 DIAGNOSIS — F17210 Nicotine dependence, cigarettes, uncomplicated: Secondary | ICD-10-CM | POA: Diagnosis not present

## 2019-01-25 DIAGNOSIS — R1030 Lower abdominal pain, unspecified: Secondary | ICD-10-CM | POA: Diagnosis present

## 2019-01-25 DIAGNOSIS — Z79899 Other long term (current) drug therapy: Secondary | ICD-10-CM | POA: Insufficient documentation

## 2019-01-25 DIAGNOSIS — I10 Essential (primary) hypertension: Secondary | ICD-10-CM | POA: Diagnosis not present

## 2019-01-25 LAB — URINALYSIS, COMPLETE (UACMP) WITH MICROSCOPIC
Bilirubin Urine: NEGATIVE
Glucose, UA: 50 mg/dL — AB
Ketones, ur: 5 mg/dL — AB
Nitrite: NEGATIVE
Protein, ur: 30 mg/dL — AB
RBC / HPF: >50 RBC/hpf — ABNORMAL HIGH (ref 0–5)
Specific Gravity, Urine: 1.016 (ref 1.005–1.030)
pH: 6 (ref 5.0–8.0)

## 2019-01-25 LAB — BASIC METABOLIC PANEL
Anion gap: 11 (ref 5–15)
BUN: 7 mg/dL (ref 6–20)
CALCIUM: 9.1 mg/dL (ref 8.9–10.3)
CO2: 19 mmol/L — ABNORMAL LOW (ref 22–32)
Chloride: 107 mmol/L (ref 98–111)
Creatinine, Ser: 0.76 mg/dL (ref 0.44–1.00)
GFR calc Af Amer: >60 mL/min (ref >60)
GFR calc non Af Amer: >60 mL/min (ref >60)
GLUCOSE: 165 mg/dL — AB (ref 70–99)
Potassium: 3.8 mmol/L (ref 3.5–5.1)
Sodium: 137 mmol/L (ref 135–145)

## 2019-01-25 LAB — CBC
HCT: 41 % (ref 36.0–46.0)
Hemoglobin: 14.2 g/dL (ref 12.0–15.0)
MCH: 29.3 pg (ref 26.0–34.0)
MCHC: 34.6 g/dL (ref 30.0–36.0)
MCV: 84.5 fL (ref 80.0–100.0)
Platelets: 222 10*3/uL (ref 150–400)
RBC: 4.85 MIL/uL (ref 3.87–5.11)
RDW: 14.2 % (ref 11.5–15.5)
WBC: 24.6 10*3/uL — ABNORMAL HIGH (ref 4.0–10.5)
nRBC: 0 % (ref 0.0–0.2)

## 2019-01-25 LAB — HCG, QUANTITATIVE, PREGNANCY: hCG, Beta Chain, Quant, S: <1 m[IU]/mL (ref <5)

## 2019-01-25 MED ORDER — CEPHALEXIN 500 MG PO CAPS: 500.0000 mg | ORAL_CAPSULE | Freq: Three times a day (TID) | ORAL | 0 refills | Status: AC

## 2019-01-25 MED ORDER — SODIUM CHLORIDE 0.9 % IV SOLN
1.0000 g | Freq: Once | INTRAVENOUS | Status: AC
  Administered 2019-01-25: 1 g via INTRAVENOUS
  Filled 2019-01-25: qty 10

## 2019-01-25 MED ORDER — ONDANSETRON 4 MG PO TBDP: 4.0000 mg | ORAL_TABLET | Freq: Three times a day (TID) | ORAL | 0 refills | Status: DC | PRN

## 2019-01-25 MED ORDER — KETOROLAC TROMETHAMINE 30 MG/ML IJ SOLN
15.0000 mg | Freq: Once | INTRAMUSCULAR | Status: AC
  Administered 2019-01-25: 15 mg via INTRAVENOUS
  Filled 2019-01-25: qty 1

## 2019-01-25 MED ORDER — SODIUM CHLORIDE 0.9 % IV BOLUS
1000.0000 mL | Freq: Once | INTRAVENOUS | Status: AC
  Administered 2019-01-25: 1000 mL via INTRAVENOUS

## 2019-01-25 MED ORDER — ONDANSETRON HCL 4 MG/2ML IJ SOLN
4.0000 mg | Freq: Once | INTRAMUSCULAR | Status: AC
  Administered 2019-01-25: 4 mg via INTRAVENOUS
  Filled 2019-01-25: qty 2

## 2019-01-25 MED ORDER — KETOROLAC TROMETHAMINE 10 MG PO TABS: 10.0000 mg | ORAL_TABLET | Freq: Four times a day (QID) | ORAL | 0 refills | Status: DC | PRN

## 2019-01-25 NOTE — ED Notes (Signed)
First Nurse Note: Patient complaining of pain and nausea, diffuculty urinating, states she thinks she has a kidney stone.

## 2019-01-25 NOTE — Discharge Instructions (Addendum)
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  Take antibiotics as prescribed fully for urinary tract infection.  Return to the emergency room if you have new or worsening flank or abdominal pain, fever, nausea or vomiting.

## 2019-01-25 NOTE — Telephone Encounter (Signed)
Dr Judithann Graves sent Angellee to emergency room and they found a hema ? Found second one in august grew and alarming rate and need an appointment.

## 2019-01-25 NOTE — ED Notes (Signed)
Pt only halfway through [place] bolus; refusing to stay to finish it; educated & agrees to drink lots of water at home.

## 2019-01-25 NOTE — ED Notes (Signed)
Pt ambulatory to toilet stating she needs to urinate.

## 2019-01-25 NOTE — ED Triage Notes (Signed)
Pt arrived via POV with flank pain and has not been able to urinate since last night around midnight.

## 2019-01-25 NOTE — ED Provider Notes (Signed)
[facility] Emergency Department Provider Note  ____________________________________________  Time seen: Approximately 10:14 AM  I have reviewed the triage vital signs and the nursing notes.   HISTORY  Chief Complaint Flank Pain   HPI Amy Gomez is a 38 y.o. female with a history of hypertension, kidney stones and migraine headaches who presents for evaluation of dysuria and flank pain.  Patient reports mild dysuria 7 days ago however those symptoms went away.  Yesterday evening she started having a lot of pressure and feeling like she needs to urinate but very little urine actually coming out.  She has had frequency. She is also complaining of sharp suprapubic abdominal pain and sharp left flank pain which started this morning.  The pain is severe, constant and nonradiating.  She has had nausea and dry heaving but no vomiting.  No vaginal discharge.  No fever but has had chills.  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 Authorizing Provider  lisinopril (PRINIVIL,ZESTRIL) 20 MG tablet Take 1 tablet (20 mg total) by mouth daily. 11/17/18  Yes Reubin Milan, MD  metoprolol succinate (TOPROL-XL) 50 MG 24 hr tablet Take 1 tablet (50 mg total) by mouth daily. Take with or immediately following a meal. 11/17/18  Yes Reubin Milan, MD  topiramate (TOPAMAX) 50 MG tablet  Take 2 tablets (100 mg total) by mouth at bedtime. 08/11/18  Yes Reubin Milan, MD  traMADol (ULTRAM) 50 MG tablet Take 1 tablet (50 mg total) by mouth every 8 (eight) hours as needed. 11/17/18  Yes Reubin Milan, MD  cephALEXin (KEFLEX) 500 MG capsule Take 1 capsule (500 mg total) by mouth 3 (three) times daily for 7 days. 01/25/19 02/01/19  Nita Sickle, MD  ketorolac (TORADOL) 10 MG tablet Take 1 tablet (10 mg total) by mouth every 6 (six) hours as needed. 01/25/19   Nita Sickle, MD  ondansetron (ZOFRAN ODT) 4 MG disintegrating tablet Take 1 tablet (4 mg total) by mouth every 8 (eight) hours as needed. 01/25/19   Nita Sickle, MD    Allergies Sulfa antibiotics  Family History  Problem Relation Age of Onset   Healthy Mother    Healthy Father     Social History Social History   Tobacco Use   Smoking status: Current Every Day Smoker    Packs/day: 0.25    Types: Cigarettes   Smokeless tobacco: Never Used  Substance Use Topics   Alcohol use: No    Alcohol/week: 0.0 standard drinks   Drug use: No    Review of Systems  Constitutional: Negative for fever. Eyes: Negative for visual changes. ENT: Negative for sore throat. Neck: No neck pain  Cardiovascular: Negative for chest pain. Respiratory: Negative for shortness of breath. Gastrointestinal: Negative for abdominal pain, vomiting or diarrhea. + nausea Genitourinary: + dysuria, frequency, L flank pain Musculoskeletal: Negative for back pain. Skin:  Negative for rash. Neurological: Negative for headaches, weakness or numbness. Psych: No SI or HI  ____________________________________________   PHYSICAL EXAM:  VITAL SIGNS: ED Triage Vitals  Enc Vitals Group     BP 01/25/19 0928 126/89     Pulse Rate 01/25/19 0928 93     Resp 01/25/19 0928 16     Temp 01/25/19 0928 97.6 F (36.4 C)     Temp Source 01/25/19 0928 Oral     SpO2 01/25/19 0928 100 %     Weight 01/25/19 0929 149 lb (67.6 kg)      Height 01/25/19 0929 5' (1.524 m)     Head Circumference --      Peak Flow --      Pain Score 01/25/19 0928 10     Pain Loc --      Pain Edu? --      Excl. in GC? --     Constitutional: Alert and oriented. Well appearing and in no apparent distress. HEENT:      Head: Normocephalic and atraumatic.         Eyes: Conjunctivae are normal. Sclera is non-icteric.       Mouth/Throat: Mucous membranes are moist.       Neck: Supple with no signs of meningismus. Cardiovascular: Regular rate and rhythm. No murmurs, gallops, or rubs. 2+ symmetrical distal pulses are present in all extremities. No JVD. Respiratory: Normal respiratory effort. Lungs are clear to auscultation bilaterally. No wheezes, crackles, or rhonchi.  Gastrointestinal: Soft, mild suprapubic tenderness, and non distended with positive bowel sounds. No rebound or guarding. Genitourinary: L CVA tenderness. Musculoskeletal: Nontender with normal range of motion in all extremities. No edema, cyanosis, or erythema of extremities. Neurologic: Normal speech and language. Face is symmetric. Moving all extremities. No gross focal neurologic deficits are appreciated. Skin: Skin is warm, dry and intact. No rash noted. Psychiatric: Mood and affect are normal. Speech and behavior are normal.  ____________________________________________   LABS (all labs ordered are listed, but only abnormal results are displayed)  Labs Reviewed  URINALYSIS, COMPLETE (UACMP) WITH MICROSCOPIC - Abnormal; Notable for the following components:      Result Value   Color, Urine YELLOW (*)    APPearance HAZY (*)    Glucose, UA 50 (*)    Hgb urine dipstick LARGE (*)    Ketones, ur 5 (*)    Protein, ur 30 (*)    Leukocytes,Ua TRACE (*)    RBC / HPF >50 (*)    Bacteria, UA FEW (*)    All other components within normal limits  BASIC METABOLIC PANEL - Abnormal; Notable for the following components:   CO2 19 (*)    Glucose, Bld 165 (*)    All other components  within normal limits  CBC - Abnormal; Notable for the following components:   WBC 24.6 (*)    All other components within normal limits  URINE CULTURE  HCG, QUANTITATIVE, PREGNANCY  POC URINE PREG, ED   ____________________________________________  EKG  none  ____________________________________________  RADIOLOGY  I have personally reviewed the images performed during this visit and I agree with the Radiologist's read.   Interpretation by Radiologist:  Ct Renal Stone Study  Result Date: 01/25/2019 CLINICAL DATA:  Dysuria with pain and nausea EXAM: CT ABDOMEN AND PELVIS WITHOUT CONTRAST TECHNIQUE: Multidetector CT imaging of the abdomen and pelvis was performed following the standard protocol without oral or IV contrast. COMPARISON:  CT abdomen and pelvis June 13, 2018; CT abdomen  and pelvis November 26, 2017; abdominal MRI July 06, 2014 FINDINGS: Lower chest: Lung bases are clear. 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 There is a mass in the right lobe of the liver more posteriorly and inferiorly which measures 5.7 x 5.2 x 4.6 cm. This solid mass was not appreciable on the prior MR from 2015 but is appreciable on the most recent CT. It appears larger currently than on the previous study. No other liver lesions are apparent on this noncontrast enhanced study. Gallbladder wall is not appreciably thickened. There is no biliary duct dilatation. Pancreas: No pancreatic mass or inflammatory focus. Spleen: No splenic lesions are evident. Adrenals/Urinary Tract: Adrenals bilaterally appear normal. There is a cyst in the mid left kidney measuring 2.5 x 2.4 cm. There is no appreciable hydronephrosis on either side. 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 On the right, there is a focal calculus in the mid kidney measuring 5 x 4 mm. In the upper pole of  the right kidney, there is a 3 x 3 mm calculus. There is a 1 mm calculus in the mid right kidney. On the left, there is a 1 mm calculus in the upper pole region. There is a 1 mm calculus in the midportion of the left kidney. There is also a 1 mm calculus in the lower pole left kidney. There is a 2 mm calculus along the posterior wall of the urinary bladder, felt to be slightly distal to the left ureterovesical junction. Urinary bladder wall is not appreciably thickened. Stomach/Bowel: There is no appreciable bowel wall or mesenteric thickening. There is no appreciable bowel obstruction. There is no free air or portal venous air. Vascular/Lymphatic: There is no abdominal aortic aneurysm. No vascular lesions are evident. There is a circumaortic left renal vein, an anatomic variant. No adenopathy is appreciable in the abdomen or pelvis. Reproductive: Uterus is anteverted.  No pelvic mass evident. Other: Appendix appears normal. No evident abscess in the abdomen or pelvis. Musculoskeletal: No blastic or lytic bone lesions. There is no intramuscular or abdominal wall lesion. IMPRESSION: 1. 2 mm calculus within the urinary bladder posteriorly. Suspect recent calculus passage. There are nonobstructing calculi in each kidney. No appreciable hydronephrosis on either side. 2. There are 2 masses within the liver. One of these masses has been characterized previously by MR as due to focal nodular hyperplasia. However, more posteriorly in segment V, there is a solid mass in the liver which was not present on the 2015 MR which is increased in size since August 2019 study. Given concern for potential neoplastic etiology, pre and and serial post contrast MR of the liver to further evaluate this lesion is warranted. If there is a contraindication to MR, serial post-contrast CT of the liver would be an alternative study for further assessment. 3. No bowel obstruction. No abscess in the abdomen or pelvis. Appendix appears normal.  Electronically Signed   By: Bretta Bang III M.D.   On: 01/25/2019 10:54      ____________________________________________   PROCEDURES  Procedure(s) performed: None Procedures Critical Care performed:  None ____________________________________________   INITIAL IMPRESSION / ASSESSMENT AND PLAN / ED COURSE  38 y.o. female with a history of hypertension, kidney stones and migraine headaches who presents for evaluation of dysuria and flank pain.  Patient looks uncomfortable but in no distress, has normal vital signs, abdomen  is soft with mild suprapubic tenderness and left flank tenderness.  Differential diagnoses including pyelonephritis versus kidney stone versus infected obstructing stone.  Labs with white count of 24.6K.  We will get a CT to rule out obstructing stone. Will give toradol, zofran, and IVF  Clinical Course as of Jan 25 1227  Tue Jan 25, 2019  1139 CT shows a recently passed 2 mm kidney stone in the bladder.  No obstructing ureteral stone seen.  Her UA shows blood and few bacteria but no nitrites however with elevated WBC will cover with abx. Leukocytosis also possibly from stress response. Patient reports pain resolved and she feels markedly improved   [CV]    Clinical Course User Index [CV] Don Perking, [place], MD     As part of my medical decision making, I reviewed the following data within the electronic MEDICAL RECORD NUMBER Nursing notes reviewed and incorporated, Labs reviewed , Old chart reviewed, Radiograph reviewed , Notes from prior ED visits and [place] Controlled Substance Database    Pertinent labs & imaging results that were available during my care of the patient were reviewed by me and considered in my medical decision making (see chart for details).    ____________________________________________   FINAL CLINICAL IMPRESSION(S) / ED DIAGNOSES  Final diagnoses:  Kidney stone  Urinary tract infection with hematuria, site unspecified  Liver mass       NEW MEDICATIONS STARTED DURING THIS VISIT:  ED Discharge Orders         Ordered    ketorolac (TORADOL) 10 MG tablet  Every 6 hours PRN     01/25/19 1150    ondansetron (ZOFRAN ODT) 4 MG disintegrating tablet  Every 8 hours PRN     01/25/19 1150    cephALEXin (KEFLEX) 500 MG capsule  3 times daily     01/25/19 1150           Note:  This document was prepared using Dragon voice recognition software and may include unintentional dictation errors.    Don Perking, [place], MD 01/25/19 1228

## 2019-01-25 NOTE — ED Notes (Signed)
Pt requesting a catheter. Informed that staff doesn't place catheters unless medically necessary. Pt states "this is necessary. I can't pee" states last urination was last night. Sitting on toilet attempting to urinate, hat in place. States hx of kidney stones. No stents placed. Ambulatory around room. A&O.

## 2019-01-26 ENCOUNTER — Encounter: Payer: Self-pay | Admitting: Internal Medicine

## 2019-01-26 ENCOUNTER — Other Ambulatory Visit
Admission: RE | Admit: 2019-01-26 | Discharge: 2019-01-26 | Disposition: A | Discharge status: Home / Self Care | Payer: Medicaid Other | Attending: Internal Medicine | Admitting: Internal Medicine

## 2019-01-26 ENCOUNTER — Other Ambulatory Visit: Payer: Self-pay

## 2019-01-26 ENCOUNTER — Ambulatory Visit: Payer: Medicaid Other | Admitting: Internal Medicine

## 2019-01-26 VITALS — BP 122/78 | HR 90 | Ht 60.0 in | Wt 155.0 lb

## 2019-01-26 DIAGNOSIS — R7309 Other abnormal glucose: Secondary | ICD-10-CM

## 2019-01-26 DIAGNOSIS — R7303 Prediabetes: Secondary | ICD-10-CM | POA: Insufficient documentation

## 2019-01-26 DIAGNOSIS — R160 Hepatomegaly, not elsewhere classified: Secondary | ICD-10-CM

## 2019-01-26 DIAGNOSIS — Z87442 Personal history of urinary calculi: Secondary | ICD-10-CM

## 2019-01-26 DIAGNOSIS — F419 Anxiety disorder, unspecified: Secondary | ICD-10-CM | POA: Diagnosis not present

## 2019-01-26 DIAGNOSIS — D72829 Elevated white blood cell count, unspecified: Secondary | ICD-10-CM

## 2019-01-26 LAB — URINE CULTURE

## 2019-01-26 LAB — CBC WITH DIFFERENTIAL/PLATELET
Abs Immature Granulocytes: 0.05 10*3/uL (ref 0.00–0.07)
Basophils Absolute: 0 10*3/uL (ref 0.0–0.1)
Basophils Relative: 0 %
Eosinophils Absolute: 0.1 10*3/uL (ref 0.0–0.5)
Eosinophils Relative: 1 %
HCT: 39.8 % (ref 36.0–46.0)
Hemoglobin: 13.7 g/dL (ref 12.0–15.0)
Immature Granulocytes: 1 %
Lymphocytes Relative: 25 %
Lymphs Abs: 2.5 10*3/uL (ref 0.7–4.0)
MCH: 29.7 pg (ref 26.0–34.0)
MCHC: 34.4 g/dL (ref 30.0–36.0)
MCV: 86.3 fL (ref 80.0–100.0)
Monocytes Absolute: 0.6 10*3/uL (ref 0.1–1.0)
Monocytes Relative: 6 %
Neutro Abs: 6.5 10*3/uL (ref 1.7–7.7)
Neutrophils Relative %: 67 %
Platelets: 211 10*3/uL (ref 150–400)
RBC: 4.61 MIL/uL (ref 3.87–5.11)
RDW: 14.6 % (ref 11.5–15.5)
WBC: 9.7 10*3/uL (ref 4.0–10.5)
nRBC: 0 % (ref 0.0–0.2)

## 2019-01-26 LAB — COMPREHENSIVE METABOLIC PANEL
ALT: 18 U/L (ref 0–44)
AST: 22 U/L (ref 15–41)
Albumin: 4 g/dL (ref 3.5–5.0)
Alkaline Phosphatase: 67 U/L (ref 38–126)
Anion gap: 7 (ref 5–15)
BUN: 11 mg/dL (ref 6–20)
CO2: 23 mmol/L (ref 22–32)
CREATININE: 0.75 mg/dL (ref 0.44–1.00)
Calcium: 9 mg/dL (ref 8.9–10.3)
Chloride: 109 mmol/L (ref 98–111)
GFR calc non Af Amer: >60 mL/min (ref >60)
Glucose, Bld: 108 mg/dL — ABNORMAL HIGH (ref 70–99)
Potassium: 3.8 mmol/L (ref 3.5–5.1)
Sodium: 139 mmol/L (ref 135–145)
Total Bilirubin: 0.6 mg/dL (ref 0.3–1.2)
Total Protein: 7.1 g/dL (ref 6.5–8.1)

## 2019-01-26 LAB — HEMOGLOBIN A1C
Hgb A1c MFr Bld: 5.8 % — ABNORMAL HIGH (ref 4.8–5.6)
MEAN PLASMA GLUCOSE: 119.76 mg/dL

## 2019-01-26 MED ORDER — HYDROXYZINE HCL 25 MG PO TABS: 25.0000 mg | ORAL_TABLET | Freq: Three times a day (TID) | ORAL | 0 refills | Status: DC | PRN

## 2019-01-26 NOTE — Progress Notes (Signed)
Date:  01/26/2019   Name:  Amy Gomez   DOB:  [birth year]   MRN:  [id number]   Chief Complaint: liver mass; Hyperglycemia (A1C); and Anxiety (18- PHQ9 )  Anxiety  Presents for follow-up visit. Symptoms include excessive worry, nervous/anxious behavior and restlessness. Patient reports no chest pain, dizziness or shortness of breath. Symptoms occur most days. Duration: started several weeks ago with a neighbor who is deliberately causing trouble. The severity of symptoms is moderate. The quality of sleep is good.      Liver mass - seen on CT yesterday for stones.  Recommended dedicated MRI of the liver.   She previously had a liver lesion which was focal nodular hyperplasia by MR in 2015.  Renal Stone - seen in UC, passed a stone but had urinary retention.  WBC was very high and glucose was 165.  She was started on keflex for infection. Today she is feeling much better.  Review of Systems  Constitutional: Negative for chills, fatigue and fever.  HENT: Negative for trouble swallowing.   Respiratory: Negative for chest tightness and shortness of breath.   Cardiovascular: Negative for chest pain.  Neurological: Negative for dizziness and headaches.  Psychiatric/Behavioral: The patient is nervous/anxious.     Patient Active Problem List   Diagnosis Date Noted  . Liver mass 01/25/2019  . Leukocytosis 08/16/2018  . Essential hypertension 07/15/2017  . Gastroesophageal reflux disease without esophagitis 12/18/2016  . Renal stone 06/04/2015  . Dysmenorrhea 06/04/2015  . Migraine without aura and responsive to treatment 06/04/2015  . 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 Sulfa Antibiotics Anaphylaxis, Swelling and Rash    Past Surgical History:  Procedure Laterality Date  . DILATION AND CURETTAGE OF UTERUS    . TUBAL LIGATION Bilateral 2007    Social History   Tobacco Use  . Smoking status: Current Every Day Smoker    Packs/day: 0.25   Types: Cigarettes  . Smokeless tobacco: Never Used  Substance Use Topics  . Alcohol use: No    Alcohol/week: 0.0 standard drinks  . Drug use: No     Medication list has been reviewed and updated.  Current Meds  Medication Sig  . cephALEXin (KEFLEX) 500 MG capsule Take 1 capsule (500 mg total) by mouth 3 (three) times daily for 7 days.  Marland Kitchen ketorolac (TORADOL) 10 MG tablet Take 1 tablet (10 mg total) by mouth every 6 (six) hours as needed.  Marland Kitchen lisinopril (PRINIVIL,ZESTRIL) 20 MG tablet Take 1 tablet (20 mg total) by mouth daily.  . metoprolol succinate (TOPROL-XL) 50 MG 24 hr tablet Take 1 tablet (50 mg total) by mouth daily. Take with or immediately following a meal.  . ondansetron (ZOFRAN ODT) 4 MG disintegrating tablet Take 1 tablet (4 mg total) by mouth every 8 (eight) hours as needed.  . topiramate (TOPAMAX) 50 MG tablet Take 2 tablets (100 mg total) by mouth at bedtime.  . traMADol (ULTRAM) 50 MG tablet Take 1 tablet (50 mg total) by mouth every 8 (eight) hours as needed.    PHQ 2/9 Scores 01/26/2019 08/11/2018 07/15/2017  PHQ - 2 Score 1 1 0    Physical Exam Vitals signs and nursing note reviewed.  Constitutional:      General: She is not in acute distress.    Appearance: She is well-developed.  HENT:     Head: Normocephalic and atraumatic.  Neck:     Musculoskeletal: Normal range of motion and neck  supple.  Cardiovascular:     Rate and Rhythm: Normal rate and regular rhythm.     Pulses: Normal pulses.  Pulmonary:     Effort: Pulmonary effort is normal. No respiratory distress.     Breath sounds: Normal breath sounds. No wheezing or rales.  Musculoskeletal: Normal range of motion.     Right lower leg: No edema.     Left lower leg: No edema.  Skin:    General: Skin is warm and dry.     Findings: No rash.  Neurological:     Mental Status: She is alert and oriented to person, place, and time.  Psychiatric:        Attention and Perception: Attention normal.        Mood  and Affect: Mood is anxious.        Speech: Speech normal.        Behavior: Behavior normal.        Thought Content: Thought content normal.        Judgment: Judgment normal.     Wt Readings from Last 3 Encounters:  01/26/19 155 lb (70.3 kg)  01/25/19 149 lb (67.6 kg)  10/29/18 165 lb (74.8 kg)    BP 122/78   Pulse 90   Ht 5' (1.524 m)   Wt 155 lb (70.3 kg)   LMP 01/13/2019 (Within Days)   SpO2 100%   BMI 30.27 kg/m   Assessment and Plan: 1. Liver mass Need dedicated MRI - MR LIVER W WO CONTRAST; Future - Comprehensive metabolic panel  2. Anxiety Begin atarax PRN - hydrOXYzine (ATARAX/VISTARIL) 25 MG tablet; Take 1 tablet (25 mg total) by mouth 3 (three) times daily as needed.  Dispense: 30 tablet; Refill: 0  3. Elevated glucose level Check a1c - Hemoglobin A1c  4. Leukocytosis, unspecified type Recheck labs - CBC with Differential/Platelet  5. History of renal stone Now passed Finish course of antibiotics  Partially dictated using Animal nutritionist. Any errors are unintentional.  Bari Edward, MD [facility]  01/26/2019

## 2019-01-27 ENCOUNTER — Telehealth: Payer: Self-pay

## 2019-01-27 ENCOUNTER — Other Ambulatory Visit: Payer: Self-pay | Admitting: Internal Medicine

## 2019-01-27 DIAGNOSIS — F419 Anxiety disorder, unspecified: Secondary | ICD-10-CM

## 2019-01-27 MED ORDER — BUSPIRONE HCL 10 MG PO TABS: 10.0000 mg | ORAL_TABLET | Freq: Two times a day (BID) | ORAL | 1 refills | Status: DC

## 2019-01-27 NOTE — Telephone Encounter (Signed)
Hydroxyzine is causing her to feel jacked up. Did not sleep at all. Please send response to [facility] box.

## 2019-01-27 NOTE — Telephone Encounter (Signed)
So she is having an idiosyncratic reaction (opposite to what most people have).  Stop taking it.  She should probably go ahead and start on Buspar for anxiety - she will need to take this twice a day.  If she agrees, let me know.

## 2019-01-27 NOTE — Telephone Encounter (Signed)
Called and left VM informing of this,. Told patient to call me back and let me if she agrees to take medication or not.

## 2019-01-27 NOTE — Progress Notes (Signed)
Spoke with patient and informed of labs. Also, she agrees to take Buspar in place of hydroxyzine since she is having a opposite effect to medication. Wants sent to her pharmacy.

## 2019-01-28 ENCOUNTER — Other Ambulatory Visit: Payer: Self-pay

## 2019-01-28 ENCOUNTER — Ambulatory Visit
Admission: RE | Admit: 2019-01-28 | Discharge: 2019-01-28 | Disposition: A | Discharge status: Home / Self Care | Payer: Medicaid Other | Source: Ambulatory Visit | Attending: Internal Medicine | Admitting: Internal Medicine

## 2019-01-28 DIAGNOSIS — R160 Hepatomegaly, not elsewhere classified: Secondary | ICD-10-CM | POA: Diagnosis present

## 2019-01-28 MED ORDER — GADOBUTROL 1 MMOL/ML IV SOLN
7.0000 mL | Freq: Once | INTRAVENOUS | Status: AC | PRN
  Administered 2019-01-28: 7 mL via INTRAVENOUS

## 2019-02-03 ENCOUNTER — Ambulatory Visit (INDEPENDENT_AMBULATORY_CARE_PROVIDER_SITE_OTHER): Payer: Medicaid Other | Admitting: Internal Medicine

## 2019-02-03 ENCOUNTER — Other Ambulatory Visit: Payer: Self-pay

## 2019-02-03 ENCOUNTER — Encounter: Payer: Self-pay | Admitting: Internal Medicine

## 2019-02-03 VITALS — Temp 97.9°F | Ht 60.0 in | Wt 155.0 lb

## 2019-02-03 DIAGNOSIS — I10 Essential (primary) hypertension: Secondary | ICD-10-CM

## 2019-02-03 DIAGNOSIS — R05 Cough: Secondary | ICD-10-CM

## 2019-02-03 DIAGNOSIS — F419 Anxiety disorder, unspecified: Secondary | ICD-10-CM

## 2019-02-03 DIAGNOSIS — F17210 Nicotine dependence, cigarettes, uncomplicated: Secondary | ICD-10-CM | POA: Diagnosis not present

## 2019-02-03 DIAGNOSIS — R059 Cough, unspecified: Secondary | ICD-10-CM

## 2019-02-03 MED ORDER — BUSPIRONE HCL 10 MG PO TABS: 10.0000 mg | ORAL_TABLET | Freq: Three times a day (TID) | ORAL | 1 refills | Status: DC

## 2019-02-03 MED ORDER — METOPROLOL SUCCINATE ER 50 MG PO TB24: 50.0000 mg | ORAL_TABLET | Freq: Every day | ORAL | 1 refills | Status: DC

## 2019-02-03 MED ORDER — LISINOPRIL 20 MG PO TABS: 20.0000 mg | ORAL_TABLET | Freq: Every day | ORAL | 1 refills | Status: DC

## 2019-02-03 NOTE — Progress Notes (Signed)
Date:  02/03/2019   Name:  Amy Gomez   DOB:  12-18-1980   MRN:  [id number]  I connected with this patient, Amy Gomez, by telephone and verified that I am speaking with the correct person using two identifiers. This visit was conducted via phone due to the Covid-19 outbreak. I conducted the telephonic visit from my office at [facility] in [place], [place]. I discussed the limitations, risks, security and privacy concerns of performing an evaluation and management service by telephone. I also discussed with the patient that there may be a patient responsible charge related to this service. The patient expressed understanding and agreed to proceed.  Chief Complaint: Cough (Cough with SOB. No production. No fever. )  Cough  This is a new problem. Episode onset: 3 days ago. The problem has been unchanged. The problem occurs every few hours. The cough is non-productive. Associated symptoms include postnasal drip and shortness of breath (during coughing episodes). Pertinent negatives include no chest pain, chills, fever, headaches or wheezing. Risk factors for lung disease include smoking/tobacco exposure. Treatments tried: vistaril. The treatment provided mild relief. Her past medical history is significant for environmental allergies. no exposure to influenza; children seen for cough and prescribed allergy medication - they have similar cough  Anxiety  Presents for follow-up visit. Symptoms include excessive worry, insomnia, nervous/anxious behavior and shortness of breath (during coughing episodes). Patient reports no chest pain, dizziness or palpitations. Symptoms occur most days. The severity of symptoms is causing significant distress (improving on Buspar twice a day). The quality of sleep is fair.   (No exposure to influenza; children seen for cough and prescribed allergy medication - they have similar cough) Compliance with medications is 76-100% (buspar helps but the effects seem  to wane between doses).  Hypertension  This is a chronic problem. The problem is controlled. Associated symptoms include anxiety and shortness of breath (during coughing episodes). Pertinent negatives include no chest pain, headaches or palpitations. Past treatments include beta blockers and ACE inhibitors (needs refills).   Review of Systems  Constitutional: Negative for chills and fever.  HENT: Positive for postnasal drip. Negative for sinus pressure and trouble swallowing.   Eyes: Negative for visual disturbance.  Respiratory: Positive for cough and shortness of breath (during coughing episodes). Negative for chest tightness and wheezing.   Cardiovascular: Negative for chest pain and palpitations.  Gastrointestinal: Negative for abdominal pain.  Allergic/Immunologic: Positive for environmental allergies.  Neurological: Negative for dizziness, light-headedness and headaches.  Hematological: Negative for adenopathy.  Psychiatric/Behavioral: Negative for dysphoric mood and sleep disturbance. The patient is nervous/anxious and has insomnia.     Patient Active Problem List   Diagnosis Date Noted  . Anxiety 01/26/2019  . Elevated glucose level 01/26/2019  . Liver mass 01/25/2019  . Leukocytosis 08/16/2018  . Essential hypertension 07/15/2017  . Gastroesophageal reflux disease without esophagitis 12/18/2016  . Renal stone 06/04/2015  . Dysmenorrhea 06/04/2015  . Migraine without aura and responsive to treatment 06/04/2015  . 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 Sulfa Antibiotics Anaphylaxis, Swelling and Rash    Past Surgical History:  Procedure Laterality Date  . DILATION AND CURETTAGE OF UTERUS    . TUBAL LIGATION Bilateral 2007    Social History   Tobacco Use  . Smoking status: Current Every Day Smoker    Packs/day: 0.25    Types: Cigarettes  . Smokeless tobacco: Never Used  Substance Use Topics  . Alcohol use:  No    Alcohol/week: 0.0  standard drinks  . Drug use: No     Medication list has been reviewed and updated.  Current Meds  Medication Sig  . busPIRone (BUSPAR) 10 MG tablet Take 1 tablet (10 mg total) by mouth 2 (two) times daily.  . hydrOXYzine (ATARAX/VISTARIL) 25 MG tablet Take 1 tablet (25 mg total) by mouth 3 (three) times daily as needed.  Marland Kitchen lisinopril (PRINIVIL,ZESTRIL) 20 MG tablet Take 1 tablet (20 mg total) by mouth daily.  . metoprolol succinate (TOPROL-XL) 50 MG 24 hr tablet Take 1 tablet (50 mg total) by mouth daily. Take with or immediately following a meal.  . ondansetron (ZOFRAN ODT) 4 MG disintegrating tablet Take 1 tablet (4 mg total) by mouth every 8 (eight) hours as needed.  . topiramate (TOPAMAX) 50 MG tablet Take 2 tablets (100 mg total) by mouth at bedtime.  . traMADol (ULTRAM) 50 MG tablet Take 1 tablet (50 mg total) by mouth every 8 (eight) hours as needed.    PHQ 2/9 Scores 02/03/2019 01/26/2019 08/11/2018 07/15/2017  PHQ - 2 Score 0 1 1 0    Physical Exam  No exam done due to telephone visit. She sounds normal, no voice change, no cough during our encounter, no audible wheezes or evidence of shortness of breath. Pt took her temperature last night and it was normal.  Wt Readings from Last 3 Encounters:  02/03/19 155 lb (70.3 kg)  01/26/19 155 lb (70.3 kg)  01/25/19 149 lb (67.6 kg)   BP Readings from Last 3 Encounters:  01/26/19 122/78  01/25/19 114/74  10/29/18 (!) 149/105    Temp 97.9 F (36.6 C)   Ht 5' (1.524 m)   Wt 155 lb (70.3 kg)   LMP 01/28/2019   BMI 30.27 kg/m   Assessment and Plan: 1. Cough Likely allergic/tobacco related Recommend zyrtec daily Work on cutting back on smoking  2. Anxiety Will increase Buspar to tid - busPIRone (BUSPAR) 10 MG tablet; Take 1 tablet (10 mg total) by mouth 3 (three) times daily.  Dispense: 270 tablet; Refill: 1  3. Essential hypertension Controlled; continue current medication. Regular exercise, healthy diet and weight  loss are beneficial in helping with control of HTN - lisinopril (PRINIVIL,ZESTRIL) 20 MG tablet; Take 1 tablet (20 mg total) by mouth daily.  Dispense: 90 tablet; Refill: 1 - metoprolol succinate (TOPROL-XL) 50 MG 24 hr tablet; Take 1 tablet (50 mg total) by mouth daily. Take with or immediately following a meal.  Dispense: 90 tablet; Refill: 1  I spent 12 minutes on this telephone encounter. Partially dictated using Animal nutritionist. Any errors are unintentional.  Bari Edward, MD [facility]  02/03/2019

## 2019-03-15 ENCOUNTER — Telehealth: Payer: Self-pay

## 2019-03-15 ENCOUNTER — Other Ambulatory Visit: Payer: Self-pay | Admitting: Internal Medicine

## 2019-03-15 DIAGNOSIS — N200 Calculus of kidney: Secondary | ICD-10-CM

## 2019-03-15 MED ORDER — TAMSULOSIN HCL 0.4 MG PO CAPS: 0.4000 mg | ORAL_CAPSULE | Freq: Every day | ORAL | 0 refills | Status: DC

## 2019-03-15 NOTE — Telephone Encounter (Signed)
Cephalexin is an antibiotic.  It would not do anything for a kidney stone unless she thinks she has a UTI.

## 2019-03-15 NOTE — Telephone Encounter (Signed)
Patient called saying she knows she is passing a kidney stone. She has done this several times in the past. Wants to know if she can have cephalexin Rx. Unsure of the name for sure but she thinks is right in what she is requesting.   Please advise?

## 2019-03-15 NOTE — Telephone Encounter (Signed)
Patient meant to say Toradol. Said she was given this from the hospital at the last visit. Spoke with Dr Judithann Graves and informed patient we cannot prescribe that but she will need to take her tramadol as needed for her kidney stone and can send in flomax. Patient agreed.  Needs flomax sent in.

## 2019-03-17 ENCOUNTER — Ambulatory Visit (INDEPENDENT_AMBULATORY_CARE_PROVIDER_SITE_OTHER): Payer: Medicaid Other | Admitting: Internal Medicine

## 2019-03-17 ENCOUNTER — Encounter: Payer: Self-pay | Admitting: Internal Medicine

## 2019-03-17 ENCOUNTER — Other Ambulatory Visit: Payer: Self-pay

## 2019-03-17 VITALS — BP 128/82 | HR 90 | Ht 60.0 in | Wt 163.0 lb

## 2019-03-17 DIAGNOSIS — R300 Dysuria: Secondary | ICD-10-CM | POA: Insufficient documentation

## 2019-03-17 DIAGNOSIS — I10 Essential (primary) hypertension: Secondary | ICD-10-CM | POA: Diagnosis not present

## 2019-03-17 DIAGNOSIS — F419 Anxiety disorder, unspecified: Secondary | ICD-10-CM | POA: Diagnosis not present

## 2019-03-17 DIAGNOSIS — N200 Calculus of kidney: Secondary | ICD-10-CM

## 2019-03-17 LAB — POC URINALYSIS WITH MICROSCOPIC (NON AUTO)MANUAL RESULT
Bilirubin, UA: NEGATIVE
Blood, UA: NEGATIVE
Crystals: 0
Epithelial cells, urine per micros: 5
Glucose, UA: NEGATIVE
Ketones, UA: NEGATIVE
Leukocytes, UA: NEGATIVE
Mucus, UA: 0
Nitrite, UA: NEGATIVE
Protein, UA: NEGATIVE
RBC: 0 M/uL — AB (ref 4.04–5.48)
Spec Grav, UA: <=1.005 — AB (ref 1.010–1.025)
Urobilinogen, UA: 0.2 E.U./dL
WBC Casts, UA: 0
pH, UA: 6.5 (ref 5.0–8.0)

## 2019-03-17 MED ORDER — HYDROXYZINE HCL 25 MG PO TABS: 25.0000 mg | ORAL_TABLET | Freq: Three times a day (TID) | ORAL | 5 refills | Status: DC | PRN

## 2019-03-17 NOTE — Progress Notes (Signed)
Date:  03/17/2019   Name:  Amy Gomez   DOB:  12/25/1980   MRN:  [id number]   Chief Complaint: Urinary Urgency (Patient passed kidney stone two days ago. It still having spotting and urgency to use the restroom. Wants to be sure there is no infection or UTI. Said she may be trying to pass another stone. Starting hurting again last night. Now having difficulty urinating. )  Dysuria   This is a new problem. Episode onset: 3 days ago. The problem occurs every urination. The problem has been unchanged. The quality of the pain is described as burning. The pain is mild. There has been no fever. She is sexually active. Associated symptoms include frequency, nausea and urgency. Pertinent negatives include no chills, hematuria or vomiting. Associated symptoms comments: Recently passed a kidney stone . Her past medical history is significant for kidney stones.  Anxiety  Presents for follow-up visit. Symptoms include nausea. Patient reports no chest pain, dizziness, palpitations or shortness of breath. Symptoms occur occasionally. The quality of sleep is good.   Compliance with medications is 76-100% (also helps with allergy sx).  Hypertension  The problem is controlled. Associated symptoms include anxiety and headaches. Pertinent negatives include no chest pain, palpitations or shortness of breath. Past treatments include beta blockers and ACE inhibitors.  She thinks that she may have passed 2 stones from the left about three weeks ago but that was minimal pain.  Three days ago she had more severe pain on the right flank that moved to the bladder yesterday.  She has been having some urgency and hesitancy but no blood in the urine, no fever or chills.  She has taken AZO and yesterday started Flomax which has helped her void.  CT 01/2019: Adrenals/Urinary Tract: Adrenals bilaterally appear normal. There is a cyst in the mid left kidney measuring 2.5 x 2.4 cm. There is no appreciable hydronephrosis  on either side. 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 On the right, there is a focal calculus in the mid kidney measuring 5 x 4 mm. In the upper pole of the right kidney, there is a 3 x 3 mm calculus. There is a 1 mm calculus in the mid right kidney. On the left, there is a 1 mm calculus in the upper pole region. There is a 1 mm calculus in the midportion of the left kidney. There is also a 1 mm calculus in the lower pole left kidney. There is a 2 mm calculus along the posterior wall of the urinary bladder, felt to be slightly distal to the left ureterovesical junction. Urinary bladder wall is not appreciably thickened.  Review of Systems  Constitutional: Negative for chills, fatigue and fever.  HENT: Positive for congestion and sinus pressure.   Respiratory: Negative for cough, chest tightness, shortness of breath and wheezing.   Cardiovascular: Negative for chest pain and palpitations.  Gastrointestinal: Positive for nausea. Negative for diarrhea and vomiting.  Genitourinary: Positive for dysuria, frequency, pelvic pain and urgency. Negative for hematuria.  Neurological: Positive for headaches. Negative for dizziness.  Psychiatric/Behavioral: Negative for sleep disturbance.    Patient Active Problem List   Diagnosis Date Noted  . Dysuria 03/17/2019  . Anxiety 01/26/2019  . Elevated glucose level 01/26/2019  . Liver mass 01/25/2019  . Leukocytosis 08/16/2018  . Essential hypertension 07/15/2017  . Gastroesophageal reflux disease without esophagitis 12/18/2016  . Renal stone 06/04/2015  . Dysmenorrhea 06/04/2015  . Migraine without aura and responsive to treatment 06/04/2015  .  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 Sulfa Antibiotics Anaphylaxis, Swelling and Rash    Past Surgical History:  Procedure Laterality Date  . DILATION AND CURETTAGE OF UTERUS    . TUBAL LIGATION Bilateral 2007    Social History   Tobacco Use  . Smoking status: Current  Every Day Smoker    Packs/day: 1.00    Types: Cigarettes  . Smokeless tobacco: Never Used  Substance Use Topics  . Alcohol use: No    Alcohol/week: 0.0 standard drinks  . Drug use: No     Medication list has been reviewed and updated.  Current Meds  Medication Sig  . busPIRone (BUSPAR) 10 MG tablet Take 1 tablet (10 mg total) by mouth 3 (three) times daily.  . hydrOXYzine (ATARAX/VISTARIL) 25 MG tablet Take 1 tablet (25 mg total) by mouth 3 (three) times daily as needed.  Marland Kitchen lisinopril (PRINIVIL,ZESTRIL) 20 MG tablet Take 1 tablet (20 mg total) by mouth daily.  . metoprolol succinate (TOPROL-XL) 50 MG 24 hr tablet Take 1 tablet (50 mg total) by mouth daily. Take with or immediately following a meal.  . ondansetron (ZOFRAN ODT) 4 MG disintegrating tablet Take 1 tablet (4 mg total) by mouth every 8 (eight) hours as needed.  . tamsulosin (FLOMAX) 0.4 MG CAPS capsule Take 1 capsule (0.4 mg total) by mouth daily.  Marland Kitchen topiramate (TOPAMAX) 50 MG tablet Take 2 tablets (100 mg total) by mouth at bedtime.  . traMADol (ULTRAM) 50 MG tablet Take 1 tablet (50 mg total) by mouth every 8 (eight) hours as needed.  . [DISCONTINUED] hydrOXYzine (ATARAX/VISTARIL) 25 MG tablet Take 1 tablet (25 mg total) by mouth 3 (three) times daily as needed.    PHQ 2/9 Scores 03/17/2019 02/03/2019 01/26/2019 08/11/2018  PHQ - 2 Score 1 0 1 1    BP Readings from Last 3 Encounters:  03/17/19 128/82  01/26/19 122/78  01/25/19 114/74    Physical Exam Vitals signs and nursing note reviewed.  Constitutional:      General: She is not in acute distress.    Appearance: She is well-developed.  HENT:     Head: Normocephalic and atraumatic.  Neck:     Musculoskeletal: Normal range of motion and neck supple.  Cardiovascular:     Rate and Rhythm: Normal rate and regular rhythm.  Pulmonary:     Effort: Pulmonary effort is normal. No respiratory distress.     Breath sounds: No wheezing or rhonchi.  Abdominal:      General: Bowel sounds are normal.     Palpations: Abdomen is soft.     Tenderness: There is abdominal tenderness in the suprapubic area. There is left CVA tenderness. There is no right CVA tenderness, guarding or rebound.  Musculoskeletal: Normal range of motion.  Lymphadenopathy:     Cervical: No cervical adenopathy.  Skin:    General: Skin is warm and dry.     Findings: No rash.  Neurological:     Mental Status: She is alert and oriented to person, place, and time.  Psychiatric:        Behavior: Behavior normal.        Thought Content: Thought content normal.     Wt Readings from Last 3 Encounters:  03/17/19 163 lb (73.9 kg)  02/03/19 155 lb (70.3 kg)  01/26/19 155 lb (70.3 kg)    BP 128/82   Pulse 90   Ht 5' (1.524 m)   Wt 163 lb (73.9  kg)   SpO2 97%   BMI 31.83 kg/m   Assessment and Plan: 1. Renal stone Suspect she passed at least 3 - the largest this week Continue Flomax for another 5-10 days Take Tramadol as needed for severe pain Can continue AZO intermittently if needed - POC urinalysis w microscopic (non auto)  2. Dysuria No evidence of infection - no antibiotics needed - POC urinalysis w microscopic (non auto)  3. Anxiety Refill medication - hydrOXYzine (ATARAX/VISTARIL) 25 MG tablet; Take 1 tablet (25 mg total) by mouth 3 (three) times daily as needed.  Dispense: 30 tablet; Refill: 5  4. Essential hypertension controlled   Partially dictated using Animal nutritionist. Any errors are unintentional.  Bari Edward, MD [facility]  03/17/2019

## 2019-03-29 ENCOUNTER — Telehealth: Payer: Self-pay

## 2019-03-29 NOTE — Telephone Encounter (Signed)
Patient called saying her brother came to her house to stay the night a few days ago. He called her today and told her that he travel to [place] last week for a few days to see his girlfriend.  When he was traveling back, his girlfriend called him and told him that she tested positive for coronavirus 3 weeks before seeing him. She was not sick anymore when he visited her.   Her brother is not sick either or having any symptoms. She is concerned now because she is having a dry cough but no other symptoms. Her husband has a fever for one night of 100 and then the fever broke the next day. Her husband has always had a cough so this is not abnormal that he is coughing no. Nobody in her home has a fever now, cough with SOB, or any loss of smell. Her son has asthma, so she also wanted to get advise.  Informed her that if she does not have a fever with her cough or some SOB then we cannot send her to be tested. Told her to monitor her symptoms 14 days out to when her brother came in her home. If she gets sick, told her to call here and we will send her and/or her husband to be tested due to possible exposure to the virus. Told her to avoid kissing her children on the face, or coughing near them.  She verbalized understanding and said if her or her husband become sick to call the office back.

## 2019-04-27 ENCOUNTER — Telehealth: Payer: Self-pay

## 2019-04-27 NOTE — Telephone Encounter (Signed)
Patient called requesting RF on tramadol. Also, she seen information about Emgality and wanted to know your opinion on this and if this is something she should try.  Please Advise.

## 2019-04-27 NOTE — Telephone Encounter (Signed)
She might be a good candidate for Emgality but I would need an OV to specifically address migraines.

## 2019-04-28 NOTE — Telephone Encounter (Signed)
Called patient and left VM informing to callback and schedule an OV as she might be a good candidate for this medication.

## 2019-04-29 ENCOUNTER — Other Ambulatory Visit: Payer: Self-pay | Admitting: Internal Medicine

## 2019-04-29 DIAGNOSIS — G43009 Migraine without aura, not intractable, without status migrainosus: Secondary | ICD-10-CM

## 2019-05-03 ENCOUNTER — Ambulatory Visit (INDEPENDENT_AMBULATORY_CARE_PROVIDER_SITE_OTHER): Payer: Medicaid Other | Admitting: Internal Medicine

## 2019-05-03 ENCOUNTER — Encounter: Payer: Self-pay | Admitting: Internal Medicine

## 2019-05-03 ENCOUNTER — Other Ambulatory Visit: Payer: Self-pay

## 2019-05-03 VITALS — BP 118/68 | HR 96 | Ht 60.0 in | Wt 162.0 lb

## 2019-05-03 DIAGNOSIS — I10 Essential (primary) hypertension: Secondary | ICD-10-CM

## 2019-05-03 DIAGNOSIS — G43009 Migraine without aura, not intractable, without status migrainosus: Secondary | ICD-10-CM | POA: Diagnosis not present

## 2019-05-03 MED ORDER — EMGALITY 120 MG/ML ~~LOC~~ SOAJ: 120.0000 mg | SUBCUTANEOUS | 5 refills | Status: DC

## 2019-05-03 NOTE — Progress Notes (Signed)
Date:  05/03/2019   Name:  Amy Gomez Nicole Mcelhiney   DOB:  03/17/1981   MRN:  [id number]   Chief Complaint: Migraine (Wants to discuss Emgality)  Migraine  This is a recurrent problem. The current episode started more than 1 year ago (around age 38). The problem occurs daily. The problem has been gradually worsening. The pain is located in the right unilateral region. The pain quality is similar to prior headaches. The pain is moderate. Associated symptoms include nausea, photophobia and vomiting. She has tried triptans and beta blockers (and topamax) for the symptoms. The treatment provided no relief (currently taking tramadol PRN). Her past medical history is significant for hypertension.  Hypertension This is a chronic problem. The problem is controlled. Past treatments include beta blockers and ACE inhibitors. The current treatment provides significant improvement.    Review of Systems  Eyes: Positive for photophobia.  Gastrointestinal: Positive for nausea and vomiting.    Patient Active Problem List   Diagnosis Date Noted  . Anxiety 01/26/2019  . Pre-diabetes 01/26/2019  . Liver mass 01/25/2019  . Essential hypertension 07/15/2017  . Gastroesophageal reflux disease without esophagitis 12/18/2016  . History of kidney stones 06/04/2015  . Dysmenorrhea 06/04/2015  . Migraine without aura and responsive to treatment 06/04/2015    Allergies  Allergen Reactions  . Sulfa Antibiotics Anaphylaxis, Swelling and Rash    Past Surgical History:  Procedure Laterality Date  . DILATION AND CURETTAGE OF UTERUS    . TUBAL LIGATION Bilateral 2007    Social History   Tobacco Use  . Smoking status: Current Every Day Smoker    Packs/day: 1.00    Types: Cigarettes  . Smokeless tobacco: Never Used  Substance Use Topics  . Alcohol use: No    Alcohol/week: 0.0 standard drinks  . Drug use: No     Medication list has been reviewed and updated.  Current Meds  Medication Sig  .  busPIRone (BUSPAR) 10 MG tablet Take 1 tablet (10 mg total) by mouth 3 (three) times daily.  . hydrOXYzine (ATARAX/VISTARIL) 25 MG tablet Take 1 tablet (25 mg total) by mouth 3 (three) times daily as needed.  Marland Kitchen. lisinopril (PRINIVIL,ZESTRIL) 20 MG tablet Take 1 tablet (20 mg total) by mouth daily.  . metoprolol succinate (TOPROL-XL) 50 MG 24 hr tablet Take 1 tablet (50 mg total) by mouth daily. Take with or immediately following a meal.  . ondansetron (ZOFRAN ODT) 4 MG disintegrating tablet Take 1 tablet (4 mg total) by mouth every 8 (eight) hours as needed.  . tamsulosin (FLOMAX) 0.4 MG CAPS capsule Take 1 capsule (0.4 mg total) by mouth daily.  Marland Kitchen. topiramate (TOPAMAX) 50 MG tablet Take 2 tablets (100 mg total) by mouth at bedtime.  . traMADol (ULTRAM) 50 MG tablet TAKE 1 TABLET BY MOUTH EVERY 8 HOURS AS NEEDED    PHQ 2/9 Scores 05/03/2019 03/17/2019 02/03/2019 01/26/2019  PHQ - 2 Score 3 1 0 1  PHQ- 9 Score 7 - - -    BP Readings from Last 3 Encounters:  05/03/19 118/68  03/17/19 128/82  01/26/19 122/78    Physical Exam Vitals signs and nursing note reviewed.  Constitutional:      General: She is not in acute distress.    Appearance: She is well-developed.  HENT:     Head: Normocephalic and atraumatic.  Neck:     Musculoskeletal: Normal range of motion and neck supple.  Cardiovascular:     Rate and Rhythm: Normal rate  and regular rhythm.     Pulses: Normal pulses.     Heart sounds: No murmur.  Pulmonary:     Effort: Pulmonary effort is normal. No respiratory distress.     Breath sounds: No wheezing or rhonchi.  Musculoskeletal: Normal range of motion.     Right lower leg: No edema.     Left lower leg: No edema.  Skin:    General: Skin is warm and dry.     Findings: No rash.  Neurological:     Mental Status: She is alert and oriented to person, place, and time.     Cranial Nerves: Cranial nerves are intact.     Sensory: Sensation is intact.     Motor: Motor function is  intact.     Coordination: Coordination is intact.  Psychiatric:        Behavior: Behavior normal.        Thought Content: Thought content normal.     Wt Readings from Last 3 Encounters:  05/03/19 162 lb (73.5 kg)  03/17/19 163 lb (73.9 kg)  02/03/19 155 lb (70.3 kg)    BP 118/68   Pulse 96   Ht 5' (1.524 m)   Wt 162 lb (73.5 kg)   SpO2 97%   BMI 31.64 kg/m   Assessment and Plan: 1. Migraine without aura and responsive to treatment Loading dose given in office in the back of each arm per patient preference  Lot number [id number] EA Exp 12/2019 Pt cautioned about dizziness and to limit driving - Galcanezumab-gnlm (EMGALITY) 120 MG/ML SOAJ; Inject 120 mg into the skin every 30 (thirty) days.  Dispense: 1 pen; Refill: 5  2. Essential hypertension Controlled Cut metoprolol in half and take 1/2 daily = 25 mg   Partially dictated using Editor, commissioning. Any errors are unintentional.  Halina Maidens, MD [facility]  05/03/2019

## 2019-05-03 NOTE — Patient Instructions (Signed)
Cut metoprolol in half - take only a half per day

## 2019-06-02 ENCOUNTER — Telehealth: Payer: Self-pay

## 2019-06-02 ENCOUNTER — Other Ambulatory Visit: Payer: Self-pay | Admitting: Internal Medicine

## 2019-06-02 NOTE — Telephone Encounter (Signed)
Since she has had such a great response to the medication, I would like for her to continue it.   The next dose will be a lower one as a maintenance so hopefully she will continue to do well.  She should closely monitor her hair loss, avoid chemicals, tight hair bands,etc but if it continues, she may need to see a Dermatologist.  I am not aware of hair loss being a side effect of the medication.

## 2019-06-02 NOTE — Telephone Encounter (Signed)
Patient called saying she was told to follow up after 1 month with Emgality. Patient is complaining of losing hair in clumps. She does have a small bald spot in her scalp. She said at about 2 weeks after injection she noticed her headaches were getting fewer and fewer and now she does not have many. Does not want to stop the medication due to this but wants to do what you recommend. Of course, her only fear is losing too much hair.  Please advise for final opinion. I told her that her first dose was higher than mantinence dose/ next dose will be.

## 2019-06-03 NOTE — Telephone Encounter (Signed)
Called and spoke with pt. Informed of this. She will continue Emgality. Will call if hair loss continues to worsen and we will refer to a dermatologist. Told her this is not a likely side effect of this medication.  She verbalized understanding.

## 2019-06-06 ENCOUNTER — Telehealth: Payer: Self-pay

## 2019-06-06 NOTE — Telephone Encounter (Signed)
Patient called requesting advise. Having heavy painful periods for months now. This past Saturday she was in so much pain she was vomiting, had diarrhea, chills, and heavy bleeding. She said that endometriosis runs heavy in her mothers family.   Wants to know if she should go to see a obgyn for this or come see Dr. Army Melia.   Called and informed patient it will be best to see GYN since they specialize in this and this is where Dr. Army Melia would most likely have to send her.   She verbalized understanding and will call obgyn.

## 2019-06-15 ENCOUNTER — Encounter: Payer: Medicaid Other | Admitting: Obstetrics and Gynecology

## 2019-07-05 ENCOUNTER — Telehealth: Payer: Self-pay

## 2019-07-05 NOTE — Telephone Encounter (Signed)
Coronavirus (COVID-19) Are you at risk?  Are you at risk for the Coronavirus (COVID-19)?  To be considered HIGH RISK for Coronavirus (COVID-19), you have to meet the following criteria:  . Traveled to [place], [place], [place], [place] or [place]; or in the [place] to [place], [place], [place], or [place]; and have fever, cough, and shortness of breath within the last 2 weeks of travel OR . Been in close contact with a person diagnosed with COVID-19 within the last 2 weeks and have fever, cough, and shortness of breath . IF YOU DO NOT MEET THESE CRITERIA, YOU ARE CONSIDERED LOW RISK FOR COVID-19.  What to do if you are HIGH RISK for COVID-19?  . If you are having a medical emergency, call 911. . Seek medical care right away. Before you go to a doctor's office, urgent care or emergency department, call ahead and tell them about your recent travel, contact with someone diagnosed with COVID-19, and your symptoms. You should receive instructions from your physician's office regarding next steps of care.  . When you arrive at healthcare provider, tell the healthcare staff immediately you have returned from visiting [place], [place], [place], [place] or [place]; or traveled in the [place] to [place], [place], [place], or [place]; in the last two weeks or you have been in close contact with a person diagnosed with COVID-19 in the last 2 weeks.   . Tell the health care staff about your symptoms: fever, cough and shortness of breath. . After you have been seen by a medical provider, you will be either: o Tested for (COVID-19) and discharged home on quarantine except to seek medical care if symptoms worsen, and asked to  - Stay home and avoid contact with others until you get your results (4-5 days)  - Avoid travel on public transportation if possible (such as bus, train, or airplane) or o Sent to the Emergency Department by EMS for evaluation, COVID-19 testing, and possible  admission depending on your condition and test results.  What to do if you are LOW RISK for COVID-19?  Reduce your risk of any infection by using the same precautions used for avoiding the common cold or flu:  . Wash your hands often with soap and warm water for at least 20 seconds.  If soap and water are not readily available, use an alcohol-based hand sanitizer with at least 60% alcohol.  . If coughing or sneezing, cover your mouth and nose by coughing or sneezing into the elbow areas of your shirt or coat, into a tissue or into your sleeve (not your hands). . Avoid shaking hands with others and consider head nods or verbal greetings only. . Avoid touching your eyes, nose, or mouth with unwashed hands.  . Avoid close contact with people who are Amy Gomez. . Avoid places or events with large numbers of people in one location, like concerts or sporting events. . Carefully consider travel plans you have or are making. . If you are planning any travel outside or inside the [place], visit the CDC's Travelers' Health webpage for the latest health notices. . If you have some symptoms but not all symptoms, continue to monitor at home and seek medical attention if your symptoms worsen. . If you are having a medical emergency, call 911.  07/05/19 SCREENING NEG SLS ADDITIONAL HEALTHCARE OPTIONS FOR PATIENTS  [facility] / e-Visit: [email or web address]         [facility]: [phone]    [facility]: [phone]                   [facility]: [phone]  

## 2019-07-06 ENCOUNTER — Other Ambulatory Visit: Payer: Self-pay

## 2019-07-06 ENCOUNTER — Encounter: Payer: Self-pay | Admitting: Obstetrics and Gynecology

## 2019-07-06 ENCOUNTER — Ambulatory Visit (INDEPENDENT_AMBULATORY_CARE_PROVIDER_SITE_OTHER): Payer: Medicaid Other | Admitting: Obstetrics and Gynecology

## 2019-07-06 VITALS — BP 123/80 | HR 99 | Ht 60.0 in | Wt 158.9 lb

## 2019-07-06 DIAGNOSIS — N921 Excessive and frequent menstruation with irregular cycle: Secondary | ICD-10-CM | POA: Diagnosis not present

## 2019-07-06 NOTE — Progress Notes (Signed)
HPI:      [name] is a 38 y.o. No obstetric history on file. who LMP was Patient's last menstrual period was 07/01/2019.  Subjective:   She presents today stating that over the last several months her menstrual periods have gotten heavier and she sometimes has 2 menstrual periods per month.  She is mostly distressed because last month she had a very heavy 10-day menses and then a week later had another menses for 7 days. She reports that last year all of her cycles were regular monthly and lasting approximately 5 days.   She has a tubal ligation for birth control Of significant note she smokes cigarettes Patient states that she has noted a slight increase in hair growth in the last year.  She also believes that someone" once told her" that she had polycystic ovaries.  (She has never skipped multiple menstrual periods)    Hx: The following portions of the patient's history were reviewed and updated as appropriate:             She  has a past medical history of Hypertension, Kidney stones, Leukocytosis (08/16/2018), and Migraine. She does not have any pertinent problems on file. She  has a past surgical history that includes Tubal ligation (Bilateral, 2007) and Dilation and curettage of uterus. Her family history includes Endometriosis in her maternal grandmother; Healthy in her father and mother. She  reports that she has been smoking cigarettes. She has been smoking about 1.00 pack per day. She has never used smokeless tobacco. She reports that she does not drink alcohol or use drugs. She has a current medication list which includes the following prescription(s): buspirone, emgality, hydroxyzine, lisinopril, ondansetron, topiramate, tramadol, metoprolol succinate, and tamsulosin. She is allergic to sulfa antibiotics.       Review of Systems:  Review of Systems  Constitutional: Denied constitutional symptoms, night sweats, recent illness, fatigue, fever, insomnia and weight  loss.  Eyes: Denied eye symptoms, eye pain, photophobia, vision change and visual disturbance.  Ears/Nose/Throat/Neck: Denied ear, nose, throat or neck symptoms, hearing loss, nasal discharge, sinus congestion and sore throat.  Cardiovascular: Denied cardiovascular symptoms, arrhythmia, chest pain/pressure, edema, exercise intolerance, orthopnea and palpitations.  Respiratory: Denied pulmonary symptoms, asthma, pleuritic pain, productive sputum, cough, dyspnea and wheezing.  Gastrointestinal: Denied, gastro-esophageal reflux, melena, nausea and vomiting.  Genitourinary: See HPI for additional information.  Musculoskeletal: Denied musculoskeletal symptoms, stiffness, swelling, muscle weakness and myalgia.  Dermatologic: Denied dermatology symptoms, rash and scar.  Neurologic: Denied neurology symptoms, dizziness, headache, neck pain and syncope.  Psychiatric: Denied psychiatric symptoms, anxiety and depression.  Endocrine: Denied endocrine symptoms including hot flashes and night sweats.   Meds:   Current Outpatient Medications on File Prior to Visit  Medication Sig Dispense Refill  . busPIRone (BUSPAR) 10 MG tablet Take 1 tablet (10 mg total) by mouth 3 (three) times daily. 270 tablet 1  . Galcanezumab-gnlm (EMGALITY) 120 MG/ML SOAJ Inject 120 mg into the skin every 30 (thirty) days. 1 pen 5  . hydrOXYzine (ATARAX/VISTARIL) 25 MG tablet Take 1 tablet (25 mg total) by mouth 3 (three) times daily as needed. 30 tablet 5  . lisinopril (PRINIVIL,ZESTRIL) 20 MG tablet Take 1 tablet (20 mg total) by mouth daily. 90 tablet 1  . ondansetron (ZOFRAN ODT) 4 MG disintegrating tablet Take 1 tablet (4 mg total) by mouth every 8 (eight) hours as needed. 20 tablet 0  . topiramate (TOPAMAX) 50 MG tablet Take 2 tablets (100 mg total) by  mouth at bedtime. 180 tablet 1  . traMADol (ULTRAM) 50 MG tablet TAKE 1 TABLET BY MOUTH EVERY 8 HOURS AS NEEDED 90 tablet 0  . metoprolol succinate (TOPROL-XL) 50 MG 24 hr  tablet Take 1 tablet (50 mg total) by mouth daily. Take with or immediately following a meal. (Patient not taking: Reported on 07/06/2019) 90 tablet 1  . tamsulosin (FLOMAX) 0.4 MG CAPS capsule Take 1 capsule (0.4 mg total) by mouth daily. (Patient not taking: Reported on 07/06/2019) 30 capsule 0   No current facility-administered medications on file prior to visit.     Objective:     Vitals:   07/06/19 1428  BP: 123/80  Pulse: 99                Assessment:    No obstetric history on file. Patient Active Problem List   Diagnosis Date Noted  . Anxiety 01/26/2019  . Pre-diabetes 01/26/2019  . Liver mass 01/25/2019  . Essential hypertension 07/15/2017  . Gastroesophageal reflux disease without esophagitis 12/18/2016  . History of kidney stones 06/04/2015  . Dysmenorrhea 06/04/2015  . Migraine without aura and responsive to treatment 06/04/2015     1. Menometrorrhagia     Possible history of PCO   Plan:            1.  Ultrasound to rule out fibroids and examine the ovaries for possible PCO  2.  Consider PCO labs  3.  Plan non-OCP hormonal cycle control for future management of irregular cycles if appropriate. Orders Orders Placed This Encounter  Procedures  . US PELVIS (TRANSABDOMINAL ONLY)    No orders of the defined types were placed in this encounter.     F/U  Return for We will contact her with any abnormal test results. I spent 32 minutes involved in the care of this patient of which greater than 50% was spent discussing patient's history of irregular cycles including prior childbirth, possible history of PCO, recent history of hair growth, method of diagnosis of PCO and uterine fibroids possible future treatments discussed.  All questions answered  Finis Bud, M.D. 07/06/2019 3:00 PM

## 2019-07-06 NOTE — Progress Notes (Signed)
Patient comes in today as new patient. She is having heavy cycles that come on every two weeks. She has not been on BC to control periods.

## 2019-07-11 ENCOUNTER — Other Ambulatory Visit: Payer: Self-pay

## 2019-07-11 ENCOUNTER — Encounter: Payer: Self-pay | Admitting: Internal Medicine

## 2019-07-11 ENCOUNTER — Ambulatory Visit (INDEPENDENT_AMBULATORY_CARE_PROVIDER_SITE_OTHER): Payer: Medicaid Other | Admitting: Internal Medicine

## 2019-07-11 VITALS — BP 102/68 | HR 78 | Ht 60.0 in | Wt 160.0 lb

## 2019-07-11 DIAGNOSIS — F4001 Agoraphobia with panic disorder: Secondary | ICD-10-CM | POA: Diagnosis not present

## 2019-07-11 DIAGNOSIS — M79671 Pain in right foot: Secondary | ICD-10-CM

## 2019-07-11 DIAGNOSIS — G43009 Migraine without aura, not intractable, without status migrainosus: Secondary | ICD-10-CM

## 2019-07-11 DIAGNOSIS — M79672 Pain in left foot: Secondary | ICD-10-CM | POA: Diagnosis not present

## 2019-07-11 MED ORDER — SERTRALINE HCL 50 MG PO TABS: 50.0000 mg | ORAL_TABLET | Freq: Every day | ORAL | 1 refills | Status: DC

## 2019-07-11 NOTE — Progress Notes (Signed)
Date:  07/11/2019   Name:  Amy Gomez   DOB:  1980/11/25   MRN:  [id number]   Chief Complaint: Anxiety (18-GAD7- PHQ9- 21 - Pt says buproprion is not helping. ) and Foot Pain (Bilateral foot pain. When wearing tennis shoes - it causes foot pain under toes, into ankles, and pain radiates up to knees. Sometimes she has arch and heel pain.)  She reports taking Zoloft for several years as a teenager for depression/anxiety/bipolar.  She says that she seemed to recover and was able to stop the medication once she got married and started her family. She does recall that she did very well on medication at that time.  Anxiety Presents for follow-up visit. Symptoms include decreased concentration, depressed mood, insomnia, nervous/anxious behavior and restlessness. Patient reports no chest pain, dizziness, palpitations, shortness of breath or suicidal ideas. Symptoms occur constantly. The severity of symptoms is causing significant distress.   Compliance with medications is 0-25% (taking Buspar).  Foot Pain This is a new problem. The current episode started more than 1 month ago. The problem occurs daily. Associated symptoms include arthralgias. Pertinent negatives include no chest pain, chills, coughing, fatigue, fever or headaches. Exacerbated by: tennis shoes.    Review of Systems  Constitutional: Negative for chills, fatigue and fever.  Respiratory: Negative for cough, chest tightness, shortness of breath and wheezing.   Cardiovascular: Negative for chest pain, palpitations and leg swelling.  Musculoskeletal: Positive for arthralgias and gait problem.  Neurological: Negative for dizziness, light-headedness and headaches.  Psychiatric/Behavioral: Positive for decreased concentration, dysphoric mood and sleep disturbance. Negative for hallucinations, self-injury and suicidal ideas. The patient is nervous/anxious and has insomnia. The patient is not hyperactive.     Patient Active Problem  List   Diagnosis Date Noted  . Anxiety 01/26/2019  . Pre-diabetes 01/26/2019  . Liver mass 01/25/2019  . Essential hypertension 07/15/2017  . Gastroesophageal reflux disease without esophagitis 12/18/2016  . History of kidney stones 06/04/2015  . Dysmenorrhea 06/04/2015  . Migraine without aura and responsive to treatment 06/04/2015    Allergies  Allergen Reactions  . Sulfa Antibiotics Anaphylaxis, Swelling and Rash    Past Surgical History:  Procedure Laterality Date  . DILATION AND CURETTAGE OF UTERUS    . TUBAL LIGATION Bilateral 2007    Social History   Tobacco Use  . Smoking status: Current Every Day Smoker    Packs/day: 1.00    Types: Cigarettes  . Smokeless tobacco: Never Used  Substance Use Topics  . Alcohol use: No    Alcohol/week: 0.0 standard drinks  . Drug use: No     Medication list has been reviewed and updated.  Current Meds  Medication Sig  . busPIRone (BUSPAR) 10 MG tablet Take 1 tablet (10 mg total) by mouth 3 (three) times daily.  . Galcanezumab-gnlm (EMGALITY) 120 MG/ML SOAJ Inject 120 mg into the skin every 30 (thirty) days.  Marland Kitchen lisinopril (PRINIVIL,ZESTRIL) 20 MG tablet Take 1 tablet (20 mg total) by mouth daily.  . ondansetron (ZOFRAN ODT) 4 MG disintegrating tablet Take 1 tablet (4 mg total) by mouth every 8 (eight) hours as needed.  . topiramate (TOPAMAX) 50 MG tablet Take 2 tablets (100 mg total) by mouth at bedtime.  . traMADol (ULTRAM) 50 MG tablet TAKE 1 TABLET BY MOUTH EVERY 8 HOURS AS NEEDED    PHQ 2/9 Scores 07/11/2019 05/03/2019 03/17/2019 02/03/2019  PHQ - 2 Score 6 3 1  0  PHQ- 9 Score 21 7 - -  GAD 7 : Generalized Anxiety Score 07/11/2019 01/26/2019  Nervous, Anxious, on Edge 3 3  Control/stop worrying 3 3  Worry too much - different things 3 3  Trouble relaxing 3 3  Restless 0 0  Easily annoyed or irritable 3 3  Afraid - awful might happen 3 3  Total GAD 7 Score 18 18  Anxiety Difficulty Extremely difficult Extremely difficult       BP Readings from Last 3 Encounters:  07/11/19 102/68  07/06/19 123/80  05/03/19 118/68    Physical Exam Vitals signs and nursing note reviewed.  Constitutional:      General: She is not in acute distress.    Appearance: She is well-developed.  HENT:     Head: Normocephalic and atraumatic.  Neck:     Musculoskeletal: Normal range of motion and neck supple.  Cardiovascular:     Rate and Rhythm: Normal rate and regular rhythm.     Pulses: Normal pulses.  Pulmonary:     Effort: Pulmonary effort is normal. No respiratory distress.  Musculoskeletal: Normal range of motion.     Right lower leg: No edema.     Left lower leg: No edema.     Comments: Bilateral foot pain - no edema Tender over forefoot, over achilles tendon ROM of ankle intact  Skin:    General: Skin is warm and dry.     Capillary Refill: Capillary refill takes less than 2 seconds.     Findings: No rash.  Neurological:     General: No focal deficit present.     Mental Status: She is alert and oriented to person, place, and time.  Psychiatric:        Attention and Perception: Attention normal.        Mood and Affect: Mood is depressed.        Behavior: Behavior normal.        Thought Content: Thought content normal.     Wt Readings from Last 3 Encounters:  07/11/19 160 lb (72.6 kg)  07/06/19 158 lb 14.4 oz (72.1 kg)  05/03/19 162 lb (73.5 kg)    BP 102/68   Pulse 78   Ht 5' (1.524 m)   Wt 160 lb (72.6 kg)   LMP 07/01/2019   SpO2 97%   BMI 31.25 kg/m   Assessment and Plan: 1. Panic disorder with agoraphobia Causing serious distress at this time but with no suicidal thoughts or intent.   Will start Sertraline and have follow up in one month, sooner if needed - sertraline (ZOLOFT) 50 MG tablet; Take 1 tablet (50 mg total) by mouth daily.  Dispense: 30 tablet; Refill: 1  2. Bilateral foot pain Uncertain cause since bilateral Acts like Morton's neuroma but doubt that would be bilaterally so  will refer to Podiatry Continue Advil and comfortable shoes - Ambulatory referral to Podiatry   Partially dictated using Dragon software. Any errors are unintentional.  Bari EdwardLaura Shauniece Kwan, MD [facility]  07/11/2019

## 2019-07-13 ENCOUNTER — Ambulatory Visit (INDEPENDENT_AMBULATORY_CARE_PROVIDER_SITE_OTHER): Payer: Medicaid Other

## 2019-07-13 ENCOUNTER — Other Ambulatory Visit: Payer: Self-pay

## 2019-07-13 DIAGNOSIS — N921 Excessive and frequent menstruation with irregular cycle: Secondary | ICD-10-CM

## 2019-07-15 ENCOUNTER — Other Ambulatory Visit: Payer: Self-pay | Admitting: Internal Medicine

## 2019-07-15 DIAGNOSIS — G43009 Migraine without aura, not intractable, without status migrainosus: Secondary | ICD-10-CM

## 2019-08-08 ENCOUNTER — Telehealth: Payer: Self-pay

## 2019-08-08 MED ORDER — NURTEC 75 MG PO TBDP: 1.0000 | ORAL_TABLET | ORAL | 1 refills | Status: DC | PRN

## 2019-08-08 NOTE — Telephone Encounter (Signed)
Patient called saying her last dose of Emgality a month ago caused her to have severe tremors. Wants to try new medication called [name]- dissolvable tablet. Patient also started back on Metoprolol last week as well. And tramadol as needed. She does not like the side effects of tramadol and prefers something else.

## 2019-08-09 ENCOUNTER — Telehealth: Payer: Self-pay

## 2019-08-09 ENCOUNTER — Telehealth: Payer: Self-pay | Admitting: Obstetrics and Gynecology

## 2019-08-09 DIAGNOSIS — G43009 Migraine without aura, not intractable, without status migrainosus: Secondary | ICD-10-CM

## 2019-08-09 NOTE — Telephone Encounter (Signed)
Completed PA with [employer] over the phone for patient medication [name] 75 mg ODT tabs #10 for 30 days.   They said this medication PA will be sent for review and there is a 24 hour turn around time for this. Called and informed pt.   CB# to check on PA is [phone]  PA# [id number] REF# for call [id number]

## 2019-08-09 NOTE — Telephone Encounter (Signed)
Patient called stating she has not received results from ultrasound done on 07/13/19. Patient also states she has had continued bleeding since the visit. Please Advise.

## 2019-08-10 NOTE — Telephone Encounter (Signed)
Please advise on results

## 2019-08-11 ENCOUNTER — Telehealth: Payer: Self-pay

## 2019-08-11 NOTE — Telephone Encounter (Signed)
Spoke with patient and let her know [place] results. She stated that she is still having bleeding every couple days. She will call back next week to schedule once she has her schedule for work.Marland Kitchen

## 2019-08-11 NOTE — Telephone Encounter (Signed)
Then we need to send her Maxalt to try as needed for migraines.

## 2019-08-11 NOTE — Telephone Encounter (Signed)
Called and checked on PA for patient [name] medication. The PA has been DENIED. The patient has to try and fail Fioricet and 2 or more Triptans medications. She also cannot have more than 15 headaches in a month.  Please advise.

## 2019-08-12 ENCOUNTER — Ambulatory Visit: Payer: Medicaid Other | Admitting: Internal Medicine

## 2019-08-12 ENCOUNTER — Other Ambulatory Visit: Payer: Self-pay | Admitting: Internal Medicine

## 2019-08-12 ENCOUNTER — Other Ambulatory Visit: Payer: Self-pay

## 2019-08-12 DIAGNOSIS — G43009 Migraine without aura, not intractable, without status migrainosus: Secondary | ICD-10-CM

## 2019-08-12 MED ORDER — SUMATRIPTAN SUCCINATE 50 MG PO TABS: 50.0000 mg | ORAL_TABLET | ORAL | 0 refills | Status: DC | PRN

## 2019-08-12 NOTE — Telephone Encounter (Signed)
Called and informed pt. Told her to call after a few weeks and let us know how she is doing on medication. Told her to call back if she has any questions.

## 2019-08-12 NOTE — Telephone Encounter (Signed)
She tried maxalt in the past- only worked for a short period of time and then stopped working. Willing to try another Triptan if needed. Never tried Imitrex.

## 2019-08-12 NOTE — Telephone Encounter (Signed)
I sent in imitrex.

## 2019-08-23 ENCOUNTER — Other Ambulatory Visit: Payer: Self-pay

## 2019-08-23 ENCOUNTER — Encounter: Payer: Self-pay | Admitting: Internal Medicine

## 2019-08-23 ENCOUNTER — Ambulatory Visit (INDEPENDENT_AMBULATORY_CARE_PROVIDER_SITE_OTHER): Payer: Medicaid Other | Admitting: Internal Medicine

## 2019-08-23 VITALS — BP 124/62 | HR 77 | Ht 60.0 in | Wt 161.0 lb

## 2019-08-23 DIAGNOSIS — F4001 Agoraphobia with panic disorder: Secondary | ICD-10-CM | POA: Diagnosis not present

## 2019-08-23 DIAGNOSIS — G43009 Migraine without aura, not intractable, without status migrainosus: Secondary | ICD-10-CM

## 2019-08-23 MED ORDER — TRAMADOL HCL 50 MG PO TABS: 50.0000 mg | ORAL_TABLET | Freq: Three times a day (TID) | ORAL | 0 refills | Status: DC | PRN

## 2019-08-23 MED ORDER — NARATRIPTAN HCL 2.5 MG PO TABS: 2.5000 mg | ORAL_TABLET | ORAL | 0 refills | Status: DC | PRN

## 2019-08-23 NOTE — Progress Notes (Signed)
Date:  08/23/2019   Name:  Stephonie Gomez   DOB:  May 20, 1981   MRN:  [id number]   Chief Complaint: Anxiety  Anxiety Presents for follow-up visit. Patient reports no chest pain, confusion, decreased concentration, depressed mood, dizziness, excessive worry, nervous/anxious behavior, palpitations, restlessness or shortness of breath. Symptoms occur rarely. The severity of symptoms is mild (much improved on zoloft and buspar). The quality of sleep is good. Nighttime awakenings: none.    Migraine  This is a recurrent problem. The problem occurs intermittently. The pain does not radiate. The pain quality is similar to prior headaches. Pertinent negatives include no back pain, dizziness or fever.  She did great with Emgality except for tremors.  She has now tried imitrex which does not last more than one hour and she gets nausea.  She did not have response to Maxalt, topamax.  She takes excedrin migraine for milder headaches.  She is open to trying another triptan and samples of [name].  Review of Systems  Constitutional: Negative for chills, diaphoresis, fatigue and fever.  Eyes: Negative for visual disturbance.  Respiratory: Negative for chest tightness, shortness of breath and wheezing.   Cardiovascular: Negative for chest pain, palpitations and leg swelling.  Musculoskeletal: Negative for arthralgias and back pain.  Skin: Negative for color change and rash.  Neurological: Positive for headaches. Negative for dizziness and tremors.  Psychiatric/Behavioral: Negative for confusion and decreased concentration. The patient is not nervous/anxious.     Patient Active Problem List   Diagnosis Date Noted  . Panic disorder with agoraphobia 07/11/2019  . Pre-diabetes 01/26/2019  . Liver mass 01/25/2019  . Essential hypertension 07/15/2017  . Gastroesophageal reflux disease without esophagitis 12/18/2016  . History of kidney stones 06/04/2015  . Dysmenorrhea 06/04/2015  . Migraine  without aura and without status migrainosus, not intractable 06/04/2015    Allergies  Allergen Reactions  . Sulfa Antibiotics Anaphylaxis, Swelling and Rash    Past Surgical History:  Procedure Laterality Date  . DILATION AND CURETTAGE OF UTERUS    . TUBAL LIGATION Bilateral 2007    Social History   Tobacco Use  . Smoking status: Current Every Day Smoker    Packs/day: 1.00    Types: Cigarettes  . Smokeless tobacco: Never Used  Substance Use Topics  . Alcohol use: No    Alcohol/week: 0.0 standard drinks  . Drug use: No     Medication list has been reviewed and updated.  Current Meds  Medication Sig  . busPIRone (BUSPAR) 10 MG tablet Take 1 tablet (10 mg total) by mouth 3 (three) times daily.  Marland Kitchen lisinopril (PRINIVIL,ZESTRIL) 20 MG tablet Take 1 tablet (20 mg total) by mouth daily.  . metoprolol succinate (TOPROL-XL) 50 MG 24 hr tablet Take 50 mg by mouth daily. Take with or immediately following a meal.  . ondansetron (ZOFRAN ODT) 4 MG disintegrating tablet Take 1 tablet (4 mg total) by mouth every 8 (eight) hours as needed.  . sertraline (ZOLOFT) 50 MG tablet Take 1 tablet (50 mg total) by mouth daily.  . SUMAtriptan (IMITREX) 50 MG tablet Take 1 tablet (50 mg total) by mouth every 2 (two) hours as needed for migraine. May repeat in 2 hours if headache persists or recurs.  . topiramate (TOPAMAX) 50 MG tablet TAKE 2 TABLETS BY MOUTH AT BEDTIME    PHQ 2/9 Scores 08/23/2019 07/11/2019 05/03/2019 03/17/2019  PHQ - 2 Score 0 6 3 1   PHQ- 9 Score 8 21 7  -  GAD 7 : Generalized Anxiety Score 08/23/2019 07/11/2019 01/26/2019  Nervous, Anxious, on Edge 0 3 3  Control/stop worrying 0 3 3  Worry too much - different things 0 3 3  Trouble relaxing 1 3 3   Restless 0 0 0  Easily annoyed or irritable 0 3 3  Afraid - awful might happen 0 3 3  Total GAD 7 Score 1 18 18   Anxiety Difficulty Not difficult at all Extremely difficult Extremely difficult      BP Readings from Last 3  Encounters:  08/23/19 124/62  07/11/19 102/68  07/06/19 123/80    Physical Exam Vitals signs and nursing note reviewed.  Constitutional:      General: She is not in acute distress.    Appearance: She is well-developed.  HENT:     Head: Normocephalic and atraumatic.  Neck:     Musculoskeletal: Normal range of motion and neck supple.  Cardiovascular:     Rate and Rhythm: Normal rate and regular rhythm.     Pulses: Normal pulses.     Heart sounds: No murmur.  Pulmonary:     Effort: Pulmonary effort is normal. No respiratory distress.     Breath sounds: No wheezing or rhonchi.  Musculoskeletal: Normal range of motion.     Right lower leg: No edema.     Left lower leg: No edema.  Skin:    General: Skin is warm and dry.     Findings: No rash.  Neurological:     Mental Status: She is alert and oriented to person, place, and time.  Psychiatric:        Behavior: Behavior normal.        Thought Content: Thought content normal.     Wt Readings from Last 3 Encounters:  08/23/19 161 lb (73 kg)  07/11/19 160 lb (72.6 kg)  07/06/19 158 lb 14.4 oz (72.1 kg)    BP 124/62   Pulse 77   Ht 5' (1.524 m)   Wt 161 lb (73 kg)   LMP 08/09/2019 (Exact Date)   SpO2 98%   BMI 31.44 kg/m   Assessment and Plan: 1. Panic disorder with agoraphobia Clinically much improved with zoloft 50 mg. She is able to work and and enjoy her job at 07/08/19 current therapy.  2. Migraine without aura and without status migrainosus, not intractable Failed several triptans, excedrin migraine, topamax and emgality. Will try Amerge to get more long lasting benefit Also samples of [name] to try before attempting PA Tramadol for headaches that do not respond to planned tx - naratriptan (AMERGE) 2.5 MG tablet; Take 1 tablet (2.5 mg total) by mouth as needed for migraine. Take one (1) tablet at onset of headache; if returns or does not resolve, may repeat after 4 hours; do not exceed five (5) mg in 24  hours.  Dispense: 10 tablet; Refill: 0 - traMADol (ULTRAM) 50 MG tablet; Take 1 tablet (50 mg total) by mouth every 8 (eight) hours as needed.  Dispense: 20 tablet; Refill: 0   Partially dictated using 08/11/2019. Any errors are unintentional.  [place], MD [facility]  08/23/2019

## 2019-08-25 ENCOUNTER — Telehealth: Payer: Self-pay

## 2019-08-25 ENCOUNTER — Other Ambulatory Visit: Payer: Self-pay

## 2019-08-25 NOTE — Telephone Encounter (Signed)
Completed PA for pt medication Naratriptan 2.5 mg tab #10 for 30 days.  PA# [id number]  Call ID: [id number]  Was told it will be sent for review before getting approved or denied. Awaiting PA outcome. Can call after 24hours to see if we can get this approved.  CB# [phone] [place] [facility]

## 2019-08-26 NOTE — Telephone Encounter (Signed)
PA Approved from 08/25/2019-08/19/2020. Called and informed patient.

## 2019-09-05 ENCOUNTER — Other Ambulatory Visit: Payer: Self-pay

## 2019-09-05 DIAGNOSIS — Z20822 Contact with and (suspected) exposure to covid-19: Secondary | ICD-10-CM

## 2019-09-06 LAB — NOVEL CORONAVIRUS, NAA: SARS-CoV-2, NAA: NOT DETECTED

## 2019-09-09 ENCOUNTER — Other Ambulatory Visit: Payer: Self-pay

## 2019-09-09 DIAGNOSIS — F4001 Agoraphobia with panic disorder: Secondary | ICD-10-CM

## 2019-09-09 MED ORDER — SERTRALINE HCL 50 MG PO TABS: 50.0000 mg | ORAL_TABLET | Freq: Every day | ORAL | 1 refills | Status: DC

## 2019-09-19 ENCOUNTER — Other Ambulatory Visit: Payer: Self-pay | Admitting: Internal Medicine

## 2019-09-19 DIAGNOSIS — G43009 Migraine without aura, not intractable, without status migrainosus: Secondary | ICD-10-CM

## 2019-09-19 MED ORDER — NARATRIPTAN HCL 2.5 MG PO TABS: 2.5000 mg | ORAL_TABLET | ORAL | 5 refills | Status: DC | PRN

## 2019-09-24 ENCOUNTER — Other Ambulatory Visit: Payer: Self-pay

## 2019-09-24 ENCOUNTER — Emergency Department: Payer: Medicaid Other

## 2019-09-24 ENCOUNTER — Encounter: Payer: Self-pay | Admitting: Emergency Medicine

## 2019-09-24 ENCOUNTER — Emergency Department
Admission: EM | Admit: 2019-09-24 | Discharge: 2019-09-24 | Disposition: A | Discharge status: Home / Self Care | Payer: Medicaid Other | Attending: Emergency Medicine | Admitting: Emergency Medicine

## 2019-09-24 DIAGNOSIS — F419 Anxiety disorder, unspecified: Secondary | ICD-10-CM | POA: Insufficient documentation

## 2019-09-24 DIAGNOSIS — R079 Chest pain, unspecified: Secondary | ICD-10-CM | POA: Insufficient documentation

## 2019-09-24 DIAGNOSIS — Z79899 Other long term (current) drug therapy: Secondary | ICD-10-CM | POA: Diagnosis not present

## 2019-09-24 DIAGNOSIS — F17210 Nicotine dependence, cigarettes, uncomplicated: Secondary | ICD-10-CM | POA: Insufficient documentation

## 2019-09-24 DIAGNOSIS — I10 Essential (primary) hypertension: Secondary | ICD-10-CM | POA: Diagnosis not present

## 2019-09-24 LAB — CBC
HCT: 40.3 % (ref 36.0–46.0)
Hemoglobin: 14 g/dL (ref 12.0–15.0)
MCH: 30.2 pg (ref 26.0–34.0)
MCHC: 34.7 g/dL (ref 30.0–36.0)
MCV: 87 fL (ref 80.0–100.0)
Platelets: 300 10*3/uL (ref 150–400)
RBC: 4.63 MIL/uL (ref 3.87–5.11)
RDW: 13.3 % (ref 11.5–15.5)
WBC: 11.6 10*3/uL — ABNORMAL HIGH (ref 4.0–10.5)
nRBC: 0 % (ref 0.0–0.2)

## 2019-09-24 LAB — BASIC METABOLIC PANEL
Anion gap: 8 (ref 5–15)
BUN: 13 mg/dL (ref 6–20)
CO2: 22 mmol/L (ref 22–32)
Calcium: 8.9 mg/dL (ref 8.9–10.3)
Chloride: 110 mmol/L (ref 98–111)
Creatinine, Ser: 0.72 mg/dL (ref 0.44–1.00)
GFR calc Af Amer: >60 mL/min (ref >60)
GFR calc non Af Amer: >60 mL/min (ref >60)
Glucose, Bld: 101 mg/dL — ABNORMAL HIGH (ref 70–99)
Potassium: 3.9 mmol/L (ref 3.5–5.1)
Sodium: 140 mmol/L (ref 135–145)

## 2019-09-24 LAB — LIPASE, BLOOD: Lipase: 32 U/L (ref 11–51)

## 2019-09-24 LAB — TROPONIN I (HIGH SENSITIVITY): Troponin I (High Sensitivity): <2 ng/L (ref <18)

## 2019-09-24 MED ORDER — ALUM & MAG HYDROXIDE-SIMETH 200-200-20 MG/5ML PO SUSP
30.0000 mL | Freq: Once | ORAL | Status: AC
  Administered 2019-09-24: 30 mL via ORAL
  Filled 2019-09-24: qty 30

## 2019-09-24 MED ORDER — LIDOCAINE VISCOUS HCL 2 % MT SOLN
15.0000 mL | Freq: Once | OROMUCOSAL | Status: AC
  Administered 2019-09-24: 15:00:00 15 mL via ORAL
  Filled 2019-09-24: qty 15

## 2019-09-24 MED ORDER — LORAZEPAM 0.5 MG PO TABS
0.5000 mg | ORAL_TABLET | Freq: Once | ORAL | Status: AC
  Administered 2019-09-24: 0.5 mg via ORAL
  Filled 2019-09-24: qty 1

## 2019-09-24 MED ORDER — OMEPRAZOLE 10 MG PO CPDR: 10.0000 mg | DELAYED_RELEASE_CAPSULE | Freq: Every day | ORAL | 0 refills | Status: DC

## 2019-09-24 NOTE — ED Notes (Signed)
Pt dry, calmly sitting in bed, on phone with family.

## 2019-09-24 NOTE — ED Notes (Signed)
States pan has returned, repeat EKG done.

## 2019-09-24 NOTE — ED Notes (Signed)
Pt requesting ativan prescription from now until her appointment with her PCP Monday. EDP Kinner messaged with pt's request. Awaiting orders.

## 2019-09-24 NOTE — ED Provider Notes (Signed)
[facility] Emergency Department Provider Note  ____________________________________________  Time seen: Approximately 2:12 PM  I have reviewed the triage vital signs and the nursing notes.   HISTORY  Chief Complaint Chest Pain    HPI Amy Gomez is a 38 y.o. female who presents the emergency department complaining of intermittent left chest pain radiating to the back and her neck.  Patient states that she began with intermittent pain yesterday.  Patient reports that she does not have any cardiac history, does have anxiety, panic attacks and thought that given recent stressful events she was having increased anxiety.  Patient became concerned today as the pain has continued and is radiating into her back/shoulder region.  Patient describes it as a tight sensation.  No radiation down the arm.  No shortness of breath, coughing.  No fevers or chills, nasal congestion or sore throat.  Patient has endorse increased stress, anxiety recently given Covid pandemic, home situation and issues at her job.  Patient has also had increased belching, reflux symptoms over the past week.  Patient started Topamax which has given her intermittent diarrhea as well.  She denies any hematic emesis.  No hematochezia.  Patient denies any urinary symptoms.  No abdominal pain.  Patient does not try any medications for his complaint prior to arrival.         Past Medical History:  Diagnosis Date  . Hypertension   . Kidney stones   . Leukocytosis 08/16/2018  . Migraine     Patient Active Problem List   Diagnosis Date Noted  . Panic disorder with agoraphobia 07/11/2019  . Pre-diabetes 01/26/2019  . Liver mass 01/25/2019  . Essential hypertension 07/15/2017  . Gastroesophageal reflux disease without esophagitis 12/18/2016  . History of kidney stones 06/04/2015  . Dysmenorrhea 06/04/2015  . Migraine without aura and without status migrainosus, not intractable 06/04/2015     Past Surgical History:  Procedure Laterality Date  . DILATION AND CURETTAGE OF UTERUS    . TUBAL LIGATION Bilateral 2007    Prior to Admission medications   Medication Sig Start Date End Date Taking? Authorizing Provider  busPIRone (BUSPAR) 10 MG tablet Take 1 tablet (10 mg total) by mouth 3 (three) times daily. 02/03/19   Reubin Milan, MD  hydrOXYzine (ATARAX/VISTARIL) 25 MG tablet Take 1 tablet (25 mg total) by mouth 3 (three) times daily as needed. Patient not taking: Reported on 07/11/2019 03/17/19   Reubin Milan, MD  lisinopril (PRINIVIL,ZESTRIL) 20 MG tablet Take 1 tablet (20 mg total) by mouth daily. 02/03/19   Reubin Milan, MD  metoprolol succinate (TOPROL-XL) 50 MG 24 hr tablet Take 50 mg by mouth daily. Take with or immediately following a meal.    [provider]  naratriptan (AMERGE) 2.5 MG tablet Take 1 tablet (2.5 mg total) by mouth as needed for migraine. Take one (1) tablet at onset of headache; if returns or does not resolve, may repeat after 4 hours; do not exceed five (5) mg in 24 hours. 09/19/19   Reubin Milan, MD  omeprazole (PRILOSEC) 10 MG capsule Take 1 capsule (10 mg total) by mouth daily. 09/24/19   Yuriana Gaal, Delorise Royals, PA-C  ondansetron (ZOFRAN ODT) 4 MG disintegrating tablet Take 1 tablet (4 mg total) by mouth every 8 (eight) hours as needed. 01/25/19   Nita Sickle, MD  sertraline (ZOLOFT) 50 MG tablet Take 1 tablet (50 mg total) by mouth daily. 09/09/19   Reubin Milan, MD  topiramate (  TOPAMAX) 50 MG tablet TAKE 2 TABLETS BY MOUTH AT BEDTIME 07/15/19   Reubin MilanBerglund, Laura H, MD  traMADol (ULTRAM) 50 MG tablet Take 1 tablet (50 mg total) by mouth every 8 (eight) hours as needed. 08/23/19   Reubin MilanBerglund, Laura H, MD    Allergies Sulfa antibiotics  Family History  Problem Relation Age of Onset  . Healthy Mother   . Healthy Father   . Endometriosis Maternal Grandmother     Social History Social History   Tobacco Use  . Smoking  status: Current Every Day Smoker    Packs/day: 1.00    Types: Cigarettes  . Smokeless tobacco: Never Used  Substance Use Topics  . Alcohol use: No    Alcohol/week: 0.0 standard drinks  . Drug use: No     Review of Systems  Constitutional: No fever/chills Eyes: No visual changes. No discharge ENT: No upper respiratory complaints. Cardiovascular: Intermittent left-sided chest pain radiating to the shoulder/neck region. Respiratory: no cough. No SOB. Gastrointestinal: Increased heartburn symptoms.  No abdominal pain.  No nausea, no vomiting.  Positive diarrhea.  No constipation. Genitourinary: Negative for dysuria. No hematuria Musculoskeletal: Negative for musculoskeletal pain. Skin: Negative for rash, abrasions, lacerations, ecchymosis. Neurological: Negative for headaches, focal weakness or numbness. 10-point ROS otherwise negative.  ____________________________________________   PHYSICAL EXAM:  VITAL SIGNS: ED Triage Vitals [09/24/19 1231]  Enc Vitals Group     BP 113/75     Pulse Rate 89     Resp 16     Temp 98.7 F (37.1 C)     Temp Source Oral     SpO2 100 %     Weight 158 lb (71.7 kg)     Height 5' (1.524 m)     Head Circumference      Peak Flow      Pain Score 5     Pain Loc      Pain Edu?      Excl. in GC?      Constitutional: Alert and oriented. Well appearing and in no acute distress. Eyes: Conjunctivae are normal. PERRL. EOMI. Head: Atraumatic. ENT:      Ears:       Nose: No congestion/rhinnorhea.      Mouth/Throat: Mucous membranes are moist.  Oropharynx is nonerythematous and nonedematous.  Uvula is midline. Neck: No stridor.  Neck is supple full range of motion Hematological/Lymphatic/Immunilogical: No cervical lymphadenopathy. Cardiovascular: Normal rate, regular rhythm. Normal S1 and S2.  No murmurs, rubs, gallops.  No apical heave.  Good peripheral circulation. Respiratory: Normal respiratory effort without tachypnea or retractions. Lungs  CTAB. Good air entry to the bases with no decreased or absent breath sounds. Gastrointestinal: Bowel sounds 4 quadrants. Soft and nontender to palpation. No guarding or rigidity. No palpable masses. No distention. No CVA tenderness. Musculoskeletal: Full range of motion to all extremities. No gross deformities appreciated. Neurologic:  Normal speech and language. No gross focal neurologic deficits are appreciated.  Skin:  Skin is warm, dry and intact. No rash noted. Psychiatric: Mood and affect are normal. Speech and behavior are normal. Patient exhibits appropriate insight and judgement.   ____________________________________________   LABS (all labs ordered are listed, but only abnormal results are displayed)  Labs Reviewed  BASIC METABOLIC PANEL - Abnormal; Notable for the following components:      Result Value   Glucose, Bld 101 (*)    All other components within normal limits  CBC - Abnormal; Notable for the following components:   WBC  11.6 (*)    All other components within normal limits  LIPASE, BLOOD  POC URINE PREG, ED  TROPONIN I (HIGH SENSITIVITY)   ____________________________________________  EKG  ED ECG REPORT I, Charline Bills Natalea Sutliff,  personally viewed and interpreted this ECG.   Date: 09/24/2019  EKG Time: 1230  Rate: 91  Rhythm: normal EKG, normal sinus rhythm, there are no previous tracings available for comparison  Axis: Normal axis  Intervals:none  ST&T Change: No ST elevation or depression noted  ____________________________________________  RADIOLOGY I personally viewed and evaluated these images as part of my medical decision making, as well as reviewing the written report by the radiologist.   Dg Chest 2 View  Result Date: 09/24/2019 CLINICAL DATA:  Chest pain EXAM: CHEST - 2 VIEW COMPARISON:  None. FINDINGS: The heart size and mediastinal contours are within normal limits. Both lungs are clear. The visualized skeletal structures are  unremarkable. IMPRESSION: No acute abnormality of the lungs. Electronically Signed   By: Eddie Candle M.D.   On: 09/24/2019 13:41    ____________________________________________    PROCEDURES  Procedure(s) performed:    Procedures    Medications  alum & mag hydroxide-simeth (MAALOX/MYLANTA) 200-200-20 MG/5ML suspension 30 mL (30 mLs Oral Given 09/24/19 1446)    And  lidocaine (XYLOCAINE) 2 % viscous mouth solution 15 mL (15 mLs Oral Given 09/24/19 1446)  LORazepam (ATIVAN) tablet 0.5 mg (0.5 mg Oral Given 09/24/19 1448)     ____________________________________________   INITIAL IMPRESSION / ASSESSMENT AND PLAN / ED COURSE  Pertinent labs & imaging results that were available during my care of the patient were reviewed by me and considered in my medical decision making (see chart for details).  Review of the [place] CSRS was performed in accordance of the [place] prior to dispensing any controlled drugs.  Clinical Course as of Sep 23 1525  Sat Sep 24, 2019  1432 Patient presented to the emergency department complaining of left-sided intermittent chest pain x2 days.  Symptoms began yesterday and have continued today.  Patient is now complaining of pain radiating to the left side of the neck with her symptoms.  Patient denies any cardiac history in herself or close family members.  Patient reports that she does have increased stress, anxiety given Covid pandemic, family situation, job situation.  Patient is also experiencing some reflux symptoms for the past week.  Overall exam is reassuring.  EKG is reassuring.  Labs at this time are reassuring pending second troponin.  Differential includes STEMI, NSTEMI, gastritis, GERD, panic attack/anxiety.   [JC]    Clinical Course User Index [JC] Kimbella Heisler, Charline Bills, PA-C          Patient's diagnosis is consistent with nonspecific chest pain, anxiety.  Patient presented to the emergency department with intermittent left-sided chest pain.   No cardiac history.  Exam, labs, EKG are reassuring.  Patient does have a history of anxiety and states that she has been under increased stress, at increased levels of anxiety.  Patient is also experiencing some GERD symptoms for the past week.  I suspect that anxiety and GERD are likely etiology of patient's symptoms.  Patient was given 0.5 mg Ativan and GI cocktail emergency department with some relief of symptoms..  No further work-up at this time.  Patient will be discharged home with prescriptions for omeprazole. Patient is to follow up with primary care as needed or otherwise directed. Patient is given ED precautions to return to the ED for any worsening or  new symptoms.     ____________________________________________  FINAL CLINICAL IMPRESSION(S) / ED DIAGNOSES  Final diagnoses:  Nonspecific chest pain  Anxiety      NEW MEDICATIONS STARTED DURING THIS VISIT:  ED Discharge Orders         Ordered    omeprazole (PRILOSEC) 10 MG capsule  Daily     09/24/19 1524              This chart was dictated using voice recognition software/Dragon. Despite best efforts to proofread, errors can occur which can change the meaning. Any change was purely unintentional.    Racheal Patches, PA-C 09/24/19 1526    Jene Every, MD 09/25/19 1624

## 2019-09-24 NOTE — ED Triage Notes (Signed)
Patient presents to the ED with squeezing/tightening pain to her left chest, radiating to her back, neck and shoulder.  Patient states pain has been intermittent since yesterday.  Patient denies cardiac history.  Patient has history of hypertension.

## 2019-09-24 NOTE — ED Notes (Signed)
EDP Siadecki confirms no new orders. Pt notified.

## 2019-09-26 ENCOUNTER — Ambulatory Visit (INDEPENDENT_AMBULATORY_CARE_PROVIDER_SITE_OTHER): Payer: Medicaid Other | Admitting: Internal Medicine

## 2019-09-26 ENCOUNTER — Encounter: Payer: Self-pay | Admitting: Internal Medicine

## 2019-09-26 ENCOUNTER — Other Ambulatory Visit: Payer: Self-pay

## 2019-09-26 ENCOUNTER — Telehealth: Payer: Self-pay | Admitting: Internal Medicine

## 2019-09-26 VITALS — BP 143/83 | HR 87 | Ht 60.0 in | Wt 160.0 lb

## 2019-09-26 DIAGNOSIS — G43009 Migraine without aura, not intractable, without status migrainosus: Secondary | ICD-10-CM | POA: Diagnosis not present

## 2019-09-26 DIAGNOSIS — R0789 Other chest pain: Secondary | ICD-10-CM | POA: Diagnosis not present

## 2019-09-26 DIAGNOSIS — F4001 Agoraphobia with panic disorder: Secondary | ICD-10-CM

## 2019-09-26 MED ORDER — LORAZEPAM 0.5 MG PO TABS: 0.5000 mg | ORAL_TABLET | Freq: Two times a day (BID) | ORAL | 1 refills | Status: DC | PRN

## 2019-09-26 MED ORDER — MELOXICAM 15 MG PO TABS: 15.0000 mg | ORAL_TABLET | Freq: Every day | ORAL | 0 refills | Status: DC

## 2019-09-26 NOTE — Telephone Encounter (Signed)
Patient scheduled today at 4 pm.

## 2019-09-26 NOTE — Progress Notes (Signed)
Date:  09/26/2019   Name:  Amy Gomez   DOB:  07/23/1981   MRN:  [id number]   Chief Complaint: Anxiety (Seen ED to be cleared to be sure she was not having a heart attack. EKG and troponin levels came back normal. Pt believes she is having a long anxiety attack. Afraid she is going to lose her job due to this. She said it started at work and she has not been able to go back to work since. GAD- 18, PHQ- 14. Started Thursday ont he way to work. Chest feels tight and left arm pain. Getting worse. Ativan helped some but not a lot. Still having tightness in chest on and off. Stabbing pain when strong. )  Chest Pain  This is a new problem. Episode onset: started over the weekend. The onset quality is sudden. The problem has been waxing and waning. The pain is present in the substernal region and lateral region. The pain is moderate. The quality of the pain is described as pressure. The pain radiates to the left shoulder. Associated symptoms include diaphoresis, headaches and vomiting (once last pm). Pertinent negatives include no abdominal pain, cough, dizziness, fever, nausea, palpitations or shortness of breath. The pain is aggravated by breathing, coughing and movement. She has tried nothing for the symptoms. Improvement on treatment: seen in ED - normal EKG and normal labs. Risk factors include oral contraceptive use.  Anxiety Presents for follow-up visit. Symptoms include chest pain, nervous/anxious behavior and panic. Patient reports no dizziness, nausea, palpitations, shortness of breath or suicidal ideas. Symptoms occur most days. The severity of symptoms is causing significant distress. The quality of sleep is fair.   Compliance with medications: taking Zoloft and Buspar; Ativan at ED helped somewhat.    Lab Results  Component Value Date   CREATININE 0.72 09/24/2019   BUN 13 09/24/2019   NA 140 09/24/2019   K 3.9 09/24/2019   CL 110 09/24/2019   CO2 22 09/24/2019   No results  found for: CHOL, HDL, LDLCALC, LDLDIRECT, TRIG, CHOLHDL Lab Results  Component Value Date   TSH 0.529 08/20/2018   Lab Results  Component Value Date   HGBA1C 5.8 (H) 01/26/2019     Review of Systems  Constitutional: Positive for diaphoresis. Negative for chills, fatigue, fever and unexpected weight change.  Respiratory: Positive for chest tightness. Negative for cough, shortness of breath and wheezing.   Cardiovascular: Positive for chest pain. Negative for palpitations and leg swelling.  Gastrointestinal: Positive for vomiting (once last pm). Negative for abdominal pain and nausea.  Neurological: Positive for headaches. Negative for dizziness and light-headedness.  Psychiatric/Behavioral: Negative for suicidal ideas. The patient is nervous/anxious.     Patient Active Problem List   Diagnosis Date Noted  . Panic disorder with agoraphobia 07/11/2019  . Pre-diabetes 01/26/2019  . Liver mass 01/25/2019  . Essential hypertension 07/15/2017  . Gastroesophageal reflux disease without esophagitis 12/18/2016  . History of kidney stones 06/04/2015  . Dysmenorrhea 06/04/2015  . Migraine without aura and without status migrainosus, not intractable 06/04/2015    Allergies  Allergen Reactions  . Sulfa Antibiotics Anaphylaxis, Swelling and Rash    Past Surgical History:  Procedure Laterality Date  . DILATION AND CURETTAGE OF UTERUS    . TUBAL LIGATION Bilateral 2007    Social History   Tobacco Use  . Smoking status: Current Every Day Smoker    Packs/day: 1.00    Types: Cigarettes  . Smokeless tobacco: Never Used  Substance Use Topics  . Alcohol use: No    Alcohol/week: 0.0 standard drinks  . Drug use: No     Medication list has been reviewed and updated.  Current Meds  Medication Sig  . busPIRone (BUSPAR) 10 MG tablet Take 1 tablet (10 mg total) by mouth 3 (three) times daily.  Marland Kitchen lisinopril (PRINIVIL,ZESTRIL) 20 MG tablet Take 1 tablet (20 mg total) by mouth daily.  .  metoprolol succinate (TOPROL-XL) 50 MG 24 hr tablet Take 50 mg by mouth daily. Take with or immediately following a meal.  . naratriptan (AMERGE) 2.5 MG tablet Take 1 tablet (2.5 mg total) by mouth as needed for migraine. Take one (1) tablet at onset of headache; if returns or does not resolve, may repeat after 4 hours; do not exceed five (5) mg in 24 hours.  . sertraline (ZOLOFT) 50 MG tablet Take 1 tablet (50 mg total) by mouth daily.  Marland Kitchen topiramate (TOPAMAX) 50 MG tablet TAKE 2 TABLETS BY MOUTH AT BEDTIME    PHQ 2/9 Scores 09/26/2019 08/23/2019 07/11/2019 05/03/2019  PHQ - 2 Score 0 0 6 3  PHQ- 9 Score 14 8 21 7     BP Readings from Last 3 Encounters:  09/26/19 (!) 143/83  09/24/19 100/62  08/23/19 124/62    Physical Exam Constitutional:      Appearance: Normal appearance. She is not ill-appearing or diaphoretic.  Neck:     Musculoskeletal: Normal range of motion.  Cardiovascular:     Rate and Rhythm: Normal rate and regular rhythm.     Pulses: Normal pulses.  Pulmonary:     Effort: Pulmonary effort is normal.     Breath sounds: Normal breath sounds. No wheezing or rhonchi.  Abdominal:     General: Abdomen is flat.     Palpations: Abdomen is soft.     Tenderness: There is abdominal tenderness (LUQ - mild).  Musculoskeletal: Normal range of motion.     Right lower leg: No edema.     Left lower leg: No edema.  Lymphadenopathy:     Cervical: No cervical adenopathy.  Skin:    General: Skin is warm.     Capillary Refill: Capillary refill takes less than 2 seconds.  Neurological:     General: No focal deficit present.     Mental Status: She is alert.  Psychiatric:        Attention and Perception: Attention and perception normal.        Mood and Affect: Mood normal.        Speech: Speech normal.        Behavior: Behavior normal.        Judgment: Judgment normal.     Wt Readings from Last 3 Encounters:  09/26/19 160 lb (72.6 kg)  09/24/19 158 lb (71.7 kg)  08/23/19 161  lb (73 kg)    BP (!) 143/83   Pulse 87   Ht 5' (1.524 m)   Wt 160 lb (72.6 kg)   LMP 09/17/2019 (Exact Date)   SpO2 98%   BMI 31.25 kg/m   Assessment and Plan: 1. Panic disorder with agoraphobia Continue Zoloft and Buspar Take Ativan twice a day PRN only - LORazepam (ATIVAN) 0.5 MG tablet; Take 1 tablet (0.5 mg total) by mouth 2 (two) times daily as needed for anxiety.  Dispense: 15 tablet; Refill: 1  2. Atypical chest pain Suspect MSK vs GERD - pt reassured regarding normal EKG and labs Fill Rx for omeprazole given by ED Add  Mobic  Follow up if no change - meloxicam (MOBIC) 15 MG tablet; Take 1 tablet (15 mg total) by mouth daily.  Dispense: 30 tablet; Refill: 0  3. Migraine without aura and without status migrainosus, not intractable Continue Topamax for prevention and Amerge PRN Had side effect to [employer]   Partially dictated using [employer]. Any errors are unintentional.  Bari Edward, MD [facility]  09/26/2019

## 2019-09-26 NOTE — Patient Instructions (Signed)
[facility] - mental health care  [facility] ([facility])  in [place]

## 2019-09-26 NOTE — Telephone Encounter (Signed)
Amy Gomez wants to come in to follow up from the ER this past saturday for chest epsiodes she said that they have given her ativan which is not helping and said that she felt as though she have been rushed out and didnt think that the doctor have given her the best option.  she feels very uncomfortable she said she feeling it in her arms and neck and back area.

## 2019-09-27 ENCOUNTER — Encounter: Payer: Self-pay | Admitting: Internal Medicine

## 2019-09-29 ENCOUNTER — Encounter: Payer: Self-pay | Admitting: Internal Medicine

## 2019-09-29 ENCOUNTER — Other Ambulatory Visit: Payer: Self-pay | Admitting: Internal Medicine

## 2019-09-29 DIAGNOSIS — F4001 Agoraphobia with panic disorder: Secondary | ICD-10-CM

## 2019-09-29 NOTE — Telephone Encounter (Signed)
Please advise pt request for Rf until her appt with Psychologist 10/10/2019.

## 2019-10-12 ENCOUNTER — Other Ambulatory Visit: Payer: Self-pay | Admitting: Internal Medicine

## 2019-10-12 DIAGNOSIS — G43009 Migraine without aura, not intractable, without status migrainosus: Secondary | ICD-10-CM

## 2019-10-12 MED ORDER — TRAMADOL HCL 50 MG PO TABS: 50.0000 mg | ORAL_TABLET | Freq: Three times a day (TID) | ORAL | 0 refills | Status: DC | PRN

## 2019-10-25 ENCOUNTER — Encounter: Payer: Self-pay | Admitting: Internal Medicine

## 2019-10-25 ENCOUNTER — Other Ambulatory Visit: Payer: Self-pay | Admitting: Internal Medicine

## 2019-10-25 DIAGNOSIS — I10 Essential (primary) hypertension: Secondary | ICD-10-CM

## 2019-10-25 MED ORDER — LISINOPRIL 20 MG PO TABS: 20.0000 mg | ORAL_TABLET | Freq: Every day | ORAL | 1 refills | Status: DC

## 2019-11-08 ENCOUNTER — Encounter: Payer: Self-pay | Admitting: Internal Medicine

## 2019-11-09 ENCOUNTER — Other Ambulatory Visit: Payer: Self-pay | Admitting: Internal Medicine

## 2019-11-09 DIAGNOSIS — G43009 Migraine without aura, not intractable, without status migrainosus: Secondary | ICD-10-CM

## 2019-11-09 MED ORDER — TOPIRAMATE 25 MG PO TABS: 75.0000 mg | ORAL_TABLET | Freq: Every day | ORAL | 2 refills | Status: DC

## 2019-11-28 ENCOUNTER — Encounter: Payer: Self-pay | Admitting: Internal Medicine

## 2019-11-28 NOTE — Telephone Encounter (Signed)
Please advise pt medication question?

## 2019-11-30 ENCOUNTER — Ambulatory Visit: Payer: Medicaid Other | Admitting: Internal Medicine

## 2019-12-06 ENCOUNTER — Ambulatory Visit: Payer: Medicaid Other

## 2019-12-06 ENCOUNTER — Ambulatory Visit
Admission: EM | Admit: 2019-12-06 | Discharge: 2019-12-06 | Disposition: A | Discharge status: Home / Self Care | Payer: Medicaid Other | Attending: Urgent Care | Admitting: Urgent Care

## 2019-12-06 ENCOUNTER — Other Ambulatory Visit: Payer: Self-pay

## 2019-12-06 DIAGNOSIS — W109XXA Fall (on) (from) unspecified stairs and steps, initial encounter: Secondary | ICD-10-CM | POA: Insufficient documentation

## 2019-12-06 DIAGNOSIS — Z9851 Tubal ligation status: Secondary | ICD-10-CM | POA: Diagnosis not present

## 2019-12-06 DIAGNOSIS — F17210 Nicotine dependence, cigarettes, uncomplicated: Secondary | ICD-10-CM | POA: Diagnosis not present

## 2019-12-06 DIAGNOSIS — F4001 Agoraphobia with panic disorder: Secondary | ICD-10-CM | POA: Diagnosis not present

## 2019-12-06 DIAGNOSIS — M79642 Pain in left hand: Secondary | ICD-10-CM | POA: Diagnosis present

## 2019-12-06 DIAGNOSIS — Z79899 Other long term (current) drug therapy: Secondary | ICD-10-CM | POA: Insufficient documentation

## 2019-12-06 DIAGNOSIS — M79641 Pain in right hand: Secondary | ICD-10-CM | POA: Diagnosis not present

## 2019-12-06 DIAGNOSIS — W010XXA Fall on same level from slipping, tripping and stumbling without subsequent striking against object, initial encounter: Secondary | ICD-10-CM | POA: Diagnosis not present

## 2019-12-06 DIAGNOSIS — Y9301 Activity, walking, marching and hiking: Secondary | ICD-10-CM | POA: Insufficient documentation

## 2019-12-06 DIAGNOSIS — R7303 Prediabetes: Secondary | ICD-10-CM | POA: Diagnosis not present

## 2019-12-06 DIAGNOSIS — I10 Essential (primary) hypertension: Secondary | ICD-10-CM | POA: Insufficient documentation

## 2019-12-06 DIAGNOSIS — S6392XA Sprain of unspecified part of left wrist and hand, initial encounter: Secondary | ICD-10-CM | POA: Diagnosis not present

## 2019-12-06 DIAGNOSIS — Z882 Allergy status to sulfonamides status: Secondary | ICD-10-CM | POA: Diagnosis not present

## 2019-12-06 MED ORDER — KETOROLAC TROMETHAMINE 10 MG PO TABS: 10.0000 mg | ORAL_TABLET | Freq: Three times a day (TID) | ORAL | 0 refills | Status: DC | PRN

## 2019-12-06 NOTE — ED Triage Notes (Signed)
Patient states that she fell this morning at her grandmothers. Patient states that she has bilteral hand pain and she has been unable to pick up anything since then. Patient states that pain radiates up her arms.

## 2019-12-06 NOTE — Discharge Instructions (Addendum)
It was very nice seeing you today in clinic. Thank you for entrusting me with your care.   REST, ICE, and ELEVATE hand/wrist. Take medication as prescribed. Wear splint.   Make arrangements to follow up with your regular doctor in 1 week for re-evaluation if not improving. If your symptoms/condition worsens, please seek follow up care either here or in the ER. Please remember, our [facility] providers are "right here with you" when you need [place].   Again, it was my pleasure to take care of you today. Thank you for choosing our clinic. I hope that you start to feel better quickly.   Quentin Mulling, MSN, APRN, FNP-C, CEN Advanced Practice Provider [facility]

## 2019-12-07 NOTE — ED Provider Notes (Signed)
[place], [place]   Name: Amy Gomez DOB: 03/09/1981 MRN: [id number] CSN: [id number] PCP: Reubin Milan, MD  Arrival date and time:  12/06/19 1449  Chief Complaint:  Fall and Hand Pain   NOTE: Prior to seeing the patient today, I have reviewed the triage nursing documentation and vital signs. Clinical staff has updated patient's PMH/PSHx, current medication list, and drug allergies/intolerances to ensure comprehensive history available to assist in medical decision making.   History:   HPI: Amy Gomez is a 39 y.o. female who presents today with complaints of BILATERAL hand pain following a mechanical fall earlier today. Patient reports that she was walking on her grandmother's porch and slid on the wet steps causing her to fall. There was no associated head trauma/injury. Patient reports that she caught herself with her RIGHT hand (FOOSH), however the majority of her pain in in the LEFT hand. Since the fall, patient reporting that she has had proximal radiation of the pain in her LEFT hand towards the elbow. She denies actual pain in her forearm and elbow. She notes difficulties picking up objects with her LEFT hand. She describes her RIGHT hand as being "sore". PMH (+) for previous fracture of the RIGHT wrist; did not require surgical intervention. Despite her symptoms, patient has not taken any over the counter interventions to help improve/relieve her reported symptoms at home.   Past Medical History:  Diagnosis Date  . Hypertension   . Kidney stones   . Leukocytosis 08/16/2018  . Migraine     Past Surgical History:  Procedure Laterality Date  . DILATION AND CURETTAGE OF UTERUS    . TUBAL LIGATION Bilateral 2007    Family History  Problem Relation Age of Onset  . Healthy Mother   . Healthy Father   . Endometriosis Maternal Grandmother     Social History   Tobacco Use  . Smoking status: Current Every Day Smoker    Packs/day: 1.00    Types: Cigarettes  .  Smokeless tobacco: Never Used  Substance Use Topics  . Alcohol use: No    Alcohol/week: 0.0 standard drinks  . Drug use: No    Patient Active Problem List   Diagnosis Date Noted  . Panic disorder with agoraphobia 07/11/2019  . Pre-diabetes 01/26/2019  . Liver mass 01/25/2019  . Essential hypertension 07/15/2017  . Gastroesophageal reflux disease without esophagitis 12/18/2016  . History of kidney stones 06/04/2015  . Dysmenorrhea 06/04/2015  . Migraine without aura and without status migrainosus, not intractable 06/04/2015    Home Medications:    Current Meds  Medication Sig  . lisinopril (ZESTRIL) 20 MG tablet Take 1 tablet (20 mg total) by mouth daily.  Marland Kitchen LORazepam (ATIVAN) 0.5 MG tablet Take 1 tablet (0.5 mg total) by mouth 2 (two) times daily as needed for anxiety.  . metoprolol succinate (TOPROL-XL) 50 MG 24 hr tablet Take 50 mg by mouth daily. Take with or immediately following a meal.  . naratriptan (AMERGE) 2.5 MG tablet Take 1 tablet (2.5 mg total) by mouth as needed for migraine. Take one (1) tablet at onset of headache; if returns or does not resolve, may repeat after 4 hours; do not exceed five (5) mg in 24 hours.  Marland Kitchen QUEtiapine (SEROQUEL) 50 MG tablet   . rOPINIRole (REQUIP) 0.25 MG tablet   . sertraline (ZOLOFT) 50 MG tablet Take 1 tablet (50 mg total) by mouth daily.  Marland Kitchen topiramate (TOPAMAX) 25 MG tablet Take 3 tablets (75 mg total)  by mouth at bedtime.    Allergies:   Sulfa antibiotics  Review of Systems (ROS): Review of Systems  Constitutional: Negative for chills and fever.  Respiratory: Negative for cough and shortness of breath.   Cardiovascular: Negative for chest pain and palpitations.  Musculoskeletal:       BILATERAL hand pain s/p fall.   Skin: Negative for color change, pallor, rash and wound.  Neurological: Positive for numbness (tingling in LEFT hand). Negative for dizziness and headaches.  All other systems reviewed and are negative.    Vital  Signs: Today's Vitals   12/06/19 1503 12/06/19 1504 12/06/19 1507 12/06/19 1639  BP:   105/76   Pulse:   85   Resp:   16   Temp:   98.2 F (36.8 C)   TempSrc:   Oral   SpO2:   98%   Height:  5' (1.524 m)    PainSc: 6    6     Physical Exam: Physical Exam  Constitutional: She is oriented to person, place, and time and well-developed, well-nourished, and in no distress.  HENT:  Head: Normocephalic and atraumatic.  Eyes: Pupils are equal, round, and reactive to light.  Cardiovascular: Normal rate and intact distal pulses.  Pulmonary/Chest: Effort normal. No respiratory distress.  Musculoskeletal:     Right hand: Swelling (mild) and tenderness present. No deformity. Normal range of motion. Normal strength. Normal sensation. There is no disruption of two-point discrimination. Normal capillary refill.     Left hand: Swelling (mild) and tenderness present. No deformity. Decreased range of motion. Normal strength. Normal sensation. There is no disruption of two-point discrimination. Normal capillary refill.  Neurological: She is alert and oriented to person, place, and time. Gait normal.  Skin: Skin is warm and dry. No rash noted. She is not diaphoretic.  Psychiatric: Memory, affect and judgment normal. Her mood appears anxious.  Nursing note and vitals reviewed.   Urgent Care Treatments / Results:   Orders Placed This Encounter  Procedures  . DG Hand Complete Left  . DG Hand Complete Right  . Thumb spica    LABS: PLEASE NOTE: all labs that were ordered this encounter are listed, however only abnormal results are displayed. Labs Reviewed - No data to display  EKG: -None  RADIOLOGY: No results found.  PROCEDURES: Procedures  MEDICATIONS RECEIVED THIS VISIT: Medications - No data to display  PERTINENT CLINICAL COURSE NOTES/UPDATES:   Initial Impression / Assessment and Plan / Urgent Care Course:  Pertinent labs & imaging results that were available during my care of  the patient were personally reviewed by me and considered in my medical decision making (see lab/imaging section of note for values and interpretations).  Amy Gomez is a 39 y.o. female who presents to [facility] today with complaints of Fall and Hand Pain  Patient is well appearing overall in clinic today. She does not appear to be in any acute distress. Presenting symptoms (see HPI) and exam as documented above. Exam reveals BILATERAL hand pain, with the L>R. No point tenderness noted over either scaphoid. Diagnostic radiographs of her BILATERAL hands revealed no acute abnormalities; no fractures or dislocation. Will treat as follows:   RIGHT hand: pain is much more mild as compared contralaterally. FROM noted on exam. Patient initially declined compression wrap citing that the pain was not that bad. She decided at time of discharge that she wound like to have hand wrapped for comfort and support. Nursing staff applied compression wrap  per request. RICE recommended.    LEFT hand: pain is much more significant in this hand. Swelling is more prominent. ROM is reduced. (+) PMS with normal temperature, color, and capillary refill. Pain with radial distribution. Will place patient in a cock-up/thumb spica velcro splint for stability and comfort. Patient to rest and ice hand/wrist. She was encouraged to wear splint until pain improves/resolves. Will send in Rx for supply of ketorolac for pain to use on a PRN basis.   If pain not improving in 1 week, patient encouraged to follow up with PCP for further evaluation, which may include referral to orthopedics. Patient verbalizes understanding. I have reviewed the follow up and strict return precautions for any new or worsening symptoms. Patient is aware of symptoms that would be deemed urgent/emergent, and would thus require further evaluation either here or in the emergency department. At the time of discharge, she verbalized understanding and  consent with the discharge plan as it was reviewed with her. All questions were fielded by provider and/or clinic staff prior to patient discharge.    Final Clinical Impressions / Urgent Care Diagnoses:   Final diagnoses:  Bilateral hand pain  Sprain, hand, left, initial encounter  Fall on stairs, initial encounter    New Prescriptions:  [place] Substance Registry consulted? Not Applicable  Meds ordered this encounter  Medications  . ketorolac (TORADOL) 10 MG tablet    Sig: Take 1 tablet (10 mg total) by mouth every 8 (eight) hours as needed.    Dispense:  15 tablet    Refill:  0    Recommended Follow up Care:  Patient encouraged to follow up with the following provider within the specified time frame, or sooner as dictated by the severity of her symptoms. As always, she was instructed that for any urgent/emergent care needs, she should seek care either here or in the emergency department for more immediate evaluation.  Follow-up Information    Reubin Milan, MD In 1 week.   Specialty: Internal Medicine Why: General reassessment of symptoms if not improving Contact information: [address] [place] [address] [phone]         NOTE: This note was prepared using Dragon dictation software along with smaller phrase technology. Despite my best ability to proofread, there is the potential that transcriptional errors may still occur from this process, and are completely unintentional.    Verlee Monte, NP 12/07/19 1946

## 2019-12-15 ENCOUNTER — Encounter: Payer: Self-pay | Admitting: Internal Medicine

## 2019-12-16 ENCOUNTER — Other Ambulatory Visit: Payer: Self-pay | Admitting: Internal Medicine

## 2019-12-16 DIAGNOSIS — G43009 Migraine without aura, not intractable, without status migrainosus: Secondary | ICD-10-CM

## 2019-12-16 MED ORDER — TRAMADOL HCL 50 MG PO TABS: 50.0000 mg | ORAL_TABLET | Freq: Three times a day (TID) | ORAL | 0 refills | Status: DC | PRN

## 2020-01-04 ENCOUNTER — Encounter: Payer: Self-pay | Admitting: Internal Medicine

## 2020-01-04 ENCOUNTER — Ambulatory Visit: Payer: Medicaid Other | Admitting: Internal Medicine

## 2020-01-09 ENCOUNTER — Encounter: Payer: Self-pay | Admitting: Internal Medicine

## 2020-01-09 ENCOUNTER — Other Ambulatory Visit: Payer: Self-pay

## 2020-01-09 ENCOUNTER — Ambulatory Visit (INDEPENDENT_AMBULATORY_CARE_PROVIDER_SITE_OTHER): Payer: Medicaid Other | Admitting: Internal Medicine

## 2020-01-09 VITALS — BP 130/76 | HR 107 | Temp 97.6°F | Ht 60.0 in | Wt 173.0 lb

## 2020-01-09 DIAGNOSIS — L719 Rosacea, unspecified: Secondary | ICD-10-CM | POA: Diagnosis not present

## 2020-01-09 DIAGNOSIS — I10 Essential (primary) hypertension: Secondary | ICD-10-CM

## 2020-01-09 DIAGNOSIS — B354 Tinea corporis: Secondary | ICD-10-CM | POA: Diagnosis not present

## 2020-01-09 DIAGNOSIS — G43009 Migraine without aura, not intractable, without status migrainosus: Secondary | ICD-10-CM

## 2020-01-09 MED ORDER — FLUCONAZOLE 100 MG PO TABS: 100.0000 mg | ORAL_TABLET | Freq: Every day | ORAL | 0 refills | Status: AC

## 2020-01-09 MED ORDER — METOPROLOL SUCCINATE ER 50 MG PO TB24: 50.0000 mg | ORAL_TABLET | Freq: Every day | ORAL | 3 refills | Status: DC

## 2020-01-09 NOTE — Progress Notes (Signed)
Date:  01/09/2020   Name:  Amy Gomez   DOB:  15-Jan-1981   MRN:  [id number]   Chief Complaint: Rash (Itchy. Started Thurs or Friday. Stopped all medication since then because she was concerned of allergic reaction. At night time it itches worse. When taking a shower if give the itching some relief. Back was in pain last night and burning on back and could not sleep. ) and Migraine (Only getting headaches now when having menstrual period. Naltriptan only works for a few hours. )  Rash This is a new problem. The current episode started in the past 7 days. The problem has been waxing and waning since onset. The rash is diffuse. The rash is characterized by blistering, redness and itchiness. Associated with: thought it was from Lexapro. Pertinent negatives include no congestion, diarrhea, facial edema, fatigue, fever, shortness of breath or sore throat. Past treatments include cold compress. The treatment provided mild relief.  Migraine  This is a recurrent problem. The problem occurs monthly. The pain does not radiate. The pain quality is similar to prior headaches. Pertinent negatives include no dizziness, fever or sore throat. She has tried triptans (Amerge helps for a only a few hours - did not know that she could take a second dose) for the symptoms. Her past medical history is significant for hypertension.  Hypertension This is a chronic problem. The problem is controlled. Associated symptoms include headaches. Pertinent negatives include no chest pain, palpitations or shortness of breath. Past treatments include beta blockers and ACE inhibitors. The current treatment provides significant improvement.    Lab Results  Component Value Date   CREATININE 0.72 09/24/2019   BUN 13 09/24/2019   NA 140 09/24/2019   K 3.9 09/24/2019   CL 110 09/24/2019   CO2 22 09/24/2019   No results found for: CHOL, HDL, LDLCALC, LDLDIRECT, TRIG, CHOLHDL Lab Results  Component Value Date   TSH 0.529  08/20/2018   Lab Results  Component Value Date   HGBA1C 5.8 (H) 01/26/2019     Review of Systems  Constitutional: Negative for chills, fatigue and fever.  HENT: Negative for congestion and sore throat.   Respiratory: Negative for chest tightness and shortness of breath.   Cardiovascular: Negative for chest pain, palpitations and leg swelling.  Gastrointestinal: Negative for diarrhea.  Genitourinary: Negative for dysuria.  Skin: Positive for rash.  Neurological: Positive for headaches. Negative for dizziness.  Psychiatric/Behavioral: Positive for dysphoric mood and sleep disturbance (improved with Seroquel).    Patient Active Problem List   Diagnosis Date Noted  . Panic disorder with agoraphobia 07/11/2019  . Pre-diabetes 01/26/2019  . Liver mass 01/25/2019  . Essential hypertension 07/15/2017  . Gastroesophageal reflux disease without esophagitis 12/18/2016  . History of kidney stones 06/04/2015  . Dysmenorrhea 06/04/2015  . Migraine without aura and without status migrainosus, not intractable 06/04/2015    Allergies  Allergen Reactions  . Sulfa Antibiotics Anaphylaxis, Swelling and Rash    Past Surgical History:  Procedure Laterality Date  . DILATION AND CURETTAGE OF UTERUS    . TUBAL LIGATION Bilateral 2007    Social History   Tobacco Use  . Smoking status: Current Every Day Smoker    Packs/day: 1.00    Types: Cigarettes  . Smokeless tobacco: Never Used  Substance Use Topics  . Alcohol use: No    Alcohol/week: 0.0 standard drinks  . Drug use: No     Medication list has been reviewed and updated.  Current  Meds  Medication Sig  . escitalopram (LEXAPRO) 10 MG tablet Take 10 mg by mouth daily.  Marland Kitchen lisinopril (ZESTRIL) 20 MG tablet Take 1 tablet (20 mg total) by mouth daily.  Marland Kitchen LORazepam (ATIVAN) 0.5 MG tablet Take 1 tablet (0.5 mg total) by mouth 2 (two) times daily as needed for anxiety.  . metoprolol succinate (TOPROL-XL) 50 MG 24 hr tablet Take 50 mg by  mouth daily. Take with or immediately following a meal.  . naratriptan (AMERGE) 2.5 MG tablet Take 1 tablet (2.5 mg total) by mouth as needed for migraine. Take one (1) tablet at onset of headache; if returns or does not resolve, may repeat after 4 hours; do not exceed five (5) mg in 24 hours.  Marland Kitchen QUEtiapine (SEROQUEL) 50 MG tablet   . rOPINIRole (REQUIP) 0.25 MG tablet   . topiramate (TOPAMAX) 25 MG tablet Take 3 tablets (75 mg total) by mouth at bedtime.  . traMADol (ULTRAM) 50 MG tablet Take 1 tablet (50 mg total) by mouth every 8 (eight) hours as needed.    PHQ 2/9 Scores 01/09/2020 09/26/2019 08/23/2019 07/11/2019  PHQ - 2 Score 3 0 0 6  PHQ- 9 Score 15 14 8 21     BP Readings from Last 3 Encounters:  01/09/20 130/76  12/06/19 105/76  09/26/19 (!) 143/83    Physical Exam Vitals and nursing note reviewed.  Constitutional:      General: She is not in acute distress.    Appearance: Normal appearance. She is well-developed.  HENT:     Head: Normocephalic and atraumatic.  Cardiovascular:     Rate and Rhythm: Normal rate and regular rhythm.     Pulses: Normal pulses.     Heart sounds: No murmur.  Pulmonary:     Effort: Pulmonary effort is normal. No respiratory distress.  Musculoskeletal:        General: Normal range of motion.     Cervical back: Normal range of motion.     Right lower leg: No edema.     Left lower leg: No edema.  Skin:    General: Skin is warm and dry.     Findings: No rash.     Comments: Few scattered red tiny macules on back and arms. Rash under breasts c/w tinea Mild focal erythema of the palms Rash on cheeks and chin c/w rosacea  Neurological:     Mental Status: She is alert and oriented to person, place, and time.  Psychiatric:        Behavior: Behavior normal.        Thought Content: Thought content normal.     Wt Readings from Last 3 Encounters:  01/09/20 173 lb (78.5 kg)  09/26/19 160 lb (72.6 kg)  09/24/19 158 lb (71.7 kg)    BP 130/76    Pulse (!) 107   Temp 97.6 F (36.4 C) (Temporal)   Ht 5' (1.524 m)   Wt 173 lb (78.5 kg)   LMP 12/15/2019   SpO2 99%   BMI 33.79 kg/m   Assessment and Plan: 1. Tinea corporis Likely cause of rash/itching Doubt Lexapro but recommend remaining off until Psych follow up - fluconazole (DIFLUCAN) 100 MG tablet; Take 1 tablet (100 mg total) by mouth daily for 7 days.  Dispense: 7 tablet; Refill: 0  2. Essential hypertension Clinically stable exam with well controlled BP on lisinopril and metoprolol. Tolerating medications without side effects at this time. Pt to continue current regimen and low sodium diet; benefits of  regular exercise as able discussed. - metoprolol succinate (TOPROL-XL) 50 MG 24 hr tablet; Take 1 tablet (50 mg total) by mouth daily. Take with or immediately following a meal.  Dispense: 90 tablet; Refill: 3  3. Migraine without aura and without status migrainosus, not intractable Now mainly monthly - pt advised that she can take a second Amerge after 4 hours if needed  4. Rosacea If not improving, may need to resume Cleocin topical.   Partially dictated using Animal nutritionist. Any errors are unintentional.  Bari Edward, MD [facility]  01/09/2020

## 2020-01-10 ENCOUNTER — Other Ambulatory Visit: Payer: Self-pay | Admitting: Internal Medicine

## 2020-01-10 ENCOUNTER — Telehealth: Payer: Self-pay

## 2020-01-10 ENCOUNTER — Encounter: Payer: Self-pay | Admitting: Internal Medicine

## 2020-01-10 DIAGNOSIS — R21 Rash and other nonspecific skin eruption: Secondary | ICD-10-CM

## 2020-01-10 MED ORDER — PREDNISONE 10 MG PO TABS: ORAL_TABLET | ORAL | 0 refills | Status: AC

## 2020-01-10 NOTE — Telephone Encounter (Signed)
A prednisone taper but I want her to continue the diflucan.

## 2020-01-10 NOTE — Telephone Encounter (Signed)
Patient called saying she took 1 capsule of the diflucan last night and her rash showed up on her hands with blisters and a rash going down one arm. She said she is miserable from itching all over. And wants to know what next steps are?  CM

## 2020-01-12 ENCOUNTER — Encounter: Payer: Self-pay | Admitting: Internal Medicine

## 2020-01-12 ENCOUNTER — Other Ambulatory Visit: Payer: Self-pay

## 2020-01-12 DIAGNOSIS — R21 Rash and other nonspecific skin eruption: Secondary | ICD-10-CM

## 2020-01-18 ENCOUNTER — Encounter: Payer: Self-pay | Admitting: Internal Medicine

## 2020-01-29 ENCOUNTER — Encounter: Payer: Self-pay | Admitting: Internal Medicine

## 2020-02-06 ENCOUNTER — Encounter: Payer: Self-pay | Admitting: Internal Medicine

## 2020-02-07 ENCOUNTER — Encounter: Payer: Self-pay | Admitting: Internal Medicine

## 2020-02-08 ENCOUNTER — Other Ambulatory Visit: Payer: Self-pay

## 2020-02-08 DIAGNOSIS — G43701 Chronic migraine without aura, not intractable, with status migrainosus: Secondary | ICD-10-CM

## 2020-02-08 DIAGNOSIS — G43009 Migraine without aura, not intractable, without status migrainosus: Secondary | ICD-10-CM

## 2020-02-14 ENCOUNTER — Other Ambulatory Visit
Admission: RE | Admit: 2020-02-14 | Discharge: 2020-02-14 | Disposition: A | Discharge status: Home / Self Care | Payer: Medicaid Other | Attending: Internal Medicine | Admitting: Internal Medicine

## 2020-02-14 ENCOUNTER — Encounter: Payer: Self-pay | Admitting: Internal Medicine

## 2020-02-14 ENCOUNTER — Other Ambulatory Visit: Payer: Self-pay

## 2020-02-14 ENCOUNTER — Ambulatory Visit: Payer: Medicaid Other | Admitting: Internal Medicine

## 2020-02-14 VITALS — BP 136/82 | HR 95 | Temp 98.7°F | Ht 60.0 in | Wt 178.0 lb

## 2020-02-14 DIAGNOSIS — R635 Abnormal weight gain: Secondary | ICD-10-CM

## 2020-02-14 DIAGNOSIS — F251 Schizoaffective disorder, depressive type: Secondary | ICD-10-CM | POA: Diagnosis not present

## 2020-02-14 DIAGNOSIS — Z6834 Body mass index (BMI) 34.0-34.9, adult: Secondary | ICD-10-CM | POA: Insufficient documentation

## 2020-02-14 DIAGNOSIS — R252 Cramp and spasm: Secondary | ICD-10-CM

## 2020-02-14 LAB — BASIC METABOLIC PANEL
Anion gap: 8 (ref 5–15)
BUN: 10 mg/dL (ref 6–20)
CO2: 25 mmol/L (ref 22–32)
Calcium: 8.9 mg/dL (ref 8.9–10.3)
Chloride: 104 mmol/L (ref 98–111)
Creatinine, Ser: 0.69 mg/dL (ref 0.44–1.00)
GFR calc Af Amer: >60 mL/min (ref >60)
GFR calc non Af Amer: >60 mL/min (ref >60)
Glucose, Bld: 83 mg/dL (ref 70–99)
Potassium: 4.3 mmol/L (ref 3.5–5.1)
Sodium: 137 mmol/L (ref 135–145)

## 2020-02-14 LAB — POCT URINALYSIS DIPSTICK
Bilirubin, UA: NEGATIVE
Glucose, UA: NEGATIVE
Ketones, UA: NEGATIVE
Leukocytes, UA: NEGATIVE
Nitrite, UA: NEGATIVE
Protein, UA: NEGATIVE
Spec Grav, UA: 1.015 (ref 1.010–1.025)
Urobilinogen, UA: 0.2 E.U./dL
pH, UA: 7 (ref 5.0–8.0)

## 2020-02-14 LAB — MAGNESIUM: Magnesium: 1.9 mg/dL (ref 1.7–2.4)

## 2020-02-14 LAB — T4, FREE: Free T4: 0.78 ng/dL (ref 0.61–1.12)

## 2020-02-14 LAB — TSH: TSH: 0.583 u[IU]/mL (ref 0.350–4.500)

## 2020-02-14 MED ORDER — METHOCARBAMOL 500 MG PO TABS: 500.0000 mg | ORAL_TABLET | Freq: Three times a day (TID) | ORAL | 0 refills | Status: DC

## 2020-02-14 NOTE — Progress Notes (Signed)
Date:  02/14/2020   Name:  Amy Gomez   DOB:  1981-08-20   MRN:  [id number]   Chief Complaint: Back Pain (x1 day yesterday morning "maybe kidney problems" pain across lower back, no problem urinating, if she sits a certain way it kind of relieves the pain, stabbing constant pain ) and Weight Gain (no appetite but still gaining weight can go 2 days without eating )  Back Pain This is a recurrent problem. The problem occurs constantly. The problem has been waxing and waning since onset. The quality of the pain is described as cramping and shooting. The pain does not radiate. Associated symptoms include dysuria. Pertinent negatives include no abdominal pain, chest pain or headaches. Risk factors: kidney stones.   Weight gain - has gained 18 lbs since November and 10 lbs since February.  She started taking Seroquel in November as well.  Schizoaffective disorder with depression - currently under the care of a Psychiatrist who is adjusting her medications.  She no longer hears voices but she is concerned about the weight gain and her family feeling like she is a stranger.  She does not feel like herself but is committed to improvement.  Lab Results  Component Value Date   CREATININE 0.72 09/24/2019   BUN 13 09/24/2019   NA 140 09/24/2019   K 3.9 09/24/2019   CL 110 09/24/2019   CO2 22 09/24/2019   No results found for: CHOL, HDL, LDLCALC, LDLDIRECT, TRIG, CHOLHDL Lab Results  Component Value Date   TSH 0.529 08/20/2018   Lab Results  Component Value Date   HGBA1C 5.8 (H) 01/26/2019   Lab Results  Component Value Date   WBC 11.6 (H) 09/24/2019   HGB 14.0 09/24/2019   HCT 40.3 09/24/2019   MCV 87.0 09/24/2019   PLT 300 09/24/2019   Lab Results  Component Value Date   ALT 18 01/26/2019   AST 22 01/26/2019   ALKPHOS 67 01/26/2019   BILITOT 0.6 01/26/2019     Review of Systems  Constitutional: Positive for unexpected weight change. Negative for appetite change,  diaphoresis and fatigue.  Respiratory: Negative for chest tightness, shortness of breath and wheezing.   Cardiovascular: Negative for chest pain, palpitations and leg swelling.  Gastrointestinal: Negative for abdominal pain, blood in stool, constipation and diarrhea.  Genitourinary: Positive for dysuria and flank pain. Negative for hematuria.  Musculoskeletal: Positive for back pain and myalgias.  Skin: Negative for color change and rash.  Neurological: Negative for dizziness, light-headedness and headaches.  Psychiatric/Behavioral: Positive for dysphoric mood. Negative for sleep disturbance. The patient is not nervous/anxious.     Patient Active Problem List   Diagnosis Date Noted  . Schizoaffective disorder, depressive type (HCC) 02/14/2020  . Rosacea 01/09/2020  . Panic disorder with agoraphobia 07/11/2019  . Pre-diabetes 01/26/2019  . Liver mass 01/25/2019  . Essential hypertension 07/15/2017  . Gastroesophageal reflux disease without esophagitis 12/18/2016  . History of kidney stones 06/04/2015  . Dysmenorrhea 06/04/2015  . Migraine without aura and without status migrainosus, not intractable 06/04/2015    Allergies  Allergen Reactions  . Sulfa Antibiotics Anaphylaxis, Swelling and Rash    Past Surgical History:  Procedure Laterality Date  . DILATION AND CURETTAGE OF UTERUS    . TUBAL LIGATION Bilateral 2007    Social History   Tobacco Use  . Smoking status: Current Every Day Smoker    Packs/day: 0.50    Years: 24.00    Pack years: 12.00  Types: Cigarettes    Start date: 79  . Smokeless tobacco: Never Used  Substance Use Topics  . Alcohol use: No    Alcohol/week: 0.0 standard drinks  . Drug use: No     Medication list has been reviewed and updated.  Current Meds  Medication Sig  . escitalopram (LEXAPRO) 10 MG tablet Take 10 mg by mouth daily.  Marland Kitchen lisinopril (ZESTRIL) 20 MG tablet Take 1 tablet (20 mg total) by mouth daily.  Marland Kitchen LORazepam (ATIVAN) 0.5  MG tablet Take 1 tablet (0.5 mg total) by mouth 2 (two) times daily as needed for anxiety.  . metoprolol succinate (TOPROL-XL) 50 MG 24 hr tablet Take 1 tablet (50 mg total) by mouth daily. Take with or immediately following a meal.  . naratriptan (AMERGE) 2.5 MG tablet Take 1 tablet (2.5 mg total) by mouth as needed for migraine. Take one (1) tablet at onset of headache; if returns or does not resolve, may repeat after 4 hours; do not exceed five (5) mg in 24 hours.  Marland Kitchen QUEtiapine (SEROQUEL) 50 MG tablet   . rOPINIRole (REQUIP) 0.25 MG tablet   . topiramate (TOPAMAX) 25 MG tablet Take 3 tablets (75 mg total) by mouth at bedtime.  . traMADol (ULTRAM) 50 MG tablet Take 1 tablet (50 mg total) by mouth every 8 (eight) hours as needed.    PHQ 2/9 Scores 02/14/2020 01/09/2020 09/26/2019 08/23/2019  PHQ - 2 Score 2 3 0 0  PHQ- 9 Score 16 15 14 8     BP Readings from Last 3 Encounters:  02/14/20 136/82  01/09/20 130/76  12/06/19 105/76    Physical Exam Vitals and nursing note reviewed.  Constitutional:      General: She is not in acute distress.    Appearance: Normal appearance. She is well-developed.  HENT:     Head: Normocephalic and atraumatic.  Neck:     Vascular: No carotid bruit.  Cardiovascular:     Rate and Rhythm: Normal rate and regular rhythm.     Pulses: Normal pulses.  Pulmonary:     Effort: Pulmonary effort is normal. No respiratory distress.     Breath sounds: No wheezing or rhonchi.  Musculoskeletal:     Cervical back: Normal range of motion. No tenderness.     Lumbar back: Spasms and tenderness present. Decreased range of motion.       Back:     Right lower leg: No edema.     Left lower leg: No edema.  Lymphadenopathy:     Cervical: No cervical adenopathy.  Skin:    General: Skin is warm and dry.     Findings: No rash.  Neurological:     Mental Status: She is alert and oriented to person, place, and time.  Psychiatric:        Mood and Affect: Mood is anxious and  depressed.        Speech: Speech normal.        Behavior: Behavior normal.     Wt Readings from Last 3 Encounters:  02/14/20 178 lb (80.7 kg)  01/09/20 173 lb (78.5 kg)  09/26/19 160 lb (72.6 kg)    BP 136/82   Pulse 95   Temp 98.7 F (37.1 C) (Oral)   Ht 5' (1.524 m)   Wt 178 lb (80.7 kg)   SpO2 96%   BMI 34.76 kg/m   Assessment and Plan: 1. Muscle cramps Due to strain in back and legs; Continue heat, Advil and add robaxin  RBC in urine due to menses - Basic metabolic panel - Magnesium - methocarbamol (ROBAXIN) 500 MG tablet; Take 1 tablet (500 mg total) by mouth 3 (three) times daily.  Dispense: 30 tablet; Refill: 0  2. Abnormal weight gain Most likely due to Seroquel therapy Will check thyroid labs She will discuss medication management with Psych at next visit - TSH + free T4  3. Schizoaffective disorder, depressive type ([place]) Now on Lexapro, Seroquel and PRN Ativan   Partially dictated using Editor, commissioning. Any errors are unintentional.  Halina Maidens, MD [facility]  02/14/2020

## 2020-02-27 ENCOUNTER — Encounter: Payer: Self-pay | Admitting: Internal Medicine

## 2020-02-28 ENCOUNTER — Encounter: Payer: Medicaid Other | Admitting: Internal Medicine

## 2020-03-12 ENCOUNTER — Other Ambulatory Visit: Payer: Self-pay | Admitting: Internal Medicine

## 2020-03-12 ENCOUNTER — Encounter: Payer: Self-pay | Admitting: Internal Medicine

## 2020-03-12 DIAGNOSIS — G43009 Migraine without aura, not intractable, without status migrainosus: Secondary | ICD-10-CM

## 2020-03-12 MED ORDER — TRAMADOL HCL 50 MG PO TABS: 50.0000 mg | ORAL_TABLET | Freq: Three times a day (TID) | ORAL | 0 refills | Status: DC | PRN

## 2020-03-12 MED ORDER — TOPIRAMATE 25 MG PO TABS: 75.0000 mg | ORAL_TABLET | Freq: Every day | ORAL | 2 refills | Status: DC

## 2020-03-15 ENCOUNTER — Encounter: Payer: Self-pay | Admitting: Internal Medicine

## 2020-03-23 ENCOUNTER — Encounter: Payer: Self-pay | Admitting: Internal Medicine

## 2020-04-12 ENCOUNTER — Encounter: Payer: Self-pay | Admitting: Internal Medicine

## 2020-05-03 ENCOUNTER — Encounter: Payer: Self-pay | Admitting: Internal Medicine

## 2020-05-03 ENCOUNTER — Ambulatory Visit: Payer: Medicaid Other | Attending: Internal Medicine

## 2020-05-03 DIAGNOSIS — Z20822 Contact with and (suspected) exposure to covid-19: Secondary | ICD-10-CM

## 2020-05-04 LAB — NOVEL CORONAVIRUS, NAA: SARS-CoV-2, NAA: NOT DETECTED

## 2020-05-04 LAB — SARS-COV-2, NAA 2 DAY TAT

## 2020-05-15 ENCOUNTER — Other Ambulatory Visit: Payer: Self-pay | Admitting: Internal Medicine

## 2020-05-15 DIAGNOSIS — G43009 Migraine without aura, not intractable, without status migrainosus: Secondary | ICD-10-CM

## 2020-05-15 MED ORDER — TRAMADOL HCL 50 MG PO TABS: 50.0000 mg | ORAL_TABLET | Freq: Three times a day (TID) | ORAL | 0 refills | Status: DC | PRN

## 2020-05-15 NOTE — Telephone Encounter (Signed)
Please Advise. Last office visit 02/14/2020.  KP

## 2020-05-15 NOTE — Telephone Encounter (Signed)
Called pt told her that her med RF was approved and it was sent to [facility] in [place]. Pt verbalized understanding.  KP

## 2020-05-30 ENCOUNTER — Encounter: Payer: Self-pay | Admitting: Internal Medicine

## 2020-05-30 ENCOUNTER — Ambulatory Visit: Payer: Medicaid Other | Admitting: Internal Medicine

## 2020-05-30 ENCOUNTER — Other Ambulatory Visit: Payer: Self-pay

## 2020-05-30 VITALS — BP 122/70 | HR 93 | Temp 99.0°F | Ht 60.0 in | Wt 177.0 lb

## 2020-05-30 DIAGNOSIS — R222 Localized swelling, mass and lump, trunk: Secondary | ICD-10-CM

## 2020-05-30 DIAGNOSIS — I10 Essential (primary) hypertension: Secondary | ICD-10-CM | POA: Diagnosis not present

## 2020-05-30 MED ORDER — LISINOPRIL 20 MG PO TABS: 20.0000 mg | ORAL_TABLET | Freq: Every day | ORAL | 1 refills | Status: DC

## 2020-05-30 NOTE — Patient Instructions (Signed)
[email or web address] for covid vaccine

## 2020-05-30 NOTE — Progress Notes (Signed)
Date:  05/30/2020   Name:  Amy Gomez   DOB:  [birth year]   MRN:  [id number]   Chief Complaint: knot in neck (Found a knot on neck X 3 weeks. Not painful. Grandma noticed her neck had a lump and told her to come in. )  Hypertension This is a chronic problem. The problem is controlled. Pertinent negatives include no chest pain, headaches, palpitations or shortness of breath. Past treatments include ACE inhibitors and beta blockers. The current treatment provides significant improvement. There are no compliance problems.    Mass - noted about a month ago in certain positions on the left side of the neck.  No pain or redness. No injury.  She does not think that it is enlarging.  Lab Results  Component Value Date   CREATININE 0.69 02/14/2020   BUN 10 02/14/2020   NA 137 02/14/2020   K 4.3 02/14/2020   CL 104 02/14/2020   CO2 25 02/14/2020   No results found for: CHOL, HDL, LDLCALC, LDLDIRECT, TRIG, CHOLHDL Lab Results  Component Value Date   TSH 0.583 02/14/2020   Lab Results  Component Value Date   HGBA1C 5.8 (H) 01/26/2019   Lab Results  Component Value Date   WBC 11.6 (H) 09/24/2019   HGB 14.0 09/24/2019   HCT 40.3 09/24/2019   MCV 87.0 09/24/2019   PLT 300 09/24/2019   Lab Results  Component Value Date   ALT 18 01/26/2019   AST 22 01/26/2019   ALKPHOS 67 01/26/2019   BILITOT 0.6 01/26/2019     Review of Systems  Constitutional: Positive for unexpected weight change. Negative for chills, fatigue and fever.  HENT: Negative for sore throat and trouble swallowing.   Respiratory: Negative for chest tightness and shortness of breath.   Cardiovascular: Negative for chest pain and palpitations.  Neurological: Negative for dizziness and headaches.    Patient Active Problem List   Diagnosis Date Noted  . Schizoaffective disorder, depressive type (HCC) 02/14/2020  . Rosacea 01/09/2020  . Panic disorder with agoraphobia 07/11/2019  . Pre-diabetes 01/26/2019    . Liver mass 01/25/2019  . Essential hypertension 07/15/2017  . Gastroesophageal reflux disease without esophagitis 12/18/2016  . History of kidney stones 06/04/2015  . Dysmenorrhea 06/04/2015  . Migraine without aura and without status migrainosus, not intractable 06/04/2015    Allergies  Allergen Reactions  . Sulfa Antibiotics Anaphylaxis, Swelling and Rash    Past Surgical History:  Procedure Laterality Date  . DILATION AND CURETTAGE OF UTERUS    . TUBAL LIGATION Bilateral 2007    Social History   Tobacco Use  . Smoking status: Current Every Day Smoker    Packs/day: 0.50    Years: 24.00    Pack years: 12.00    Types: Cigarettes    Start date: 69  . Smokeless tobacco: Never Used  Vaping Use  . Vaping Use: Never used  Substance Use Topics  . Alcohol use: No    Alcohol/week: 0.0 standard drinks  . Drug use: No     Medication list has been reviewed and updated.  Current Meds  Medication Sig  . lisinopril (ZESTRIL) 20 MG tablet Take 1 tablet (20 mg total) by mouth daily.  . metoprolol succinate (TOPROL-XL) 50 MG 24 hr tablet Take 1 tablet (50 mg total) by mouth daily. Take with or immediately following a meal.  . topiramate (TOPAMAX) 25 MG tablet Take 3 tablets (75 mg total) by mouth at bedtime.  . traMADol (  ULTRAM) 50 MG tablet Take 1 tablet (50 mg total) by mouth every 8 (eight) hours as needed (migraine HA).    PHQ 2/9 Scores 05/30/2020 02/14/2020 01/09/2020 09/26/2019  PHQ - 2 Score 0 2 3 0  PHQ- 9 Score 3 16 15 14     GAD 7 : Generalized Anxiety Score 05/30/2020 02/14/2020 01/09/2020 09/26/2019  Nervous, Anxious, on Edge 0 3 3 3   Control/stop worrying 0 3 3 3   Worry too much - different things 0 3 3 3   Trouble relaxing 0 3 2 3   Restless 0 3 1 1   Easily annoyed or irritable 0 2 3 2   Afraid - awful might happen 0 1 2 3   Total GAD 7 Score 0 18 17 18   Anxiety Difficulty Not difficult at all Extremely difficult Extremely difficult Extremely difficult    BP  Readings from Last 3 Encounters:  05/30/20 122/70  02/14/20 136/82  01/09/20 130/76    Physical Exam Vitals and nursing note reviewed.  Constitutional:      General: She is not in acute distress.    Appearance: She is well-developed.  HENT:     Head: Normocephalic and atraumatic.  Neck:     Vascular: No carotid bruit.      Comments: Mild supraclavicular fullness - no tenderness or discrete mass Cardiovascular:     Rate and Rhythm: Normal rate and regular rhythm.     Pulses: Normal pulses.  Pulmonary:     Effort: Pulmonary effort is normal. No respiratory distress.     Breath sounds: No wheezing or rhonchi.  Musculoskeletal:     Cervical back: Normal range of motion.  Lymphadenopathy:     Cervical: No cervical adenopathy.  Skin:    General: Skin is warm and dry.     Findings: No rash.  Neurological:     General: No focal deficit present.     Mental Status: She is alert and oriented to person, place, and time.  Psychiatric:        Attention and Perception: Attention normal.        Mood and Affect: Mood normal.        Behavior: Behavior normal.     Wt Readings from Last 3 Encounters:  05/30/20 177 lb (80.3 kg)  02/14/20 178 lb (80.7 kg)  01/09/20 173 lb (78.5 kg)    BP 122/70   Pulse 93   Temp 99 F (37.2 C) (Oral)   Ht 5' (1.524 m)   Wt 177 lb (80.3 kg)   LMP 05/15/2020 (Exact Date)   SpO2 96%   BMI 34.57 kg/m   Assessment and Plan: 1. Fullness of supraclavicular fossa Benign appearance - pt reassured  2. Essential hypertension Clinically stable exam with well controlled BP on metoprolol and ace inhibitor. Tolerating medications without side effects at this time. Pt to continue current regimen and low sodium diet; benefits of regular exercise as able discussed. Pt encouraged to cut back on caffeine. - lisinopril (ZESTRIL) 20 MG tablet; Take 1 tablet (20 mg total) by mouth daily.  Dispense: 90 tablet; Refill: 1   Partially dictated using . Any errors are unintentional.  , MD [facility]  05/30/2020

## 2020-06-18 ENCOUNTER — Other Ambulatory Visit: Payer: Self-pay | Admitting: Internal Medicine

## 2020-06-18 ENCOUNTER — Ambulatory Visit: Payer: Self-pay | Admitting: Internal Medicine

## 2020-06-18 DIAGNOSIS — G43009 Migraine without aura, not intractable, without status migrainosus: Secondary | ICD-10-CM

## 2020-06-18 MED ORDER — TRAMADOL HCL 50 MG PO TABS: 50.0000 mg | ORAL_TABLET | Freq: Three times a day (TID) | ORAL | 0 refills | Status: DC | PRN

## 2020-06-18 MED ORDER — NARATRIPTAN HCL 2.5 MG PO TABS: 2.5000 mg | ORAL_TABLET | ORAL | 0 refills | Status: DC | PRN

## 2020-06-18 NOTE — Telephone Encounter (Signed)
Pt calling initially for advise regarding 2nd covid vaccine. States "About 2 hours after first vaccine developed migraine and diarrhea."  Reports h/o migraines. States since vaccine migraines occur daily, last 3-4 hours, 8/10. States tylenol no longer effective, Tramadol "Helps a little." States has had 2-3 loose stools a day since vaccine; reports is staying hydrated. Denies any pain, no associated symptoms. Pt states she is due for 2nd vaccine this Thursday, and "Will probably get it"  but asking Dr. Judithann Graves to call in Naratriptan she was on before. Also asking for "A few tramadol to take post vaccine." Reports she is not taking her Topamax "Rash." Advised may need appt, pt states "I don't think I need to be seen, just want back up for this vaccine. Pt states she thinks vaccine triggered migraines.  Please advise: [phone]   Reason for Disposition . [1] MILD-MODERATE headache AND [2] present > 72 hours    Post covid vaccine 2 weeks ago. H/O migraines. Pt thinks vaccine triggered migraines. CAlling for advise initially.  Answer Assessment - Initial Assessment Questions 1. LOCATION: "Where does it hurt?"      H/O migraines 2. ONSET: "When did the headache start?" (Minutes, hours or days)      2 hours after first covid vaccine 2 weeks ago 3. PATTERN: "Does the pain come and go, or has it been constant since it started?"     Intermittent 4. SEVERITY: "How bad is the pain?" and "What does it keep you from doing?"  (e.g., Scale 1-10; mild, moderate, or severe)   - MILD (1-3): doesn't interfere with normal activities    - MODERATE (4-7): interferes with normal activities or awakens from sleep    - SEVERE (8-10): excruciating pain, unable to do any normal activities        8/10, occur daily, last 2 hours 5. RECURRENT SYMPTOM: "Have you ever had headaches before?" If Yes, ask: "When was the last time?" and "What happened that time?"       6. CAUSE: "What do you think is causing the headache?"      Migraine triggered by vaccine 7. MIGRAINE: "Have you been diagnosed with migraine headaches?" If Yes, ask: "Is this headache similar?"      yes 8. HEAD INJURY: "Has there been any recent injury to the head?"      *no\ 9. OTHER SYMPTOMS: "Do you have any other symptoms?" (fever, stiff neck, eye pain, sore throat, cold symptoms) Diarrhea (Loose stools)  Protocols used: HEADACHE-A-AH

## 2020-06-18 NOTE — Telephone Encounter (Signed)
Patient requesting nortriptyline and traMADol (ULTRAM) 50 MG tablet for ongoing head aches since she received the 1st COVID shot 2 weeks ago, please advise   [id number] - [place], [place] - [address] Phone:  [phone]  Fax:  [phone]

## 2020-06-18 NOTE — Telephone Encounter (Signed)
Let her know that I sent those in.

## 2020-06-18 NOTE — Telephone Encounter (Signed)
Requested medication (s) are due for refill today: no  Requested medication (s) are on the active medication list: yes  Last refill: 7/6/20201  Future visit scheduled: no  Notes to clinic:  Patient would also like nortriptyline for ongoing headache that she has had since the 1st covid shot   Requested Prescriptions  Pending Prescriptions Disp Refills   traMADol (ULTRAM) 50 MG tablet 20 tablet 0    Sig: Take 1 tablet (50 mg total) by mouth every 8 (eight) hours as needed (migraine HA).      Not Delegated - Analgesics:  Opioid Agonists Failed - 06/18/2020 11:29 AM      Failed - This refill cannot be delegated      Failed - Urine Drug Screen completed in last 360 days.      Passed - Valid encounter within last 6 months    Recent Outpatient Visits           2 weeks ago Fullness of supraclavicular fossa   [facility] Reubin Milan, MD   4 months ago Muscle cramps   [facility] Reubin Milan, MD   5 months ago Tinea corporis   [facility] Reubin Milan, MD   8 months ago Panic disorder with agoraphobia   [facility] Reubin Milan, MD   10 months ago Panic disorder with agoraphobia   [facility] Reubin Milan, MD

## 2020-06-18 NOTE — Telephone Encounter (Signed)
Called pt let her know that her medications was sent in. Pt also said after her first Covid vaccine that she didn't feel well asked it she should get the second shot. Told pt that having side effects are normal that she still should get the second part of the vaccine. Pt verbalized understanding.  KP

## 2020-06-19 ENCOUNTER — Encounter: Payer: Self-pay | Admitting: Internal Medicine

## 2020-06-25 ENCOUNTER — Encounter: Payer: Self-pay | Admitting: Internal Medicine

## 2020-06-25 ENCOUNTER — Other Ambulatory Visit: Payer: Self-pay | Admitting: Internal Medicine

## 2020-06-25 DIAGNOSIS — R21 Rash and other nonspecific skin eruption: Secondary | ICD-10-CM

## 2020-07-04 ENCOUNTER — Encounter: Payer: Self-pay | Admitting: Internal Medicine

## 2020-07-06 ENCOUNTER — Ambulatory Visit: Payer: Medicaid Other | Admitting: Internal Medicine

## 2020-07-06 ENCOUNTER — Encounter: Payer: Self-pay | Admitting: Internal Medicine

## 2020-07-06 ENCOUNTER — Other Ambulatory Visit: Payer: Self-pay

## 2020-07-06 VITALS — BP 114/82 | HR 95 | Temp 98.5°F | Ht 60.0 in | Wt 177.0 lb

## 2020-07-06 DIAGNOSIS — N6011 Diffuse cystic mastopathy of right breast: Secondary | ICD-10-CM

## 2020-07-06 NOTE — Progress Notes (Signed)
Date:  07/06/2020   Name:  Amy Gomez   DOB:  [birth year]   MRN:  [id number]   Chief Complaint: breast problems (X2 weeks, right breast, felt like she was lactating,swollen, lump in chest hard and painful, burning sensation in breast, nipple dry and raw and itching ,right breast is bigger than the left, said she was prone breast cancer runs in the family)  HPI  Lab Results  Component Value Date   CREATININE 0.69 02/14/2020   BUN 10 02/14/2020   NA 137 02/14/2020   K 4.3 02/14/2020   CL 104 02/14/2020   CO2 25 02/14/2020   No results found for: CHOL, HDL, LDLCALC, LDLDIRECT, TRIG, CHOLHDL Lab Results  Component Value Date   TSH 0.583 02/14/2020   Lab Results  Component Value Date   HGBA1C 5.8 (H) 01/26/2019   Lab Results  Component Value Date   WBC 11.6 (H) 09/24/2019   HGB 14.0 09/24/2019   HCT 40.3 09/24/2019   MCV 87.0 09/24/2019   PLT 300 09/24/2019   Lab Results  Component Value Date   ALT 18 01/26/2019   AST 22 01/26/2019   ALKPHOS 67 01/26/2019   BILITOT 0.6 01/26/2019     Review of Systems  Constitutional: Negative for chills, fatigue and fever.  Respiratory: Negative for chest tightness and shortness of breath.   Cardiovascular: Negative for chest pain and palpitations.  Genitourinary: Positive for menstrual problem (having menses about every two weeks).       Breast fullness on right, possible lump, burning pain in the same area    Patient Active Problem List   Diagnosis Date Noted  . Schizoaffective disorder, depressive type (HCC) 02/14/2020  . Rosacea 01/09/2020  . Panic disorder with agoraphobia 07/11/2019  . Pre-diabetes 01/26/2019  . Liver mass 01/25/2019  . Essential hypertension 07/15/2017  . Gastroesophageal reflux disease without esophagitis 12/18/2016  . History of kidney stones 06/04/2015  . Dysmenorrhea 06/04/2015  . Migraine without aura and without status migrainosus, not intractable 06/04/2015    Allergies  Allergen  Reactions  . Sulfa Antibiotics Anaphylaxis, Swelling and Rash    Past Surgical History:  Procedure Laterality Date  . DILATION AND CURETTAGE OF UTERUS    . TUBAL LIGATION Bilateral 2007    Social History   Tobacco Use  . Smoking status: Current Every Day Smoker    Packs/day: 0.50    Years: 24.00    Pack years: 12.00    Types: Cigarettes    Start date: 85  . Smokeless tobacco: Never Used  Vaping Use  . Vaping Use: Never used  Substance Use Topics  . Alcohol use: No    Alcohol/week: 0.0 standard drinks  . Drug use: No     Medication list has been reviewed and updated.  Current Meds  Medication Sig  . diphenhydrAMINE HCl (BENADRYL PO) Take by mouth as needed.  Marland Kitchen lisinopril (ZESTRIL) 20 MG tablet Take 1 tablet (20 mg total) by mouth daily.  . metoprolol succinate (TOPROL-XL) 50 MG 24 hr tablet Take 1 tablet (50 mg total) by mouth daily. Take with or immediately following a meal.  . traMADol (ULTRAM) 50 MG tablet Take 1 tablet (50 mg total) by mouth every 8 (eight) hours as needed (migraine HA).    PHQ 2/9 Scores 05/30/2020 02/14/2020 01/09/2020 09/26/2019  PHQ - 2 Score 0 2 3 0  PHQ- 9 Score 3 16 15 14     GAD 7 : Generalized Anxiety Score 05/30/2020 02/14/2020  01/09/2020 09/26/2019  Nervous, Anxious, on Edge 0 3 3 3   Control/stop worrying 0 3 3 3   Worry too much - different things 0 3 3 3   Trouble relaxing 0 3 2 3   Restless 0 3 1 1   Easily annoyed or irritable 0 2 3 2   Afraid - awful might happen 0 1 2 3   Total GAD 7 Score 0 18 17 18   Anxiety Difficulty Not difficult at all Extremely difficult Extremely difficult Extremely difficult    BP Readings from Last 3 Encounters:  07/06/20 114/82  05/30/20 122/70  02/14/20 136/82    Physical Exam Vitals and nursing note reviewed.  Constitutional:      General: She is not in acute distress.    Appearance: She is well-developed.  HENT:     Head: Normocephalic and atraumatic.  Cardiovascular:     Rate and Rhythm: Normal  rate and regular rhythm.     Pulses: Normal pulses.  Pulmonary:     Effort: Pulmonary effort is normal. No respiratory distress.     Breath sounds: No wheezing or rhonchi.  Chest:     Breasts:        Right: Mass (fullness in the upper breast from 10 - 2 oclock) and tenderness present. No nipple discharge or skin change.        Left: Normal. No mass, nipple discharge, skin change or tenderness.  Musculoskeletal:        General: Normal range of motion.     Cervical back: Normal range of motion.  Lymphadenopathy:     Cervical: No cervical adenopathy.  Skin:    General: Skin is warm and dry.     Findings: No rash.  Neurological:     Mental Status: She is alert and oriented to person, place, and time.  Psychiatric:        Behavior: Behavior normal.        Thought Content: Thought content normal.     Wt Readings from Last 3 Encounters:  07/06/20 177 lb (80.3 kg)  05/30/20 177 lb (80.3 kg)  02/14/20 178 lb (80.7 kg)    BP 114/82   Pulse (!) 119   Temp 98.5 F (36.9 C) (Oral)   Ht 5' (1.524 m)   Wt 177 lb (80.3 kg)   LMP 07/02/2020   SpO2 96%   BMI 34.57 kg/m   Assessment and Plan: 1. Fibrocystic breast changes, right Suspect the breast changes are benign Would recommend waiting until after her next cycle then evaluate if persistent Continue to limit caffeine Add Vitamin E daily   Partially dictated using . Any errors are unintentional.  07/08/20, MD [facility]  07/06/2020

## 2020-07-06 NOTE — Patient Instructions (Signed)
Limit caffeine daily  Start a Vitamin E supplement

## 2020-07-09 ENCOUNTER — Encounter: Payer: Self-pay | Admitting: Internal Medicine

## 2020-07-13 ENCOUNTER — Encounter: Payer: Self-pay | Admitting: Internal Medicine

## 2020-08-02 ENCOUNTER — Encounter: Payer: Self-pay | Admitting: Internal Medicine

## 2020-08-02 NOTE — Telephone Encounter (Signed)
Please Advise.  KP

## 2020-08-03 ENCOUNTER — Encounter: Payer: Self-pay | Admitting: Internal Medicine

## 2020-08-03 ENCOUNTER — Other Ambulatory Visit: Payer: Self-pay | Admitting: Internal Medicine

## 2020-08-03 DIAGNOSIS — G43009 Migraine without aura, not intractable, without status migrainosus: Secondary | ICD-10-CM

## 2020-08-03 DIAGNOSIS — N200 Calculus of kidney: Secondary | ICD-10-CM

## 2020-08-03 MED ORDER — TAMSULOSIN HCL 0.4 MG PO CAPS: 0.4000 mg | ORAL_CAPSULE | Freq: Every day | ORAL | 0 refills | Status: DC

## 2020-08-03 MED ORDER — TRAMADOL HCL 50 MG PO TABS: 50.0000 mg | ORAL_TABLET | Freq: Three times a day (TID) | ORAL | 0 refills | Status: DC | PRN

## 2020-08-06 ENCOUNTER — Encounter: Payer: Self-pay | Admitting: Internal Medicine

## 2020-08-13 ENCOUNTER — Other Ambulatory Visit: Payer: Self-pay

## 2020-08-13 ENCOUNTER — Encounter: Payer: Self-pay | Admitting: Internal Medicine

## 2020-08-13 DIAGNOSIS — R635 Abnormal weight gain: Secondary | ICD-10-CM

## 2020-08-30 IMAGING — CR DG FOOT COMPLETE 3+V*L*
3 series · 3 of 3 positions shown · non-contrast
Comparison: None.

CLINICAL DATA: Pain of the MTP joint of the great toe and of the
arch over the last week. Injury 3 weeks ago.

EXAM:
LEFT FOOT - COMPLETE 3+ VIEW

[foot ap]
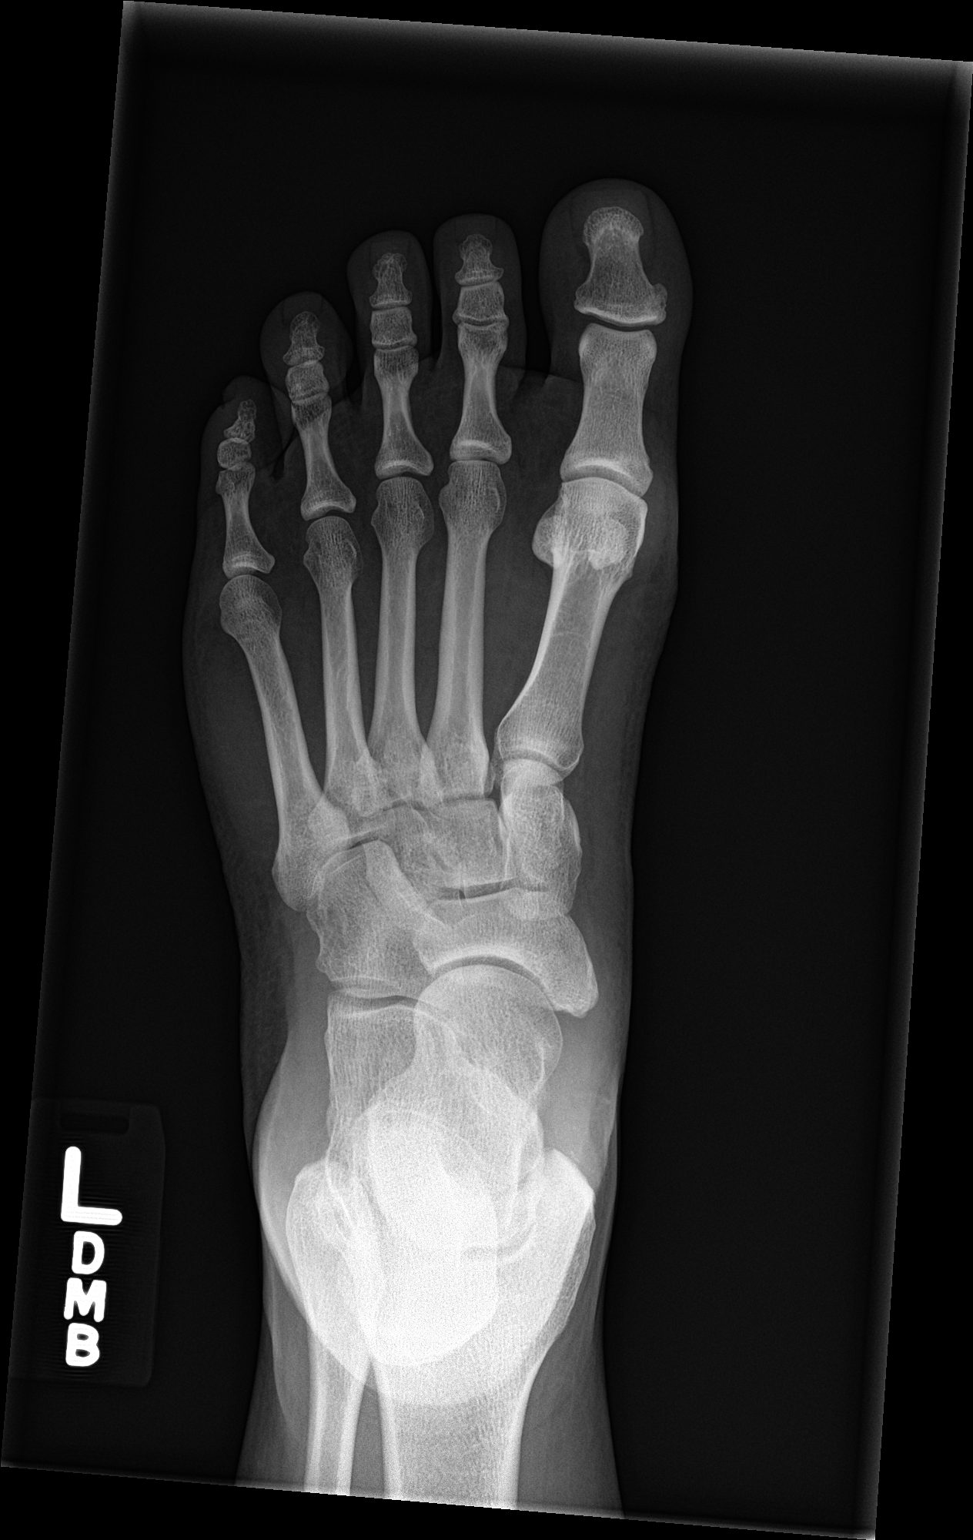

[foot obl]
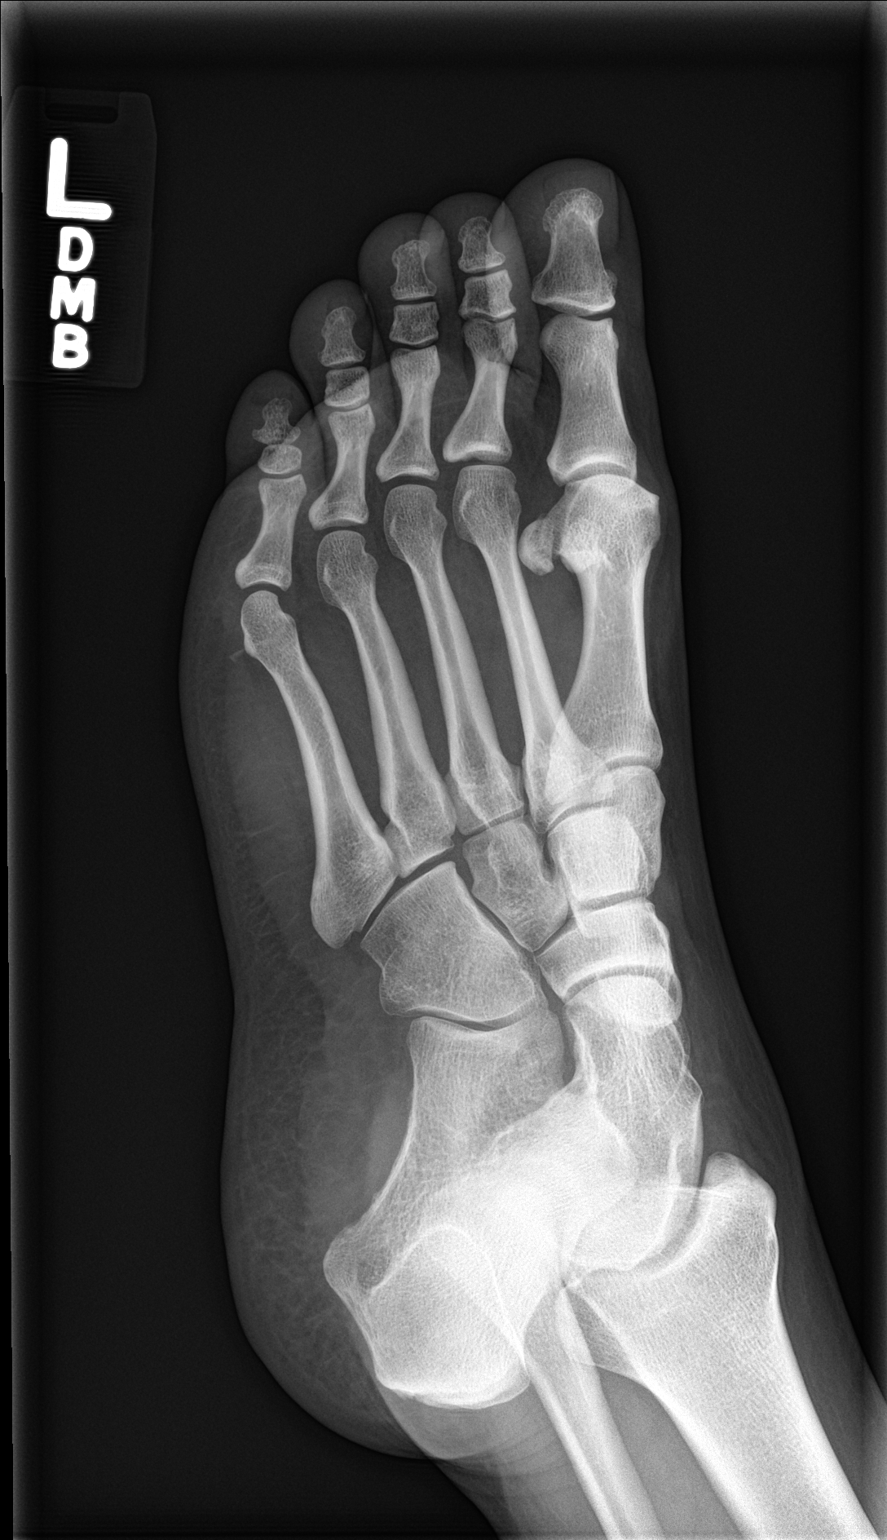

[foot lat]
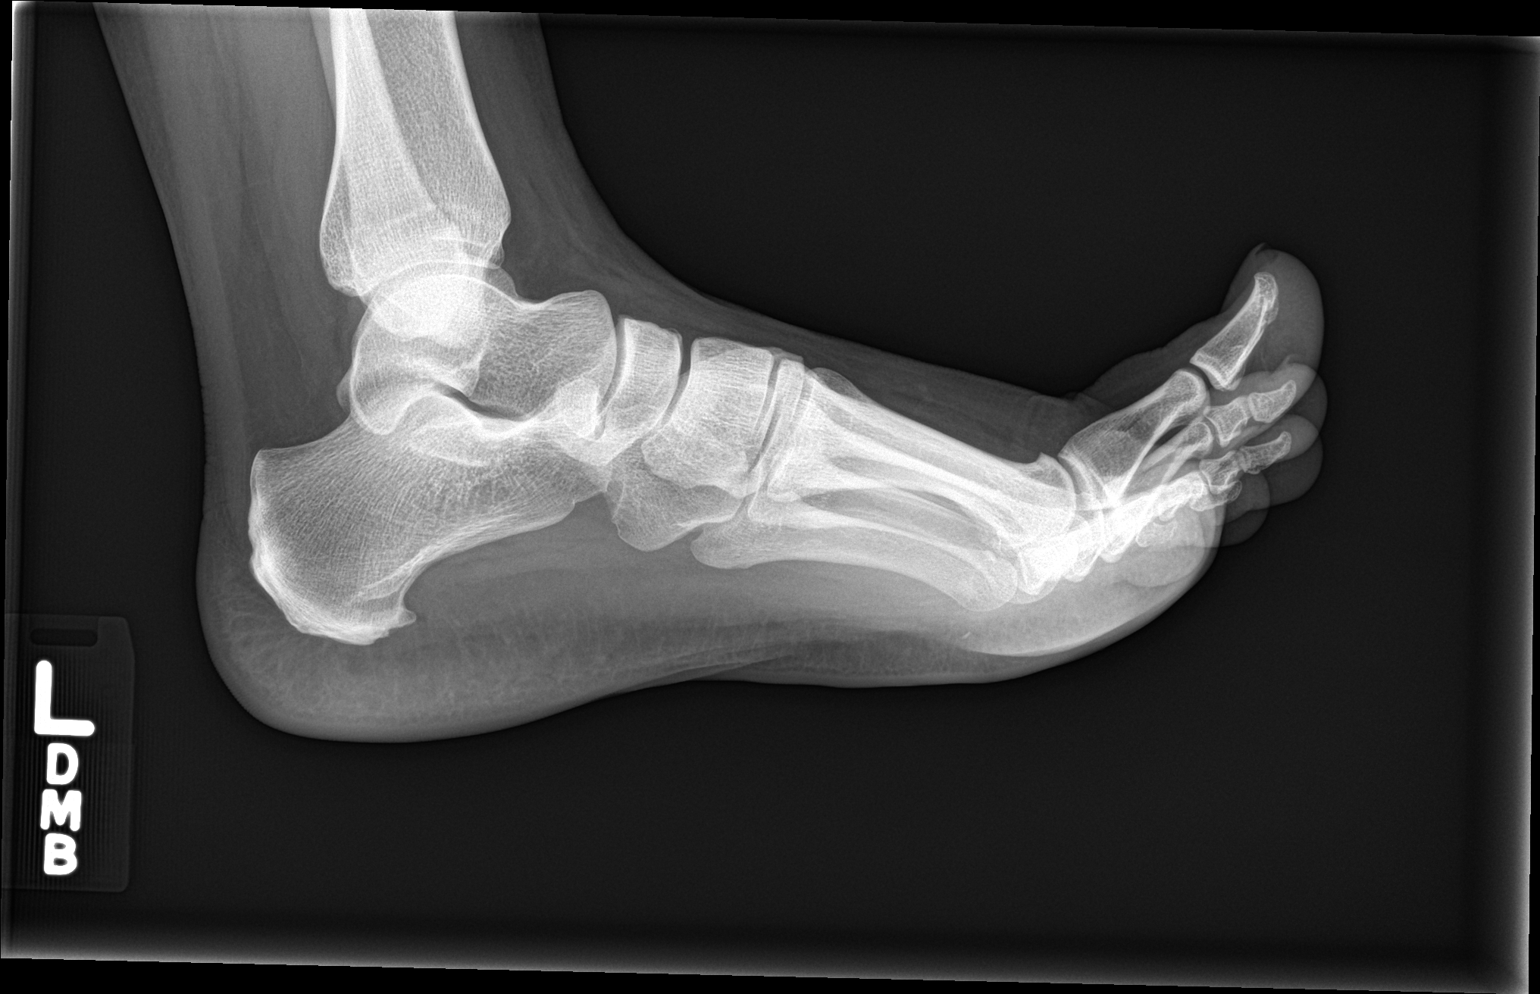

[3 of 3 positions shown; findings below may reference images not displayed]

FINDINGS: No osseous or articular abnormality seen. There does appear to be
mild soft tissue swelling in the region of the MTP joint of the
great toe, nonspecific.
IMPRESSION: No osseous or articular finding. Mild soft tissue swelling in the
region of the MTP joint of the great toe, nonspecific.

## 2020-09-03 ENCOUNTER — Other Ambulatory Visit: Payer: Self-pay

## 2020-09-03 ENCOUNTER — Encounter: Payer: Self-pay | Admitting: Emergency Medicine

## 2020-09-03 ENCOUNTER — Ambulatory Visit
Admission: EM | Admit: 2020-09-03 | Discharge: 2020-09-03 | Disposition: A | Discharge status: Home / Self Care | Payer: Medicaid Other

## 2020-09-03 DIAGNOSIS — L02416 Cutaneous abscess of left lower limb: Secondary | ICD-10-CM | POA: Diagnosis not present

## 2020-09-03 DIAGNOSIS — G43909 Migraine, unspecified, not intractable, without status migrainosus: Secondary | ICD-10-CM

## 2020-09-03 MED ORDER — HYDROCODONE-ACETAMINOPHEN 5-325 MG PO TABS: 2.0000 | ORAL_TABLET | ORAL | 0 refills | Status: DC | PRN

## 2020-09-03 MED ORDER — DOXYCYCLINE HYCLATE 100 MG PO CAPS: 100.0000 mg | ORAL_CAPSULE | Freq: Two times a day (BID) | ORAL | 0 refills | Status: DC

## 2020-09-03 NOTE — ED Triage Notes (Addendum)
Patient in today c/o a cyst on her left thigh off & on x 1 month. Patient has not used or taken any OTC medications. Patient also c/o migraine x 2 days off & on. Patient has not been taking her migraine medications.  Patient states she is not taking any medications other than her HTN medications due to continuing care by dermatology for a rash.

## 2020-09-03 NOTE — Discharge Instructions (Addendum)
Take the doxycycline twice daily for 10 days.  Take that with food.  Avoid the sun as it will make you more prone to sunburn.  Continually apply warm compresses to the abscess on your inner left thigh until it ruptures.  The abscess has a head on it so it should not take long for his rupture.  Use the Norco as needed for severe pain.  Do not take it with your tramadol if you still have some.  Use your Amerge for your headache.  Follow-up with your neurologist.

## 2020-09-03 NOTE — ED Provider Notes (Signed)
[facility]    CSN: [id number] Arrival date & time: 09/03/20  1309      History   Chief Complaint Chief Complaint  Patient presents with  . Cyst    left thigh  . Migraine    HPI Amy Gomez is a 39 y.o. female.   39 year old female here for evaluation of a cyst on her inner left thigh that she has had for a month.  Patient is also reporting that she has had a migraine for the past 2 days.  She is currently not taking any of her medications except for her blood pressure medication on the advice of dermatology because of a weird series of rashes that they are trying to determine the origin of.  Patient had been on Topamax and Amerge for her migraines.  Also rotating and tramadol.  The cyst is on her in her left thigh and has come and go for the past month.  Patient reports that it started off as an ingrown hair.     Past Medical History:  Diagnosis Date  . Hypertension   . Kidney stones   . Leukocytosis 08/16/2018  . Migraine     Patient Active Problem List   Diagnosis Date Noted  . Schizoaffective disorder, depressive type (HCC) 02/14/2020  . Rosacea 01/09/2020  . Panic disorder with agoraphobia 07/11/2019  . Pre-diabetes 01/26/2019  . Liver mass 01/25/2019  . Essential hypertension 07/15/2017  . Gastroesophageal reflux disease without esophagitis 12/18/2016  . History of kidney stones 06/04/2015  . Dysmenorrhea 06/04/2015  . Migraine without aura and without status migrainosus, not intractable 06/04/2015    Past Surgical History:  Procedure Laterality Date  . DILATION AND CURETTAGE OF UTERUS    . TUBAL LIGATION Bilateral 2007    OB History   No obstetric history on file.      Home Medications    Prior to Admission medications   Medication Sig Start Date End Date Taking? Authorizing Provider  cetirizine (ZYRTEC) 10 MG tablet Take 20 mg by mouth daily. 04/19/20  Yes [provider]  lisinopril (ZESTRIL) 20 MG tablet Take 1  tablet (20 mg total) by mouth daily. 05/30/20  Yes Reubin MilanBerglund, Laura H, MD  metoprolol succinate (TOPROL-XL) 50 MG 24 hr tablet Take 1 tablet (50 mg total) by mouth daily. Take with or immediately following a meal. 01/09/20  Yes Reubin MilanBerglund, Laura H, MD  doxycycline (VIBRAMYCIN) 100 MG capsule Take 1 capsule (100 mg total) by mouth 2 (two) times daily. 09/03/20   Becky Augustayan, Izan Miron, NP  HYDROcodone-acetaminophen (NORCO/VICODIN) 5-325 MG tablet Take 2 tablets by mouth every 4 (four) hours as needed. 09/03/20   Becky Augustayan, Jad Johansson, NP  naratriptan (AMERGE) 2.5 MG tablet Take 1 tablet (2.5 mg total) by mouth as needed for migraine. Take one (1) tablet at onset of headache; if returns or does not resolve, may repeat after 4 hours; do not exceed five (5) mg in 24 hours. Patient not taking: Reported on 07/06/2020 06/18/20   Reubin MilanBerglund, Laura H, MD  topiramate (TOPAMAX) 25 MG tablet Take 3 tablets (75 mg total) by mouth at bedtime. Patient not taking: Reported on 07/06/2020 03/12/20   Reubin MilanBerglund, Laura H, MD  traMADol (ULTRAM) 50 MG tablet Take 1 tablet (50 mg total) by mouth every 8 (eight) hours as needed (migraine HA). 08/03/20   Reubin MilanBerglund, Laura H, MD  diphenhydrAMINE HCl (BENADRYL PO) Take by mouth as needed.  09/03/20  [provider]  omeprazole (PRILOSEC) 10 MG capsule Take  1 capsule (10 mg total) by mouth daily. Patient not taking: Reported on 09/26/2019 09/24/19 12/06/19  Cuthriell, Delorise Royals, PA-C    Family History Family History  Problem Relation Age of Onset  . Other Mother        unknown medical history  . Other Father        unknown medical hisotry  . Endometriosis Maternal Grandmother     Social History Social History   Tobacco Use  . Smoking status: Current Every Day Smoker    Packs/day: 0.50    Years: 24.00    Pack years: 12.00    Types: Cigarettes    Start date: 55  . Smokeless tobacco: Never Used  Vaping Use  . Vaping Use: Never used  Substance Use Topics  . Alcohol use: No     Alcohol/week: 0.0 standard drinks  . Drug use: No     Allergies   Sulfa antibiotics   Review of Systems Review of Systems  Constitutional: Negative for activity change, appetite change and fever.  HENT: Negative for congestion and rhinorrhea.   Respiratory: Negative for cough and shortness of breath.   Cardiovascular: Negative for chest pain.  Gastrointestinal: Negative for abdominal pain, nausea and vomiting.  Genitourinary: Negative for dysuria and frequency.  Musculoskeletal: Negative for arthralgias and myalgias.  Skin: Negative for rash.       Patient has a red swollen area on her inner left thigh.  Neurological: Positive for headaches.  Hematological: Negative.   Psychiatric/Behavioral: Negative.      Physical Exam Triage Vital Signs ED Triage Vitals  Enc Vitals Group     BP 09/03/20 1322 (!) 130/92     Pulse Rate 09/03/20 1322 92     Resp 09/03/20 1322 18     Temp 09/03/20 1322 98.8 F (37.1 C)     Temp Source 09/03/20 1322 Oral     SpO2 09/03/20 1322 98 %     Weight 09/03/20 1322 181 lb (82.1 kg)     Height 09/03/20 1322 5' (1.524 m)     Head Circumference --      Peak Flow --      Pain Score 09/03/20 1321 6     Pain Loc --      Pain Edu? --      Excl. in GC? --    No data found.  Updated Vital Signs BP (!) 130/92 (BP Location: Left Arm)   Pulse 92   Temp 98.8 F (37.1 C) (Oral)   Resp 18   Ht 5' (1.524 m)   Wt 181 lb (82.1 kg)   LMP 08/02/2020 (Approximate)   SpO2 98%   BMI 35.35 kg/m   Visual Acuity Right Eye Distance:   Left Eye Distance:   Bilateral Distance:    Right Eye Near:   Left Eye Near:    Bilateral Near:     Physical Exam Vitals and nursing note reviewed.  Constitutional:      General: She is not in acute distress.    Appearance: Normal appearance. She is normal weight. She is not ill-appearing.  HENT:     Head: Normocephalic and atraumatic.  Eyes:     General: No scleral icterus.    Extraocular Movements: Extraocular  movements intact.     Conjunctiva/sclera: Conjunctivae normal.     Pupils: Pupils are equal, round, and reactive to light.  Cardiovascular:     Rate and Rhythm: Normal rate and regular rhythm.     Pulses:  Normal pulses.     Heart sounds: Normal heart sounds. No murmur heard.   Pulmonary:     Effort: Pulmonary effort is normal.     Breath sounds: Normal breath sounds. No wheezing, rhonchi or rales.  Musculoskeletal:        General: Swelling and tenderness present. No signs of injury. Normal range of motion.     Cervical back: Normal range of motion and neck supple.     Comments: Patient has a 1 cm x 2 cm fluctuant area of redness on her inner left proximal thigh.  The area has a white head on it.  There is extending redness around the swollen area that is approximately 6 cm x 8 cm.  Lymphadenopathy:     Cervical: No cervical adenopathy.  Skin:    General: Skin is warm and dry.     Capillary Refill: Capillary refill takes less than 2 seconds.     Findings: Erythema and lesion present.  Neurological:     General: No focal deficit present.     Mental Status: She is alert and oriented to person, place, and time.     Motor: No weakness.     Gait: Gait normal.  Psychiatric:        Mood and Affect: Mood normal.        Behavior: Behavior normal.        Thought Content: Thought content normal.        Judgment: Judgment normal.      UC Treatments / Results  Labs (all labs ordered are listed, but only abnormal results are displayed) Labs Reviewed - No data to display  EKG   Radiology No results found.  Procedures Procedures (including critical care time)  Medications Ordered in UC Medications - No data to display  Initial Impression / Assessment and Plan / UC Course  I have reviewed the triage vital signs and the nursing notes.  Pertinent labs & imaging results that were available during my care of the patient were reviewed by me and considered in my medical decision making  (see chart for details).   Patient is here for evaluation of a cyst on her inner left thigh and migraine headache.  She reports that the cyst is more concerning than the migraine.  There is a 1 cm x 2 cm area of fluctuance on her proximal inner left thigh that has a whitehead on it.  There is surrounding erythema without edema or induration anterior to the area of swelling.  No inguinal lymphadenopathy.  Patient advised that with application of warm compresses the lesion would rupture on its own.  Will cover for staph and strep with doxycycline twice daily x10 days.  Will give pain control and have patient follow-up with neurology for her migraine.   Final Clinical Impressions(s) / UC Diagnoses   Final diagnoses:  Abscess of left thigh  Migraine without status migrainosus, not intractable, unspecified migraine type     Discharge Instructions     Take the doxycycline twice daily for 10 days.  Take that with food.  Avoid the sun as it will make you more prone to sunburn.  Continually apply warm compresses to the abscess on your inner left thigh until it ruptures.  The abscess has a head on it so it should not take long for his rupture.  Use the Norco as needed for severe pain.  Do not take it with your tramadol if you still have some.  Use your Amerge  for your headache.  Follow-up with your neurologist.    ED Prescriptions    Medication Sig Dispense Auth. Provider   doxycycline (VIBRAMYCIN) 100 MG capsule Take 1 capsule (100 mg total) by mouth 2 (two) times daily. 20 capsule Becky Augusta, NP   HYDROcodone-acetaminophen (NORCO/VICODIN) 5-325 MG tablet Take 2 tablets by mouth every 4 (four) hours as needed. 6 tablet Becky Augusta, NP     I have reviewed the PDMP during this encounter.   Becky Augusta, NP 09/03/20 1408

## 2020-09-04 ENCOUNTER — Ambulatory Visit (INDEPENDENT_AMBULATORY_CARE_PROVIDER_SITE_OTHER): Payer: Self-pay | Admitting: Family Medicine

## 2020-09-05 ENCOUNTER — Other Ambulatory Visit: Payer: Self-pay | Admitting: Internal Medicine

## 2020-09-05 ENCOUNTER — Encounter: Payer: Self-pay | Admitting: Internal Medicine

## 2020-09-05 ENCOUNTER — Other Ambulatory Visit: Payer: Self-pay

## 2020-09-05 DIAGNOSIS — N200 Calculus of kidney: Secondary | ICD-10-CM

## 2020-09-05 DIAGNOSIS — G43009 Migraine without aura, not intractable, without status migrainosus: Secondary | ICD-10-CM

## 2020-09-05 DIAGNOSIS — I10 Essential (primary) hypertension: Secondary | ICD-10-CM

## 2020-09-05 MED ORDER — NARATRIPTAN HCL 2.5 MG PO TABS: 2.5000 mg | ORAL_TABLET | ORAL | 0 refills | Status: DC | PRN

## 2020-09-05 MED ORDER — TRAMADOL HCL 50 MG PO TABS: 50.0000 mg | ORAL_TABLET | Freq: Three times a day (TID) | ORAL | 0 refills | Status: DC | PRN

## 2020-09-05 MED ORDER — METOPROLOL SUCCINATE ER 50 MG PO TB24: 50.0000 mg | ORAL_TABLET | Freq: Every day | ORAL | 0 refills | Status: DC

## 2020-09-07 ENCOUNTER — Telehealth: Payer: Self-pay

## 2020-09-07 NOTE — Telephone Encounter (Signed)
Completed paper PA on patient Naratriptan 2.5mg  tablets- faxed to [facility] for Prior Approval.   Awaiting outcome from pharmacy through FAX.

## 2020-09-18 ENCOUNTER — Ambulatory Visit (INDEPENDENT_AMBULATORY_CARE_PROVIDER_SITE_OTHER): Payer: Self-pay | Admitting: Family Medicine

## 2020-09-19 ENCOUNTER — Encounter: Payer: Self-pay | Admitting: Internal Medicine

## 2020-09-23 ENCOUNTER — Other Ambulatory Visit: Payer: Self-pay

## 2020-09-23 ENCOUNTER — Encounter: Payer: Self-pay | Admitting: Emergency Medicine

## 2020-09-23 ENCOUNTER — Ambulatory Visit
Admission: EM | Admit: 2020-09-23 | Discharge: 2020-09-23 | Disposition: A | Discharge status: Home / Self Care | Payer: Medicaid Other | Attending: Physician Assistant | Admitting: Physician Assistant

## 2020-09-23 DIAGNOSIS — S40022A Contusion of left upper arm, initial encounter: Secondary | ICD-10-CM | POA: Diagnosis not present

## 2020-09-23 MED ORDER — IBUPROFEN 800 MG PO TABS: 800.0000 mg | ORAL_TABLET | Freq: Three times a day (TID) | ORAL | 0 refills | Status: AC | PRN

## 2020-09-23 NOTE — ED Provider Notes (Signed)
[facility]    CSN: [id number] Arrival date & time: 09/23/20  1026      History   Chief Complaint Chief Complaint  Patient presents with  . Arm Pain    left    HPI Amy Gomez is a 39 y.o. female presenting for left upper arm pain for the past 4 days.  She says that she donated plasma the day that the arm pain began.  She says that she got very sick during the donation had a vasovagal episode where she nearly passed out and vomited.  Patient states that she noticed bruising around the area the IV was placed soon after.  She has had persistent pain that she believes is getting worse.  She has 1 area of bruising and a larger area of bruising below that.  She says she has been icing the area and taking ibuprofen with improvement in the swelling.  She says that the swelling was initially really bad especially in her hands and she had to cut her wedding ring off.  She denies any swelling of her forearm or hand at this point.  She denies any fever, redness, warmth.  She has no history of blood clotting disorders or DVT or PE.  Past medical history is significant for hypertension, panic attacks, prediabetes, and schizoaffective disorder.  Patient states that she is concerned about possible blood clot and that is what she came in today.  She has no other concerns.  HPI  Past Medical History:  Diagnosis Date  . Hypertension   . Kidney stones   . Leukocytosis 08/16/2018  . Migraine     Patient Active Problem List   Diagnosis Date Noted  . Schizoaffective disorder, depressive type (HCC) 02/14/2020  . Rosacea 01/09/2020  . Panic disorder with agoraphobia 07/11/2019  . Pre-diabetes 01/26/2019  . Liver mass 01/25/2019  . Essential hypertension 07/15/2017  . Gastroesophageal reflux disease without esophagitis 12/18/2016  . History of kidney stones 06/04/2015  . Dysmenorrhea 06/04/2015  . Migraine without aura and without status migrainosus, not intractable 06/04/2015     Past Surgical History:  Procedure Laterality Date  . DILATION AND CURETTAGE OF UTERUS    . TUBAL LIGATION Bilateral 2007    OB History   No obstetric history on file.      Home Medications    Prior to Admission medications   Medication Sig Start Date End Date Taking? Authorizing Provider  cetirizine (ZYRTEC) 10 MG tablet Take 20 mg by mouth daily. 04/19/20  Yes [provider]  lisinopril (ZESTRIL) 20 MG tablet Take 1 tablet (20 mg total) by mouth daily. 05/30/20  Yes Reubin Milan, MD  metoprolol succinate (TOPROL-XL) 50 MG 24 hr tablet Take 1 tablet (50 mg total) by mouth daily. Take with or immediately following a meal. 09/05/20  Yes Reubin Milan, MD  naratriptan (AMERGE) 2.5 MG tablet Take 1 tablet (2.5 mg total) by mouth as needed for migraine. Take one (1) tablet at onset of headache; if returns or does not resolve, may repeat after 4 hours; do not exceed five (5) mg in 24 hours. 09/05/20  Yes Reubin Milan, MD  traMADol (ULTRAM) 50 MG tablet Take 1 tablet (50 mg total) by mouth every 8 (eight) hours as needed (migraine HA). 09/05/20  Yes Reubin Milan, MD  doxycycline (VIBRAMYCIN) 100 MG capsule Take 1 capsule (100 mg total) by mouth 2 (two) times daily. 09/03/20   Becky Augusta, NP  HYDROcodone-acetaminophen (NORCO/VICODIN) [phone]  MG tablet Take 2 tablets by mouth every 4 (four) hours as needed. 09/03/20   Becky Augustayan, Jeremy, NP  ibuprofen (ADVIL) 800 MG tablet Take 1 tablet (800 mg total) by mouth every 8 (eight) hours as needed for up to 7 days. 09/23/20 09/30/20  Eusebio FriendlyEaves, Katrinna Travieso B, PA-C  topiramate (TOPAMAX) 25 MG tablet Take 3 tablets (75 mg total) by mouth at bedtime. Patient not taking: Reported on 07/06/2020 03/12/20   Reubin MilanBerglund, Laura H, MD  diphenhydrAMINE HCl (BENADRYL PO) Take by mouth as needed.  09/03/20  [provider]  omeprazole (PRILOSEC) 10 MG capsule Take 1 capsule (10 mg total) by mouth daily. Patient not taking: Reported on 09/26/2019  09/24/19 12/06/19  Cuthriell, Delorise RoyalsJonathan D, PA-C    Family History Family History  Problem Relation Age of Onset  . Other Mother        unknown medical history  . Other Father        unknown medical hisotry  . Endometriosis Maternal Grandmother     Social History Social History   Tobacco Use  . Smoking status: Current Every Day Smoker    Packs/day: 0.50    Years: 24.00    Pack years: 12.00    Types: Cigarettes    Start date: [id number]  . Smokeless tobacco: Never Used  Vaping Use  . Vaping Use: Never used  Substance Use Topics  . Alcohol use: No    Alcohol/week: 0.0 standard drinks  . Drug use: No     Allergies   Sulfa antibiotics   Review of Systems Review of Systems  Constitutional: Negative for fatigue and fever.  Musculoskeletal: Positive for joint swelling and myalgias. Negative for back pain and neck pain.  Skin: Positive for color change. Negative for rash and wound.  Neurological: Negative for weakness and numbness.  Hematological: Does not bruise/bleed easily.     Physical Exam Triage Vital Signs ED Triage Vitals  Enc Vitals Group     BP 09/23/20 1039 (!) 138/95     Pulse Rate 09/23/20 1039 95     Resp 09/23/20 1039 18     Temp 09/23/20 1039 98.2 F (36.8 C)     Temp Source 09/23/20 1039 Oral     SpO2 09/23/20 1039 100 %     Weight 09/23/20 1039 180 lb (81.6 kg)     Height 09/23/20 1039 5' (1.524 m)     Head Circumference --      Peak Flow --      Pain Score 09/23/20 1038 6     Pain Loc --      Pain Edu? --      Excl. in GC? --    No data found.  Updated Vital Signs BP (!) 138/95 (BP Location: Right Arm)   Pulse 95   Temp 98.2 F (36.8 C) (Oral)   Resp 18   Ht 5' (1.524 m)   Wt 180 lb (81.6 kg)   LMP 09/16/2020 (Approximate)   SpO2 100%   BMI 35.15 kg/m       Physical Exam Vitals and nursing note reviewed.  Constitutional:      General: She is not in acute distress.    Appearance: Normal appearance. She is not ill-appearing or  toxic-appearing.  HENT:     Head: Normocephalic and atraumatic.  Eyes:     General: No scleral icterus.       Right eye: No discharge.        Left eye: No discharge.  Conjunctiva/sclera: Conjunctivae normal.  Cardiovascular:     Rate and Rhythm: Normal rate and regular rhythm.     Pulses: Normal pulses.     Heart sounds: Normal heart sounds.  Pulmonary:     Effort: Pulmonary effort is normal. No respiratory distress.     Breath sounds: Normal breath sounds.  Musculoskeletal:     Cervical back: Neck supple.  Skin:    General: Skin is dry.     Findings: Bruising (there is a small bruise of the left AC with a larger area of ecchymosis of the inner distal biceps. Areas of contusions are tender. Full ROM of arm, no swelling, erythema or warmth noted) present.  Neurological:     General: No focal deficit present.     Mental Status: She is alert. Mental status is at baseline.     Motor: No weakness.     Gait: Gait normal.  Psychiatric:        Mood and Affect: Mood normal.        Behavior: Behavior normal.        Thought Content: Thought content normal.      UC Treatments / Results  Labs (all labs ordered are listed, but only abnormal results are displayed) Labs Reviewed - No data to display  EKG   Radiology No results found.  Procedures Procedures (including critical care time)  Medications Ordered in UC Medications - No data to display  Initial Impression / Assessment and Plan / UC Course  I have reviewed the triage vital signs and the nursing notes.  Pertinent labs & imaging results that were available during my care of the patient were reviewed by me and considered in my medical decision making (see chart for details).   I believe patient to be low risk for DVT of the arm.  She has no history of blood clots.  She does not have cancer or immunocompromising condition.  She does not take oral contraceptives and does not lead a sedentary lifestyle.  Vitals are  stable.  She is afebrile.  Her pain is in the area of the contusions and I believe her arm pain is likely due to hematoma/contusion.  Advised her to switch to using warm compresses and heating pads.  Can take the ibuprofen if needed for severe pain, otherwise take Tylenol extra strength.  Advised elevating the extremity.  She says she believes she may build to follow-up with her PCP tomorrow.  Advised her to try to do so.  ED precautions discussed for any new or worsening symptoms or signs that could indicate DVT.  Final Clinical Impressions(s) / UC Diagnoses   Final diagnoses:  Contusion of multiple sites of left upper arm, initial encounter     Discharge Instructions     It is extremely rare to have a deep vein thrombus or clot after donating plasma.  Based on the appearance of your arm today, it is no consistent with multiple bruises likely due to a blown out vein during the process of the procedure.  Begin using warm compresses on the areas of swelling and bruising.  Take the ibuprofen as needed for severe pain.  Can also take Tylenol.  Follow-up with PCP tomorrow if you can.  Go to emergency department if you experience severe acute worsening of pain, increased swelling of the arm, redness or warmth of the arm or numbness and tingling.  Those will be signs that you should be evaluated for possible DVT of the arm.  ED Prescriptions    Medication Sig Dispense Auth. Provider   ibuprofen (ADVIL) 800 MG tablet Take 1 tablet (800 mg total) by mouth every 8 (eight) hours as needed for up to 7 days. 21 tablet Shirlee Latch, PA-C     PDMP not reviewed this encounter.   Shirlee Latch, PA-C 09/23/20 1109

## 2020-09-23 NOTE — ED Triage Notes (Signed)
Patient in today c/o left arm pain since donating plasma on Wednesday (09/19/20). Patient states the area started at 2 knots and now is painful, swollen and bruised. Patient states she had to cut her ring off due to the swelling. Patient has done cold compresses and Ibuprofen.

## 2020-09-23 NOTE — Discharge Instructions (Signed)
It is extremely rare to have a deep vein thrombus or clot after donating plasma.  Based on the appearance of your arm today, it is no consistent with multiple bruises likely due to a blown out vein during the process of the procedure.  Begin using warm compresses on the areas of swelling and bruising.  Take the ibuprofen as needed for severe pain.  Can also take Tylenol.  Follow-up with PCP tomorrow if you can.  Go to emergency department if you experience severe acute worsening of pain, increased swelling of the arm, redness or warmth of the arm or numbness and tingling.  Those will be signs that you should be evaluated for possible DVT of the arm.

## 2020-09-25 ENCOUNTER — Encounter: Payer: Self-pay | Admitting: Internal Medicine

## 2020-09-25 NOTE — Telephone Encounter (Signed)
Called and spoke with patient. Patient said Friday night she had to cut her wedding ring off Friday night. Then she woke up with that bruise after donating blood. Taking ibuprofen, and putting heating pad on it. Pain is getting worse. Swollen and bruised. Told her to continue heat and advil. She asked for a few tramadol and Dr Judithann Graves said she can not prescribe that for this. She verbalized understanding.

## 2020-09-28 ENCOUNTER — Ambulatory Visit: Payer: Medicaid Other | Admitting: Internal Medicine

## 2020-10-08 ENCOUNTER — Other Ambulatory Visit: Payer: Self-pay | Admitting: Internal Medicine

## 2020-10-08 DIAGNOSIS — N200 Calculus of kidney: Secondary | ICD-10-CM

## 2020-10-08 MED ORDER — TRAMADOL HCL 50 MG PO TABS: 50.0000 mg | ORAL_TABLET | Freq: Three times a day (TID) | ORAL | 0 refills | Status: DC | PRN

## 2020-10-18 LAB — BASIC METABOLIC PANEL
BUN: 7 (ref 4–21)
CO2: 29 — AB (ref 13–22)
Chloride: 105 (ref 99–108)
Creatinine: 0.7 (ref 0.5–1.1)
Potassium: 3.9 (ref 3.4–5.3)
Sodium: 140 (ref 137–147)

## 2020-10-18 LAB — HEPATIC FUNCTION PANEL
AST: 17 (ref 13–35)
Alkaline Phosphatase: 102 (ref 25–125)
Bilirubin, Total: 0.3

## 2020-10-18 LAB — CBC AND DIFFERENTIAL
HCT: 42 (ref 36–46)
Hemoglobin: 13.7 (ref 12.0–16.0)
Platelets: 253 (ref 150–399)
WBC: 13.2

## 2020-10-18 LAB — TSH: TSH: 1.78 (ref 0.41–5.90)

## 2020-10-29 ENCOUNTER — Encounter: Payer: Self-pay | Admitting: Internal Medicine

## 2020-10-29 ENCOUNTER — Other Ambulatory Visit: Payer: Self-pay

## 2020-10-29 ENCOUNTER — Other Ambulatory Visit
Admission: RE | Admit: 2020-10-29 | Discharge: 2020-10-29 | Disposition: A | Discharge status: Home / Self Care | Payer: Medicaid Other | Attending: Internal Medicine | Admitting: Internal Medicine

## 2020-10-29 ENCOUNTER — Ambulatory Visit (INDEPENDENT_AMBULATORY_CARE_PROVIDER_SITE_OTHER): Payer: Medicaid Other | Admitting: Internal Medicine

## 2020-10-29 VITALS — BP 118/72 | HR 104 | Temp 98.7°F | Ht 60.0 in | Wt 183.0 lb

## 2020-10-29 DIAGNOSIS — G43009 Migraine without aura, not intractable, without status migrainosus: Secondary | ICD-10-CM

## 2020-10-29 DIAGNOSIS — I10 Essential (primary) hypertension: Secondary | ICD-10-CM | POA: Insufficient documentation

## 2020-10-29 DIAGNOSIS — R21 Rash and other nonspecific skin eruption: Secondary | ICD-10-CM

## 2020-10-29 LAB — CBC WITH DIFFERENTIAL/PLATELET
Abs Immature Granulocytes: 0.13 10*3/uL — ABNORMAL HIGH (ref 0.00–0.07)
Basophils Absolute: 0.1 10*3/uL (ref 0.0–0.1)
Basophils Relative: 1 %
Eosinophils Absolute: 0.2 10*3/uL (ref 0.0–0.5)
Eosinophils Relative: 1 %
HCT: 39.7 % (ref 36.0–46.0)
Hemoglobin: 13.7 g/dL (ref 12.0–15.0)
Immature Granulocytes: 1 %
Lymphocytes Relative: 20 %
Lymphs Abs: 3.3 10*3/uL (ref 0.7–4.0)
MCH: 29.1 pg (ref 26.0–34.0)
MCHC: 34.5 g/dL (ref 30.0–36.0)
MCV: 84.3 fL (ref 80.0–100.0)
Monocytes Absolute: 1.3 10*3/uL — ABNORMAL HIGH (ref 0.1–1.0)
Monocytes Relative: 8 %
Neutro Abs: 11.4 10*3/uL — ABNORMAL HIGH (ref 1.7–7.7)
Neutrophils Relative %: 69 %
Platelets: 244 10*3/uL (ref 150–400)
RBC: 4.71 MIL/uL (ref 3.87–5.11)
RDW: 14.1 % (ref 11.5–15.5)
WBC: 16.4 10*3/uL — ABNORMAL HIGH (ref 4.0–10.5)
nRBC: 0 % (ref 0.0–0.2)

## 2020-10-29 MED ORDER — LISINOPRIL 20 MG PO TABS: 20.0000 mg | ORAL_TABLET | Freq: Every day | ORAL | 1 refills | Status: DC

## 2020-10-29 MED ORDER — METOPROLOL SUCCINATE ER 50 MG PO TB24: 50.0000 mg | ORAL_TABLET | Freq: Every day | ORAL | 1 refills | Status: DC

## 2020-10-29 MED ORDER — TOPIRAMATE 25 MG PO TABS: 25.0000 mg | ORAL_TABLET | Freq: Every day | ORAL | 1 refills | Status: DC

## 2020-10-29 MED ORDER — TRAMADOL HCL 50 MG PO TABS: 50.0000 mg | ORAL_TABLET | Freq: Three times a day (TID) | ORAL | 0 refills | Status: DC | PRN

## 2020-10-29 NOTE — Progress Notes (Signed)
Date:  10/29/2020   Name:  Amy Gomez   DOB:  Jul 06, 1981   MRN:  [id number]   Chief Complaint: Labs Only (Topimax smaller dose)  Hypertension This is a chronic problem. The problem is controlled. Associated symptoms include headaches. Pertinent negatives include no chest pain, palpitations or shortness of breath. Past treatments include ACE inhibitors and beta blockers. The current treatment provides significant improvement. There are no compliance problems.  There is no history of kidney disease or CAD/MI.  Migraine  This is a recurrent problem. The problem occurs intermittently. The pain does not radiate. The pain quality is similar to prior headaches. Pertinent negatives include no coughing, dizziness or fever. She has tried beta blockers (tramadol and topamax) for the symptoms. Her past medical history is significant for hypertension.  Elevated CBC - her WBC has been mildly elevated the past 2 months.  Dermatology suggested that she have it rechecked. The rash has still not been biopsied as it comes and goes in a few days time.   Lab Results  Component Value Date   CREATININE 0.69 02/14/2020   BUN 10 02/14/2020   NA 137 02/14/2020   K 4.3 02/14/2020   CL 104 02/14/2020   CO2 25 02/14/2020   No results found for: CHOL, HDL, LDLCALC, LDLDIRECT, TRIG, CHOLHDL Lab Results  Component Value Date   TSH 1.78 10/18/2020   Lab Results  Component Value Date   HGBA1C 5.8 (H) 01/26/2019   Lab Results  Component Value Date   WBC 13.2 10/18/2020   HGB 13.7 10/18/2020   HCT 42 10/18/2020   MCV 87.0 09/24/2019   PLT 253 10/18/2020   Lab Results  Component Value Date   ALT 18 01/26/2019   AST 22 01/26/2019   ALKPHOS 67 01/26/2019   BILITOT 0.6 01/26/2019     Review of Systems  Constitutional: Negative for appetite change, diaphoresis, fatigue and fever.  HENT: Negative for trouble swallowing.   Respiratory: Negative for cough, chest tightness and shortness of breath.    Cardiovascular: Negative for chest pain, palpitations and leg swelling.  Skin: Positive for rash.  Neurological: Positive for headaches. Negative for dizziness and light-headedness.  Psychiatric/Behavioral: Negative for dysphoric mood and sleep disturbance. The patient is not nervous/anxious.     Patient Active Problem List   Diagnosis Date Noted  . Schizoaffective disorder, depressive type (HCC) 02/14/2020  . Rosacea 01/09/2020  . Panic disorder with agoraphobia 07/11/2019  . Pre-diabetes 01/26/2019  . Liver mass 01/25/2019  . Essential hypertension 07/15/2017  . Gastroesophageal reflux disease without esophagitis 12/18/2016  . History of kidney stones 06/04/2015  . Dysmenorrhea 06/04/2015  . Migraine without aura and without status migrainosus, not intractable 06/04/2015    Allergies  Allergen Reactions  . Sulfa Antibiotics Anaphylaxis, Swelling, Rash and Other (See Comments)    Past Surgical History:  Procedure Laterality Date  . DILATION AND CURETTAGE OF UTERUS    . TUBAL LIGATION Bilateral 2007    Social History   Tobacco Use  . Smoking status: Current Every Day Smoker    Packs/day: 0.50    Years: 24.00    Pack years: 12.00    Types: Cigarettes    Start date: 54  . Smokeless tobacco: Never Used  Vaping Use  . Vaping Use: Never used  Substance Use Topics  . Alcohol use: No    Alcohol/week: 0.0 standard drinks  . Drug use: No     Medication list has been reviewed and updated.  Current Meds  Medication Sig  . traMADol (ULTRAM) 50 MG tablet Take 1 tablet (50 mg total) by mouth every 8 (eight) hours as needed (migraine HA).  . [DISCONTINUED] lisinopril (ZESTRIL) 20 MG tablet Take 1 tablet (20 mg total) by mouth daily.  . [DISCONTINUED] metoprolol succinate (TOPROL-XL) 50 MG 24 hr tablet Take 1 tablet (50 mg total) by mouth daily. Take with or immediately following a meal.    PHQ 2/9 Scores 10/29/2020 05/30/2020 02/14/2020 01/09/2020  PHQ - 2 Score 0 0 2 3   PHQ- 9 Score 0 3 16 15     GAD 7 : Generalized Anxiety Score 10/29/2020 05/30/2020 02/14/2020 01/09/2020  Nervous, Anxious, on Edge 0 0 3 3  Control/stop worrying 0 0 3 3  Worry too much - different things 0 0 3 3  Trouble relaxing 0 0 3 2  Restless 0 0 3 1  Easily annoyed or irritable 0 0 2 3  Afraid - awful might happen 0 0 1 2  Total GAD 7 Score 0 0 18 17  Anxiety Difficulty - Not difficult at all Extremely difficult Extremely difficult    BP Readings from Last 3 Encounters:  10/29/20 118/72  09/23/20 (!) 138/95  09/03/20 (!) 130/92    Physical Exam Vitals and nursing note reviewed.  Constitutional:      General: She is not in acute distress.    Appearance: She is well-developed.  HENT:     Head: Normocephalic and atraumatic.  Cardiovascular:     Rate and Rhythm: Normal rate and regular rhythm.     Pulses: Normal pulses.  Pulmonary:     Effort: Pulmonary effort is normal. No respiratory distress.  Musculoskeletal:        General: Normal range of motion.     Cervical back: Normal range of motion.  Lymphadenopathy:     Cervical: No cervical adenopathy.  Skin:    General: Skin is warm and dry.     Capillary Refill: Capillary refill takes less than 2 seconds.     Findings: No rash.  Neurological:     General: No focal deficit present.     Mental Status: She is alert and oriented to person, place, and time.  Psychiatric:        Mood and Affect: Mood and affect and mood normal.     Wt Readings from Last 3 Encounters:  10/29/20 183 lb (83 kg)  09/23/20 180 lb (81.6 kg)  09/03/20 181 lb (82.1 kg)    BP 118/72   Pulse (!) 104   Temp 98.7 F (37.1 C) (Oral)   Ht 5' (1.524 m)   Wt 183 lb (83 kg)   LMP 10/09/2020   SpO2 95%   BMI 35.74 kg/m   Assessment and Plan: 1. Essential hypertension Clinically stable exam with well controlled BP. Tolerating medications without side effects at this time. Pt to continue current regimen and low sodium diet; benefits of  regular exercise as able discussed. Repeat CBC - CBC with Differential/Platelet - lisinopril (ZESTRIL) 20 MG tablet; Take 1 tablet (20 mg total) by mouth daily.  Dispense: 90 tablet; Refill: 1 - metoprolol succinate (TOPROL-XL) 50 MG 24 hr tablet; Take 1 tablet (50 mg total) by mouth daily. Take with or immediately following a meal.  Dispense: 90 tablet; Refill: 1  2. Migraine without aura and without status migrainosus, not intractable Continue Tramadol PRN Resume Topamax 25 mg qhs - topiramate (TOPAMAX) 25 MG tablet; Take 1 tablet (25 mg total)  by mouth at bedtime.  Dispense: 90 tablet; Refill: 1 - traMADol (ULTRAM) 50 MG tablet; Take 1 tablet (50 mg total) by mouth every 8 (eight) hours as needed (migraine HA).  Dispense: 20 tablet; Refill: 0  3. Rash and nonspecific skin eruption Continue Zyrtec PRN - sample of Xyzal given Follow up with Dermatology     Partially dictated using Dragon software. Any errors are unintentional.  Bari Edward, MD [facility]  10/29/2020

## 2020-11-06 ENCOUNTER — Encounter: Payer: Self-pay | Admitting: Internal Medicine

## 2020-11-23 ENCOUNTER — Encounter: Payer: Self-pay | Admitting: Internal Medicine

## 2020-11-23 ENCOUNTER — Other Ambulatory Visit: Payer: Self-pay | Admitting: Internal Medicine

## 2020-11-23 DIAGNOSIS — G43009 Migraine without aura, not intractable, without status migrainosus: Secondary | ICD-10-CM

## 2020-11-23 MED ORDER — TRAMADOL HCL 50 MG PO TABS: 50.0000 mg | ORAL_TABLET | Freq: Three times a day (TID) | ORAL | 0 refills | Status: DC | PRN

## 2020-11-23 NOTE — Telephone Encounter (Signed)
Please review.  KP

## 2020-11-23 NOTE — Telephone Encounter (Signed)
Pt response.  KP

## 2020-11-25 ENCOUNTER — Encounter: Payer: Self-pay | Admitting: Internal Medicine

## 2020-11-26 ENCOUNTER — Other Ambulatory Visit: Payer: Self-pay | Admitting: Internal Medicine

## 2020-11-26 DIAGNOSIS — J029 Acute pharyngitis, unspecified: Secondary | ICD-10-CM

## 2020-11-26 MED ORDER — AMOXICILLIN 875 MG PO TABS: 875.0000 mg | ORAL_TABLET | Freq: Two times a day (BID) | ORAL | 0 refills | Status: AC

## 2020-11-30 ENCOUNTER — Encounter: Payer: Self-pay | Admitting: Internal Medicine

## 2020-12-03 ENCOUNTER — Encounter: Payer: Self-pay | Admitting: Internal Medicine

## 2020-12-10 ENCOUNTER — Encounter: Payer: Self-pay | Admitting: Internal Medicine

## 2020-12-17 ENCOUNTER — Encounter: Payer: Self-pay | Admitting: Internal Medicine

## 2020-12-17 ENCOUNTER — Ambulatory Visit
Admission: RE | Admit: 2020-12-17 | Discharge: 2020-12-17 | Disposition: A | Discharge status: Home / Self Care | Payer: Medicaid Other | Attending: Internal Medicine | Admitting: Internal Medicine

## 2020-12-17 ENCOUNTER — Ambulatory Visit (INDEPENDENT_AMBULATORY_CARE_PROVIDER_SITE_OTHER): Payer: Medicaid Other | Admitting: Internal Medicine

## 2020-12-17 ENCOUNTER — Ambulatory Visit
Admission: RE | Admit: 2020-12-17 | Discharge: 2020-12-17 | Disposition: A | Discharge status: Home / Self Care | Payer: Medicaid Other | Source: Ambulatory Visit | Attending: Internal Medicine | Admitting: Internal Medicine

## 2020-12-17 ENCOUNTER — Other Ambulatory Visit
Admission: RE | Admit: 2020-12-17 | Discharge: 2020-12-17 | Disposition: A | Discharge status: Home / Self Care | Payer: Medicaid Other | Source: Home / Self Care | Attending: Internal Medicine | Admitting: Internal Medicine

## 2020-12-17 ENCOUNTER — Other Ambulatory Visit: Payer: Self-pay

## 2020-12-17 VITALS — BP 134/84 | HR 97 | Temp 98.0°F | Ht 60.0 in | Wt 183.0 lb

## 2020-12-17 DIAGNOSIS — Z20822 Contact with and (suspected) exposure to covid-19: Secondary | ICD-10-CM

## 2020-12-17 DIAGNOSIS — R1011 Right upper quadrant pain: Secondary | ICD-10-CM

## 2020-12-17 DIAGNOSIS — R519 Headache, unspecified: Secondary | ICD-10-CM

## 2020-12-17 DIAGNOSIS — R058 Other specified cough: Secondary | ICD-10-CM | POA: Diagnosis not present

## 2020-12-17 LAB — POCT URINALYSIS DIPSTICK
Bilirubin, UA: NEGATIVE
Blood, UA: NEGATIVE
Glucose, UA: NEGATIVE
Ketones, UA: NEGATIVE
Leukocytes, UA: NEGATIVE
Nitrite, UA: NEGATIVE
Protein, UA: NEGATIVE
Spec Grav, UA: <=1.005 — AB (ref 1.010–1.025)
Urobilinogen, UA: 0.2 E.U./dL
pH, UA: 7.5 (ref 5.0–8.0)

## 2020-12-17 LAB — COMPREHENSIVE METABOLIC PANEL
ALT: 17 U/L (ref 0–44)
AST: 17 U/L (ref 15–41)
Albumin: 3.6 g/dL (ref 3.5–5.0)
Alkaline Phosphatase: 69 U/L (ref 38–126)
Anion gap: 11 (ref 5–15)
BUN: 12 mg/dL (ref 6–20)
CO2: 21 mmol/L — ABNORMAL LOW (ref 22–32)
Calcium: 8.7 mg/dL — ABNORMAL LOW (ref 8.9–10.3)
Chloride: 105 mmol/L (ref 98–111)
Creatinine, Ser: 0.6 mg/dL (ref 0.44–1.00)
GFR, Estimated: >60 mL/min (ref >60)
Glucose, Bld: 115 mg/dL — ABNORMAL HIGH (ref 70–99)
Potassium: 3.9 mmol/L (ref 3.5–5.1)
Sodium: 137 mmol/L (ref 135–145)
Total Bilirubin: 0.6 mg/dL (ref 0.3–1.2)
Total Protein: 6.7 g/dL (ref 6.5–8.1)

## 2020-12-17 LAB — CBC WITH DIFFERENTIAL/PLATELET
Abs Immature Granulocytes: 0.09 10*3/uL — ABNORMAL HIGH (ref 0.00–0.07)
Basophils Absolute: 0.1 10*3/uL (ref 0.0–0.1)
Basophils Relative: 1 %
Eosinophils Absolute: 0.2 10*3/uL (ref 0.0–0.5)
Eosinophils Relative: 2 %
HCT: 40.1 % (ref 36.0–46.0)
Hemoglobin: 13.5 g/dL (ref 12.0–15.0)
Immature Granulocytes: 1 %
Lymphocytes Relative: 19 %
Lymphs Abs: 2.5 10*3/uL (ref 0.7–4.0)
MCH: 28.2 pg (ref 26.0–34.0)
MCHC: 33.7 g/dL (ref 30.0–36.0)
MCV: 83.9 fL (ref 80.0–100.0)
Monocytes Absolute: 0.9 10*3/uL (ref 0.1–1.0)
Monocytes Relative: 7 %
Neutro Abs: 9.4 10*3/uL — ABNORMAL HIGH (ref 1.7–7.7)
Neutrophils Relative %: 70 %
Platelets: 277 10*3/uL (ref 150–400)
RBC: 4.78 MIL/uL (ref 3.87–5.11)
RDW: 14 % (ref 11.5–15.5)
WBC: 13.2 10*3/uL — ABNORMAL HIGH (ref 4.0–10.5)
nRBC: 0 % (ref 0.0–0.2)

## 2020-12-17 MED ORDER — CYCLOBENZAPRINE HCL 10 MG PO TABS: 10.0000 mg | ORAL_TABLET | Freq: Every day | ORAL | 0 refills | Status: DC

## 2020-12-17 NOTE — Progress Notes (Signed)
Date:  12/17/2020   Name:  Amy Gomez   DOB:  Feb 17, 1981   MRN:  [id number]   Chief Complaint: Flank Pain (X2 weeks, Right side near rib cage, sensitive, comes and goes/constant, happened since she had covid a month ago, shaking, sometimes drops things out of hands) and Migraine (Worse, constant at night)  Flank Pain This is a recurrent problem. The problem is unchanged. The quality of the pain is described as aching and cramping. Associated symptoms include abdominal pain (ruq) and headaches. Pertinent negatives include no chest pain, fever or weakness.  Migraine  This is a new (different quality of pain) problem. Episode onset: since covid infection last month. Episode frequency: occuring every night. The pain radiates to the face (and vertex). Associated symptoms include abdominal pain (ruq) and coughing. Pertinent negatives include no fever, vomiting or weakness. Nothing aggravates the symptoms. Treatments tried: tramadol. The treatment provided no relief.  Cough This is a new problem. The current episode started in the past 7 days. The problem occurs every few minutes. The cough is non-productive. Associated symptoms include headaches, shortness of breath and wheezing. Pertinent negatives include no chest pain, chills or fever.    Lab Results  Component Value Date   CREATININE 0.7 10/18/2020   BUN 7 10/18/2020   NA 140 10/18/2020   K 3.9 10/18/2020   CL 105 10/18/2020   CO2 29 (A) 10/18/2020   No results found for: CHOL, HDL, LDLCALC, LDLDIRECT, TRIG, CHOLHDL Lab Results  Component Value Date   TSH 1.78 10/18/2020   Lab Results  Component Value Date   HGBA1C 5.8 (H) 01/26/2019   Lab Results  Component Value Date   WBC 16.4 (H) 10/29/2020   HGB 13.7 10/29/2020   HCT 39.7 10/29/2020   MCV 84.3 10/29/2020   PLT 244 10/29/2020   Lab Results  Component Value Date   ALT 18 01/26/2019   AST 17 10/18/2020   ALKPHOS 102 10/18/2020   BILITOT 0.6 01/26/2019      Review of Systems  Constitutional: Negative for chills, fatigue and fever.  Respiratory: Positive for cough, shortness of breath and wheezing.   Cardiovascular: Negative for chest pain, palpitations and leg swelling.  Gastrointestinal: Positive for abdominal pain (ruq). Negative for constipation, diarrhea and vomiting.  Genitourinary: Positive for flank pain.  Neurological: Positive for headaches. Negative for weakness.  Psychiatric/Behavioral: Negative for dysphoric mood and sleep disturbance. The patient is not nervous/anxious.     Patient Active Problem List   Diagnosis Date Noted  . Schizoaffective disorder, depressive type (HCC) 02/14/2020  . Rosacea 01/09/2020  . Panic disorder with agoraphobia 07/11/2019  . Pre-diabetes 01/26/2019  . Liver mass 01/25/2019  . Essential hypertension 07/15/2017  . Gastroesophageal reflux disease without esophagitis 12/18/2016  . History of kidney stones 06/04/2015  . Dysmenorrhea 06/04/2015  . Migraine without aura and without status migrainosus, not intractable 06/04/2015    Allergies  Allergen Reactions  . Sulfa Antibiotics Anaphylaxis, Swelling, Rash and Other (See Comments)    Past Surgical History:  Procedure Laterality Date  . DILATION AND CURETTAGE OF UTERUS    . TUBAL LIGATION Bilateral 2007    Social History   Tobacco Use  . Smoking status: Current Every Day Smoker    Packs/day: 0.50    Years: 24.00    Pack years: 12.00    Types: Cigarettes    Start date: 41  . Smokeless tobacco: Never Used  Vaping Use  . Vaping Use: Never used  Substance Use Topics  . Alcohol use: No    Alcohol/week: 0.0 standard drinks  . Drug use: No     Medication list has been reviewed and updated.  Current Meds  Medication Sig  . lisinopril (ZESTRIL) 20 MG tablet Take 1 tablet (20 mg total) by mouth daily.  . metoprolol succinate (TOPROL-XL) 50 MG 24 hr tablet Take 1 tablet (50 mg total) by mouth daily. Take with or immediately  following a meal.    PHQ 2/9 Scores 12/17/2020 10/29/2020 05/30/2020 02/14/2020  PHQ - 2 Score 0 0 0 2  PHQ- 9 Score 9 0 3 16    GAD 7 : Generalized Anxiety Score 12/17/2020 10/29/2020 05/30/2020 02/14/2020  Nervous, Anxious, on Edge 1 0 0 3  Control/stop worrying 0 0 0 3  Worry too much - different things 0 0 0 3  Trouble relaxing 0 0 0 3  Restless 0 0 0 3  Easily annoyed or irritable 3 0 0 2  Afraid - awful might happen 0 0 0 1  Total GAD 7 Score 4 0 0 18  Anxiety Difficulty - - Not difficult at all Extremely difficult    BP Readings from Last 3 Encounters:  12/17/20 134/84  10/29/20 118/72  09/23/20 (!) 138/95    Physical Exam Constitutional:      Appearance: Normal appearance.  HENT:     Head: Normocephalic and atraumatic.     Jaw: There is normal jaw occlusion.     Nose:     Right Sinus: No maxillary sinus tenderness or frontal sinus tenderness.     Left Sinus: No maxillary sinus tenderness or frontal sinus tenderness.  Cardiovascular:     Rate and Rhythm: Normal rate and regular rhythm.     Pulses: Normal pulses.     Heart sounds: Normal heart sounds.  Pulmonary:     Effort: Pulmonary effort is normal.     Breath sounds: Examination of the right-upper field reveals wheezing. Examination of the left-upper field reveals wheezing. Examination of the right-lower field reveals wheezing. Wheezing present. No rhonchi.  Abdominal:     Palpations: Abdomen is soft.     Tenderness: There is abdominal tenderness in the right upper quadrant. There is no right CVA tenderness, left CVA tenderness, guarding or rebound.  Musculoskeletal:     Cervical back: Normal range of motion.  Lymphadenopathy:     Cervical: No cervical adenopathy.  Neurological:     Mental Status: She is alert.  Psychiatric:        Attention and Perception: Attention normal.     Wt Readings from Last 3 Encounters:  12/17/20 183 lb (83 kg)  10/29/20 183 lb (83 kg)  09/23/20 180 lb (81.6 kg)    BP 134/84    Pulse 97   Temp 98 F (36.7 C) (Oral)   Ht 5' (1.524 m)   Wt 183 lb (83 kg)   LMP 11/18/2020   SpO2 99%   BMI 35.74 kg/m   Assessment and Plan: 1. RUQ abdominal pain May be referred pain from LLQ pulmonary process vs post covid sx Will get CXR and labs - Comprehensive metabolic panel - POCT urinalysis dipstick  2. Intractable episodic headache, unspecified headache type Appears to be muscle tension - start flexeril at bedtime - cyclobenzaprine (FLEXERIL) 10 MG tablet; Take 1 tablet (10 mg total) by mouth at bedtime.  Dispense: 30 tablet; Refill: 0  3. Cough with exposure to COVID-19 virus CXR and CBC Will call in  antibiotics if appropriate - CBC with Differential/Platelet - DG Chest 2 View   Partially dictated using Dragon software. Any errors are unintentional.  Bari Edward, MD [facility]  12/17/2020

## 2021-01-02 ENCOUNTER — Encounter: Payer: Self-pay | Admitting: Internal Medicine

## 2021-01-03 ENCOUNTER — Other Ambulatory Visit: Payer: Self-pay | Admitting: Internal Medicine

## 2021-01-03 ENCOUNTER — Telehealth: Payer: Self-pay | Admitting: Internal Medicine

## 2021-01-03 DIAGNOSIS — G43009 Migraine without aura, not intractable, without status migrainosus: Secondary | ICD-10-CM

## 2021-01-03 MED ORDER — TRAMADOL HCL 50 MG PO TABS: 50.0000 mg | ORAL_TABLET | Freq: Three times a day (TID) | ORAL | 0 refills | Status: DC | PRN

## 2021-01-03 NOTE — Telephone Encounter (Signed)
Pt states she is leaving for [employer]. . Pt requesting her  traMADol (ULTRAM) 50 MG tablet  Today.   [id number] - [place], [place] - [address]  Pt has to leave because pt's mother in law is very sick and she needs to go for about a month to care for her. Pt also sent Mychart message

## 2021-01-30 ENCOUNTER — Other Ambulatory Visit: Payer: Self-pay

## 2021-01-30 ENCOUNTER — Encounter: Payer: Self-pay | Admitting: Internal Medicine

## 2021-01-30 ENCOUNTER — Ambulatory Visit (INDEPENDENT_AMBULATORY_CARE_PROVIDER_SITE_OTHER): Payer: Medicaid Other | Admitting: Internal Medicine

## 2021-01-30 VITALS — BP 136/86 | HR 111 | Temp 97.7°F | Ht 60.0 in | Wt 186.0 lb

## 2021-01-30 DIAGNOSIS — G43009 Migraine without aura, not intractable, without status migrainosus: Secondary | ICD-10-CM | POA: Diagnosis not present

## 2021-01-30 DIAGNOSIS — S161XXA Strain of muscle, fascia and tendon at neck level, initial encounter: Secondary | ICD-10-CM | POA: Diagnosis not present

## 2021-01-30 DIAGNOSIS — M25512 Pain in left shoulder: Secondary | ICD-10-CM

## 2021-01-30 MED ORDER — METHOCARBAMOL 750 MG PO TABS: 750.0000 mg | ORAL_TABLET | Freq: Four times a day (QID) | ORAL | 0 refills | Status: DC | PRN

## 2021-01-30 MED ORDER — TRAMADOL HCL 50 MG PO TABS: 50.0000 mg | ORAL_TABLET | Freq: Three times a day (TID) | ORAL | 0 refills | Status: DC | PRN

## 2021-01-30 MED ORDER — IBUPROFEN 600 MG PO TABS: 600.0000 mg | ORAL_TABLET | Freq: Four times a day (QID) | ORAL | 1 refills | Status: DC | PRN

## 2021-01-30 NOTE — Progress Notes (Signed)
Date:  01/30/2021   Name:  Amy Gomez   DOB:  [birth year]   MRN:  [id number]   Chief Complaint: Back Pain (Neck pain radiates to lower back) and Neck Pain (Started on Friday, 01/25/21, when patient went to pick up her niece; she stated she heard a pop; Monday, 01/28/21, 85 lb dog jumped across body per patient causes more pain and patient felt like she cracked a rib on the right side; intermittent tinnitus associated; taking ibuprofen with no relief; more left-sided neck and shoulder; 6/10 pain)  Neck Pain  This is a new problem. The current episode started in the past 7 days. The problem occurs constantly. The problem has been unchanged. The pain is associated with lifting a heavy object. The pain is present in the left side and midline. The pain is moderate. The symptoms are aggravated by twisting and position. The pain is same all the time. Associated symptoms include headaches and tingling. Pertinent negatives include no chest pain or fever.  Shoulder Injury  The incident occurred at home. The left shoulder is affected. The incident occurred 3 to 5 days ago. Injury mechanism: large dog jumped onto her chest. The quality of the pain is described as shooting and cramping. The pain radiates to the chest. The pain is moderate. Associated symptoms include tingling. Pertinent negatives include no chest pain or muscle weakness. The symptoms are aggravated by movement, overhead lifting and palpation. She has tried NSAIDs for the symptoms.    Lab Results  Component Value Date   CREATININE 0.60 12/17/2020   BUN 12 12/17/2020   NA 137 12/17/2020   K 3.9 12/17/2020   CL 105 12/17/2020   CO2 21 (L) 12/17/2020   No results found for: CHOL, HDL, LDLCALC, LDLDIRECT, TRIG, CHOLHDL Lab Results  Component Value Date   TSH 1.78 10/18/2020   Lab Results  Component Value Date   HGBA1C 5.8 (H) 01/26/2019   Lab Results  Component Value Date   WBC 13.2 (H) 12/17/2020   HGB 13.5 12/17/2020   HCT  40.1 12/17/2020   MCV 83.9 12/17/2020   PLT 277 12/17/2020   Lab Results  Component Value Date   ALT 17 12/17/2020   AST 17 12/17/2020   ALKPHOS 69 12/17/2020   BILITOT 0.6 12/17/2020     Review of Systems  Constitutional: Negative for chills, fatigue and fever.  Respiratory: Negative for cough, chest tightness, shortness of breath and wheezing.   Cardiovascular: Negative for chest pain and palpitations.  Musculoskeletal: Positive for arthralgias, back pain, myalgias and neck pain.  Neurological: Positive for tingling and headaches.    Patient Active Problem List   Diagnosis Date Noted  . Schizoaffective disorder, depressive type (HCC) 02/14/2020  . Rosacea 01/09/2020  . Panic disorder with agoraphobia 07/11/2019  . Pre-diabetes 01/26/2019  . Liver mass 01/25/2019  . Essential hypertension 07/15/2017  . Gastroesophageal reflux disease without esophagitis 12/18/2016  . History of kidney stones 06/04/2015  . Dysmenorrhea 06/04/2015  . Migraine without aura and without status migrainosus, not intractable 06/04/2015    Allergies  Allergen Reactions  . Sulfa Antibiotics Anaphylaxis, Swelling, Rash and Other (See Comments)    Past Surgical History:  Procedure Laterality Date  . DILATION AND CURETTAGE OF UTERUS    . TUBAL LIGATION Bilateral 2007    Social History   Tobacco Use  . Smoking status: Current Every Day Smoker    Packs/day: 0.50    Years: 24.00    Pack years:  12.00    Types: Cigarettes    Start date: 68  . Smokeless tobacco: Never Used  Vaping Use  . Vaping Use: Never used  Substance Use Topics  . Alcohol use: No    Alcohol/week: 0.0 standard drinks  . Drug use: No     Medication list has been reviewed and updated.  Current Meds  Medication Sig  . ibuprofen (ADVIL) 600 MG tablet Take 1 tablet (600 mg total) by mouth every 6 (six) hours as needed.  Marland Kitchen lisinopril (ZESTRIL) 20 MG tablet Take 1 tablet (20 mg total) by mouth daily.  .  methocarbamol (ROBAXIN) 750 MG tablet Take 1 tablet (750 mg total) by mouth every 6 (six) hours as needed for muscle spasms.  . metoprolol succinate (TOPROL-XL) 50 MG 24 hr tablet Take 1 tablet (50 mg total) by mouth daily. Take with or immediately following a meal.    PHQ 2/9 Scores 01/30/2021 12/17/2020 10/29/2020 05/30/2020  PHQ - 2 Score 0 0 0 0  PHQ- 9 Score 0 9 0 3    GAD 7 : Generalized Anxiety Score 01/30/2021 12/17/2020 10/29/2020 05/30/2020  Nervous, Anxious, on Edge 1 1 0 0  Control/stop worrying 2 0 0 0  Worry too much - different things 2 0 0 0  Trouble relaxing 1 0 0 0  Restless 1 0 0 0  Easily annoyed or irritable 1 3 0 0  Afraid - awful might happen 1 0 0 0  Total GAD 7 Score 9 4 0 0  Anxiety Difficulty Not difficult at all - - Not difficult at all    BP Readings from Last 3 Encounters:  01/30/21 136/86  12/17/20 134/84  10/29/20 118/72    Physical Exam Constitutional:      General: She is in acute distress.  Cardiovascular:     Rate and Rhythm: Normal rate and regular rhythm.     Pulses: Normal pulses.     Heart sounds: No murmur heard.   Pulmonary:     Effort: Pulmonary effort is normal.     Breath sounds: Normal breath sounds. No wheezing or rhonchi.  Chest:     Chest wall: Tenderness (along left lateral ribs) present. No mass.  Musculoskeletal:     Right shoulder: Normal.     Left shoulder: Tenderness and crepitus present. Decreased range of motion. Normal strength. Normal pulse.     Cervical back: Spasms and tenderness present. No bony tenderness. Decreased range of motion.  Neurological:     Mental Status: She is alert.     Wt Readings from Last 3 Encounters:  01/30/21 186 lb (84.4 kg)  12/17/20 183 lb (83 kg)  10/29/20 183 lb (83 kg)    BP 136/86 (BP Location: Right Arm, Patient Position: Sitting, Cuff Size: Normal)   Pulse (!) 111   Temp 97.7 F (36.5 C) (Oral)   Ht 5' (1.524 m)   Wt 186 lb (84.4 kg)   SpO2 96%   BMI 36.33 kg/m    Assessment and Plan: 1. Strain of neck muscle, initial encounter Recommend Ice along with advil and robaxin - ibuprofen (ADVIL) 600 MG tablet; Take 1 tablet (600 mg total) by mouth every 6 (six) hours as needed.  Dispense: 120 tablet; Refill: 1 - methocarbamol (ROBAXIN) 750 MG tablet; Take 1 tablet (750 mg total) by mouth every 6 (six) hours as needed for muscle spasms.  Dispense: 120 tablet; Refill: 0  2. Acute pain of left shoulder Possible tendonitis - vs rotator  cuff tear - ibuprofen (ADVIL) 600 MG tablet; Take 1 tablet (600 mg total) by mouth every 6 (six) hours as needed.  Dispense: 120 tablet; Refill: 1  3. Migraine without aura and without status migrainosus, not intractable - traMADol (ULTRAM) 50 MG tablet; Take 1 tablet (50 mg total) by mouth every 8 (eight) hours as needed (migraine HA).  Dispense: 20 tablet; Refill: 0   Partially dictated using Animal nutritionist. Any errors are unintentional.  Bari Edward, MD [facility]  01/30/2021

## 2021-01-31 ENCOUNTER — Encounter: Payer: Self-pay | Admitting: Internal Medicine

## 2021-02-01 ENCOUNTER — Ambulatory Visit
Admission: EM | Admit: 2021-02-01 | Discharge: 2021-02-01 | Disposition: A | Discharge status: Other Acute Inpatient Hospital | Payer: Medicaid Other

## 2021-02-01 ENCOUNTER — Encounter: Payer: Self-pay | Admitting: Emergency Medicine

## 2021-02-01 ENCOUNTER — Other Ambulatory Visit: Payer: Self-pay

## 2021-02-01 DIAGNOSIS — R29898 Other symptoms and signs involving the musculoskeletal system: Secondary | ICD-10-CM | POA: Diagnosis not present

## 2021-02-01 DIAGNOSIS — M542 Cervicalgia: Secondary | ICD-10-CM | POA: Diagnosis not present

## 2021-02-01 NOTE — ED Notes (Signed)
Patient is being discharged from the Urgent Care and sent to the Emergency Department via POV . Per Becky Augusta, NP, patient is in need of higher level of care due to acute symptoms. Patient is aware and verbalizes understanding of plan of care.  Vitals:   02/01/21 1027  BP: (!) 152/98  Pulse: 91  Resp: 15  Temp: 98.2 F (36.8 C)  SpO2: 98%

## 2021-02-01 NOTE — Discharge Instructions (Addendum)
Please go to the emergency department at [facility] in [place] for further evaluation and imaging of your neck and left shoulder.

## 2021-02-01 NOTE — ED Provider Notes (Signed)
[facility]    CSN: [id number] Arrival date & time: 02/01/21  1009      History   Chief Complaint Chief Complaint  Patient presents with  . Neck Pain  . Shoulder Pain    left    HPI Talula Alila Sotero is a 40 y.o. female.   HPI 40 year old female here for evaluation of left neck and shoulder pain.  Patient reports that 1 week ago she bent down to interact with her niece in someway and felt a sharp pop in the left side of her neck that went down her left shoulder and arm.  Patient reports that on the following Monday her dog jumped up on her and landed on her left shoulder and caused the pain to increase.  Patient was evaluated by her primary care doctor 2 days ago and was given ibuprofen and muscle relaxers.  Patient reports that since then her pain is continued to worsen and she is now unable to move her left shoulder.  Patient reports that her pain also increases with movement of her neck and that she has tingling in her fingertips and occasionally she will get numbness to her whole arm.  Past Medical History:  Diagnosis Date  . Hypertension   . Kidney stones   . Leukocytosis 08/16/2018  . Migraine     Patient Active Problem List   Diagnosis Date Noted  . Schizoaffective disorder, depressive type (HCC) 02/14/2020  . Rosacea 01/09/2020  . Panic disorder with agoraphobia 07/11/2019  . Pre-diabetes 01/26/2019  . Liver mass 01/25/2019  . Essential hypertension 07/15/2017  . Gastroesophageal reflux disease without esophagitis 12/18/2016  . History of kidney stones 06/04/2015  . Dysmenorrhea 06/04/2015  . Migraine without aura and without status migrainosus, not intractable 06/04/2015    Past Surgical History:  Procedure Laterality Date  . DILATION AND CURETTAGE OF UTERUS    . TUBAL LIGATION Bilateral 2007    OB History   No obstetric history on file.      Home Medications    Prior to Admission medications   Medication Sig Start Date End Date  Taking? Authorizing Provider  ibuprofen (ADVIL) 600 MG tablet Take 1 tablet (600 mg total) by mouth every 6 (six) hours as needed. 01/30/21  Yes Reubin Milan, MD  lisinopril (ZESTRIL) 20 MG tablet Take 1 tablet (20 mg total) by mouth daily. 10/29/20  Yes Reubin Milan, MD  methocarbamol (ROBAXIN) 750 MG tablet Take 1 tablet (750 mg total) by mouth every 6 (six) hours as needed for muscle spasms. 01/30/21  Yes Reubin Milan, MD  metoprolol succinate (TOPROL-XL) 50 MG 24 hr tablet Take 1 tablet (50 mg total) by mouth daily. Take with or immediately following a meal. 10/29/20  Yes Reubin Milan, MD  traMADol (ULTRAM) 50 MG tablet Take 1 tablet (50 mg total) by mouth every 8 (eight) hours as needed (migraine HA). 01/30/21  Yes Reubin Milan, MD  cyclobenzaprine (FLEXERIL) 10 MG tablet Take 1 tablet (10 mg total) by mouth at bedtime. Patient not taking: No sig reported 12/17/20   Reubin Milan, MD  topiramate (TOPAMAX) 25 MG tablet Take 1 tablet (25 mg total) by mouth at bedtime. Patient not taking: No sig reported 10/29/20   Reubin Milan, MD  diphenhydrAMINE HCl (BENADRYL PO) Take by mouth as needed.  09/03/20  [provider]  omeprazole (PRILOSEC) 10 MG capsule Take 1 capsule (10 mg total) by mouth daily. Patient not taking: No  sig reported 09/24/19 12/06/19  Cuthriell, Delorise Royals, PA-C    Family History Family History  Problem Relation Age of Onset  . Other Mother        unknown medical history  . Other Father        unknown medical hisotry  . Endometriosis Maternal Grandmother     Social History Social History   Tobacco Use  . Smoking status: Current Every Day Smoker    Packs/day: 0.50    Years: 24.00    Pack years: 12.00    Types: Cigarettes    Start date: 70  . Smokeless tobacco: Never Used  Vaping Use  . Vaping Use: Never used  Substance Use Topics  . Alcohol use: No    Alcohol/week: 0.0 standard drinks  . Drug use: No     Allergies    Sulfa antibiotics   Review of Systems Review of Systems  Musculoskeletal: Positive for myalgias and neck pain. Negative for joint swelling.  Skin: Negative for color change.  Neurological: Positive for weakness and numbness.  Hematological: Negative.   Psychiatric/Behavioral: Negative.      Physical Exam Triage Vital Signs ED Triage Vitals [02/01/21 1023]  Enc Vitals Group     BP      Pulse      Resp      Temp      Temp src      SpO2      Weight 186 lb (84.4 kg)     Height 5\' 1"  (1.549 m)     Head Circumference      Peak Flow      Pain Score 10     Pain Loc      Pain Edu?      Excl. in GC?    No data found.  Updated Vital Signs BP (!) 152/98 (BP Location: Right Arm)   Pulse 91   Temp 98.2 F (36.8 C) (Oral)   Resp 15   Ht 5\' 1"  (1.549 m)   Wt 186 lb (84.4 kg)   LMP 01/25/2021 (Approximate)   SpO2 98%   BMI 35.14 kg/m   Visual Acuity Right Eye Distance:   Left Eye Distance:   Bilateral Distance:    Right Eye Near:   Left Eye Near:    Bilateral Near:     Physical Exam Vitals and nursing note reviewed.  Constitutional:      General: She is in acute distress.     Appearance: Normal appearance. She is not ill-appearing.  HENT:     Head: Normocephalic and atraumatic.  Musculoskeletal:        General: Swelling and tenderness present.  Skin:    General: Skin is warm and dry.     Capillary Refill: Capillary refill takes less than 2 seconds.     Findings: No bruising.  Neurological:     General: No focal deficit present.     Mental Status: She is alert and oriented to person, place, and time.     Sensory: No sensory deficit.     Motor: Weakness present.  Psychiatric:        Mood and Affect: Mood normal.        Behavior: Behavior normal.        Thought Content: Thought content normal.        Judgment: Judgment normal.      UC Treatments / Results  Labs (all labs ordered are listed, but only abnormal results are displayed) Labs Reviewed -  No  data to display  EKG   Radiology No results found.  Procedures Procedures (including critical care time)  Medications Ordered in UC Medications - No data to display  Initial Impression / Assessment and Plan / UC Course  I have reviewed the triage vital signs and the nursing notes.  Pertinent labs & imaging results that were available during my care of the patient were reviewed by me and considered in my medical decision making (see chart for details).   Patient is a 40 year old female here for evaluation of left-sided neck pain and shoulder pain with numbness radiating into the left arm and to her fingertips.  Physical exam reveals a patient who appears to be in a moderate degree of distress and pain with tenderness to palpation of C6 and C7.  Patient has both bony tenderness and paraspinous tenderness.  There is a large degree of spasm in the superior aspect of the left shoulder, possibly the trapezius, as well as tenderness with palpation of the anterior and posterior aspect of the left shoulder joint.  Patient has decreased grip strength on the left 3/5 as compared to 5/5 on the right.  Patient also has decreased left upper extremity strength 3/5 with pushes and pulls as compared to 5/5 with the right.  Patient states that with passive flexion and extension of her left forearm she has pain in her left elbow that she describes as feeling bruised.  Patient also has tenderness to palpation of the triceps muscle on the left side as well as the deltoid muscle.  No ecchymosis or erythema noted.  Concern for acute cervical or thoracic spine issue requiring MRI for full evaluation.  I explained to the patient that we do not have the ability to order a CT scan or MRI at the urgent care today.  Patient was given options [facility] [place] where they have an MRI machine at their facility or going to the emergency department at either [facility] or [facility] where her orthopedic and neurosurgery  specialties are available.  Patient has opted to go to the emergency department at [facility] in [place].  Patient left in stable condition via POV.   Final Clinical Impressions(s) / UC Diagnoses   Final diagnoses:  Neck pain  Left arm weakness     Discharge Instructions     Please go to the emergency department at [facility] in [place] for further evaluation and imaging of your neck and left shoulder.    ED Prescriptions    None     PDMP not reviewed this encounter.   Becky Augusta, NP 02/01/21 1052

## 2021-02-01 NOTE — ED Triage Notes (Signed)
Patient states that last Friday she bent down to pick up her niece and felt a pop on the left side of her neck and pain that radiates in to her left shoulder.  Patient states that on Wed she saw her PCP and was given a muscle relaxer and ibuprofen for the pain.  Patient states that her pain has gotten worse and has been unable to move her left shoulder.  Patient states that her PCP told her to follow-up at the ED or [facility].

## 2021-02-04 ENCOUNTER — Telehealth: Payer: Self-pay

## 2021-02-04 ENCOUNTER — Ambulatory Visit
Admission: RE | Admit: 2021-02-04 | Discharge: 2021-02-04 | Disposition: A | Discharge status: Home / Self Care | Payer: Medicaid Other | Attending: Family Medicine | Admitting: Family Medicine

## 2021-02-04 ENCOUNTER — Ambulatory Visit
Admission: RE | Admit: 2021-02-04 | Discharge: 2021-02-04 | Disposition: A | Discharge status: Home / Self Care | Payer: Medicaid Other | Source: Ambulatory Visit | Attending: Family Medicine | Admitting: Family Medicine

## 2021-02-04 ENCOUNTER — Other Ambulatory Visit: Payer: Self-pay

## 2021-02-04 ENCOUNTER — Ambulatory Visit (INDEPENDENT_AMBULATORY_CARE_PROVIDER_SITE_OTHER): Payer: Medicaid Other | Admitting: Family Medicine

## 2021-02-04 ENCOUNTER — Encounter: Payer: Self-pay | Admitting: Family Medicine

## 2021-02-04 VITALS — BP 138/86 | HR 105 | Temp 98.1°F | Ht 61.0 in | Wt 183.0 lb

## 2021-02-04 DIAGNOSIS — M62838 Other muscle spasm: Secondary | ICD-10-CM | POA: Insufficient documentation

## 2021-02-04 DIAGNOSIS — M7592 Shoulder lesion, unspecified, left shoulder: Secondary | ICD-10-CM | POA: Insufficient documentation

## 2021-02-04 DIAGNOSIS — M5412 Radiculopathy, cervical region: Secondary | ICD-10-CM

## 2021-02-04 MED ORDER — DICLOFENAC SODIUM 75 MG PO TBEC: 75.0000 mg | DELAYED_RELEASE_TABLET | Freq: Two times a day (BID) | ORAL | 0 refills | Status: DC

## 2021-02-04 MED ORDER — TRIAMCINOLONE ACETONIDE 40 MG/ML IJ SUSP
40.0000 mg | Freq: Once | INTRAMUSCULAR | Status: AC
Start: 2021-02-04 — End: 2021-02-04
  Administered 2021-02-04: 40 mg via INTRA_ARTICULAR

## 2021-02-04 MED ORDER — BACLOFEN 20 MG PO TABS: 20.0000 mg | ORAL_TABLET | Freq: Every evening | ORAL | 0 refills | Status: DC | PRN

## 2021-02-04 MED ORDER — TRIAMCINOLONE ACETONIDE 40 MG/ML IJ SUSP: 40.0000 mg | Freq: Once | INTRAMUSCULAR | Status: DC

## 2021-02-04 NOTE — Addendum Note (Signed)
Addended by: Lennart Pall on: 02/04/2021 01:38 PM   Modules accepted: Orders

## 2021-02-04 NOTE — Telephone Encounter (Signed)
Please call pt to see if she can come in today to see Dr. Jerolyn Center for her shoulder / neck pain.  KP

## 2021-02-04 NOTE — Assessment & Plan Note (Addendum)
Patient with intermittent left cervical radiculopathy findings on exam, with equivocal left Spurling's, contralateral benign, severely limited extension and left greater than right paraspinal tightness.  Her strength in the upper extremities is otherwise preserved and sensation appears to be intact and symmetric.  Plan for scheduled diclofenac, as needed baclofen, plain films, gentle range of motion exercises at home, and a status update in 4 weeks.  If suboptimal response despite adherence to the above, can consider formal physical therapy, escalation/transition of pharmacotherapy, advanced imaging.  Patient's primary care physician, Dr. Judithann Graves did review patient's pertinent medical history, initial plan, and treatment to date including outside evaluation with me in detail and care plan discussed.

## 2021-02-04 NOTE — Progress Notes (Signed)
New Patient Office Visit  Subjective:  Patient ID: Amy Gomez, female    DOB: 22-Sep-1981  Age: 40 y.o. MRN: [id number]  CC:  Chief Complaint  Patient presents with  . New Patient (Initial Visit)  . Shoulder Pain    Left; Started on Friday, 01/25/21, when patient went to pick up her niece; she stated she heard a pop; Monday, 01/28/21, 85 lb dog jumped across body per patient causes more pain and patient felt like she cracked a rib on the right side; intermittent tinnitus associated; more left-sided neck and shoulder; radiates down arm; saw [facility] 02/01/21 and was given a Toradol shot in right arm and was prescribed Percocet; nausea and vomiting associated with pain; 10/10 pain today in office    HPI Amy Gomez, RHD presents for left shoulder and neck pain, see above details.  Past Medical History:  Diagnosis Date  . Hypertension   . Kidney stones   . Leukocytosis 08/16/2018  . Migraine     Past Surgical History:  Procedure Laterality Date  . DILATION AND CURETTAGE OF UTERUS    . TUBAL LIGATION Bilateral 2007    Family History  Problem Relation Age of Onset  . Other Mother        unknown medical history  . Other Father        unknown medical hisotry  . Endometriosis Maternal Grandmother     Social History   Socioeconomic History  . Marital status: Married    Spouse name: Kiowa Peifer  . Number of children: Not on file  . Years of education: Not on file  . Highest education level: Not on file  Occupational History  . Not on file  Tobacco Use  . Smoking status: Current Every Day Smoker    Packs/day: 0.50    Years: 24.00    Pack years: 12.00    Types: Cigarettes    Start date: 76  . Smokeless tobacco: Never Used  Vaping Use  . Vaping Use: Never used  Substance and Sexual Activity  . Alcohol use: No    Alcohol/week: 0.0 standard drinks  . Drug use: No  . Sexual activity: Not on file  Other Topics Concern  . Not on file  Social History  Narrative  . Not on file   Social Determinants of Health   Financial Resource Strain: Not on file  Food Insecurity: Not on file  Transportation Needs: Not on file  Physical Activity: Not on file  Stress: Not on file  Social Connections: Not on file  Intimate Partner Violence: Not on file    ROS Review of Systems per HPI  Objective:   Today's Vitals: BP 138/86 (BP Location: Right Arm, Patient Position: Sitting, Cuff Size: Normal)   Pulse (!) 105   Temp 98.1 F (36.7 C) (Oral)   Ht 5\' 1"  (1.549 m)   Wt 183 lb (83 kg)   LMP 01/25/2021 (Approximate)   SpO2 97%   BMI 34.58 kg/m   Physical Exam pertinent details in A&P  A steroid injection was performed at left shoulder using 1% plain Lidocaine and 40 mg of Kenalog. This was well tolerated. Procedure:  Injection of left shoulder subacromial under ultrasound guidance Consent obtained and verified. Time-out conducted. Noted no overlying erythema, induration, or other signs of local infection. Skin prepped in a sterile fashion. Topical analgesic spray: Ethyl chloride. Completed without difficulty. Images stored. Meds: Advised to call if fevers/chills, erythema, induration, drainage, or persistent bleeding.  Assessment & Plan:   Problem List Items Addressed This Visit      Nervous and Auditory   Left cervical radiculopathy - Primary    Patient with intermittent left cervical radiculopathy findings on exam, with equivocal left Spurling's, contralateral benign, severely limited extension and left greater than right paraspinal tightness.  Her strength in the upper extremities is otherwise preserved and sensation appears to be intact and symmetric.  Plan for scheduled diclofenac, as needed baclofen, plain films, gentle range of motion exercises at home, and a status update in 4 weeks.  If suboptimal response despite adherence to the above, can consider formal physical therapy, escalation/transition of pharmacotherapy,  advanced imaging.  Patient's primary care physician, Dr. Judithann Graves did review patient's pertinent medical history, initial plan, and treatment to date including outside evaluation with me in detail and care plan discussed.      Relevant Medications   diclofenac (VOLTAREN) 75 MG EC tablet   baclofen (LIORESAL) 20 MG tablet   Other Relevant Orders   DG Cervical Spine With Flex & Extend     Musculoskeletal and Integument   Supraspinatus tendinitis, left    Patient with left shoulder symptoms that show focality to the supraspinatus with 5 -/5 strength during isolated testing, otherwise rotator cuff and shoulder appears benign.  These shoulder symptoms are considered secondary/compensatory in nature due to comorbid cervical issues.  In an effort to address shoulder symptoms and keep at bay, she did elect to proceed with ultrasound-guided subacromial corticosteroid injection.  She will be placed on additional medications as mentioned, plain films of left shoulder to be ordered, and status update in 4 weeks.  If suboptimal progress, formal PT, pharmacotherapy modifications, and advanced imaging can all be considered.      Relevant Medications   diclofenac (VOLTAREN) 75 MG EC tablet   baclofen (LIORESAL) 20 MG tablet   Other Relevant Orders   DG Shoulder Left     Other   Cervical paraspinal muscle spasm   Relevant Medications   diclofenac (VOLTAREN) 75 MG EC tablet   baclofen (LIORESAL) 20 MG tablet   Other Relevant Orders   DG Cervical Spine With Flex & Extend      Outpatient Encounter Medications as of 02/04/2021  Medication Sig  . baclofen (LIORESAL) 20 MG tablet Take 1 tablet (20 mg total) by mouth at bedtime as needed for muscle spasms.  . diclofenac (VOLTAREN) 75 MG EC tablet Take 1 tablet (75 mg total) by mouth 2 (two) times daily.  Marland Kitchen lisinopril (ZESTRIL) 20 MG tablet Take 1 tablet (20 mg total) by mouth daily.  . metoprolol succinate (TOPROL-XL) 50 MG 24 hr tablet Take 1 tablet  (50 mg total) by mouth daily. Take with or immediately following a meal.  . oxyCODONE (OXY IR/ROXICODONE) 5 MG immediate release tablet Take 5 mg by mouth every 4 (four) hours as needed.  . traMADol (ULTRAM) 50 MG tablet Take 1 tablet (50 mg total) by mouth every 8 (eight) hours as needed (migraine HA).  . cyclobenzaprine (FLEXERIL) 10 MG tablet Take 1 tablet (10 mg total) by mouth at bedtime.  Marland Kitchen ibuprofen (ADVIL) 600 MG tablet Take 1 tablet (600 mg total) by mouth every 6 (six) hours as needed. (Patient not taking: Reported on 02/04/2021)  . topiramate (TOPAMAX) 25 MG tablet Take 1 tablet (25 mg total) by mouth at bedtime. (Patient not taking: Reported on 02/04/2021)  . [DISCONTINUED] diphenhydrAMINE HCl (BENADRYL PO) Take by mouth as needed.  . [DISCONTINUED] methocarbamol (ROBAXIN)  750 MG tablet Take 1 tablet (750 mg total) by mouth every 6 (six) hours as needed for muscle spasms. (Patient not taking: Reported on 02/04/2021)  . [DISCONTINUED] omeprazole (PRILOSEC) 10 MG capsule Take 1 capsule (10 mg total) by mouth daily. (Patient not taking: No sig reported)   No facility-administered encounter medications on file as of 02/04/2021.    Follow-up: Return CONTACT us IN 4 WEEKS.   Jerrol Banana, MD

## 2021-02-04 NOTE — Telephone Encounter (Unsigned)
Copied from CRM [id number]. Topic: General - Other >> Feb 04, 2021  8:05 AM Amy Gomez wrote: Reason for CRM: Patient is calling the doctor to let her know that her shoulder pain has gotten worse.  She stated that the doctor wanted her to call if she wasn't feeling any better so that possibly she could see a specialist.  Please call patient to discuss at [phone]

## 2021-02-04 NOTE — Patient Instructions (Signed)
-  You received an injection of cortisone today, take relative rest for the next 2-3 days, apply ice at 20-minute intervals, and slowly return to normal activity thereafter -Start diclofenac and dose twice daily with food for 2 weeks, after 2 weeks can dose twice per day on an as-needed basis for neck/shoulder pain -Can trial a muscle relaxer baclofen at nighttime on an as-needed basis if beneficial -Try to maintain gentle neck and shoulder motion using pain as a guide to avoid stiffness -Obtain x-rays of the neck and shoulder with order provided, our clinic will reach out to you to review results -Contact us in 4 weeks to provide a status update or for any questions between now and then

## 2021-02-04 NOTE — Assessment & Plan Note (Signed)
Patient with left shoulder symptoms that show focality to the supraspinatus with 5 -/5 strength during isolated testing, otherwise rotator cuff and shoulder appears benign.  These shoulder symptoms are considered secondary/compensatory in nature due to comorbid cervical issues.  In an effort to address shoulder symptoms and keep at bay, she did elect to proceed with ultrasound-guided subacromial corticosteroid injection.  She will be placed on additional medications as mentioned, plain films of left shoulder to be ordered, and status update in 4 weeks.  If suboptimal progress, formal PT, pharmacotherapy modifications, and advanced imaging can all be considered.

## 2021-02-05 NOTE — Progress Notes (Signed)
Patient notified and verbalized understanding.  Per patient still having pain, but it has decreased since having the cortisone injection.  Rated pain on a 0-10 scale as a 4-5 today.  States she will follow regimen Dr. Ashley Royalty recommended.  Will update our office at the end of the week via phone call or MyChart message.  For your information.

## 2021-02-05 NOTE — Progress Notes (Signed)
The x-rays that we obtained of the neck and shoulder do have findings that are consistent with your symptoms. Specifically, on your neck x-ray the alignment or "shape" of the neck shows that you were in significant spasm (muscles locked up) when the x-ray was taken, additionally there is one neck level that shows some wear and tear and that happens to be the level that has a nerve that covers the area of your arm symptoms.  Fortunately the shoulder x-ray looks good (the bones are fine without wear and tear).   Keep up with the medications and steps outlined at our visit and contact me in 4 weeks (sooner for questions)

## 2021-02-06 ENCOUNTER — Other Ambulatory Visit: Payer: Self-pay | Admitting: Family Medicine

## 2021-02-06 ENCOUNTER — Encounter: Payer: Self-pay | Admitting: Internal Medicine

## 2021-02-06 DIAGNOSIS — R1110 Vomiting, unspecified: Secondary | ICD-10-CM

## 2021-02-06 MED ORDER — ONDANSETRON HCL 4 MG PO TABS: 4.0000 mg | ORAL_TABLET | Freq: Three times a day (TID) | ORAL | 0 refills | Status: DC | PRN

## 2021-02-07 ENCOUNTER — Encounter: Payer: Self-pay | Admitting: Family Medicine

## 2021-02-12 ENCOUNTER — Encounter: Payer: Self-pay | Admitting: Family Medicine

## 2021-02-12 ENCOUNTER — Other Ambulatory Visit: Payer: Self-pay | Admitting: Family Medicine

## 2021-02-12 DIAGNOSIS — M5412 Radiculopathy, cervical region: Secondary | ICD-10-CM

## 2021-02-12 DIAGNOSIS — M62838 Other muscle spasm: Secondary | ICD-10-CM

## 2021-02-12 DIAGNOSIS — M7592 Shoulder lesion, unspecified, left shoulder: Secondary | ICD-10-CM

## 2021-02-12 MED ORDER — DICLOFENAC SODIUM 75 MG PO TBEC: 75.0000 mg | DELAYED_RELEASE_TABLET | Freq: Two times a day (BID) | ORAL | 0 refills | Status: DC | PRN

## 2021-02-12 NOTE — Telephone Encounter (Signed)
Please advise patient.  

## 2021-02-13 ENCOUNTER — Telehealth: Payer: Self-pay

## 2021-02-13 NOTE — Telephone Encounter (Signed)
See other encounter.

## 2021-02-13 NOTE — Telephone Encounter (Signed)
Noted  For your information   

## 2021-02-13 NOTE — Telephone Encounter (Signed)
Please schedule patient for the first week of May with Dr. Ashley Royalty to follow-up on symptoms.  Patient will get a call to set up Physical Therapy appointment also.

## 2021-02-13 NOTE — Telephone Encounter (Signed)
-----   Message from Jerrol Banana, MD sent at 02/12/2021  4:42 PM EDT ----- Regarding: Schedule visit Spoke to patient regarding symptoms, plan for additional medications and start of PT (I placed referral).   Please coordinate follow-up visit for the first week of May with me to see how she is doing. Thanks

## 2021-02-15 ENCOUNTER — Other Ambulatory Visit: Payer: Self-pay | Admitting: Family Medicine

## 2021-02-15 DIAGNOSIS — M5412 Radiculopathy, cervical region: Secondary | ICD-10-CM

## 2021-02-15 DIAGNOSIS — M7592 Shoulder lesion, unspecified, left shoulder: Secondary | ICD-10-CM

## 2021-02-15 DIAGNOSIS — M62838 Other muscle spasm: Secondary | ICD-10-CM

## 2021-02-15 NOTE — Telephone Encounter (Signed)
Requested medication (s) are due for refill today:  no  Requested medication (s) are on the active medication list: yes  Last refill: 02/04/2021  Future visit scheduled: yes  Notes to clinic: this refill cannot be delegated    Requested Prescriptions  Pending Prescriptions Disp Refills   baclofen (LIORESAL) 20 MG tablet [Pharmacy Med Name: Baclofen 20 MG Oral Tablet] 30 tablet 0    Sig: TAKE 1 TABLET BY MOUTH AT BEDTIME AS NEEDED FOR MUSCLE SPASM      Not Delegated - Analgesics:  Muscle Relaxants Failed - 02/15/2021  1:33 PM      Failed - This refill cannot be delegated      Passed - Valid encounter within last 6 months    Recent Outpatient Visits           1 week ago Left cervical radiculopathy   [facility] Jerrol Banana, MD   2 weeks ago Strain of neck muscle, initial encounter   [facility] Reubin Milan, MD   2 months ago RUQ abdominal pain   [facility] Reubin Milan, MD   3 months ago Essential hypertension   [facility] Reubin Milan, MD   7 months ago Fibrocystic breast changes, right   [facility] Reubin Milan, MD       Future Appointments             In 3 weeks Ashley Royalty, Ocie Bob, MD [facility]

## 2021-02-17 ENCOUNTER — Encounter: Payer: Self-pay | Admitting: Family Medicine

## 2021-02-18 NOTE — Telephone Encounter (Signed)
Unsure what medication patient is referring to.  Diclofenac was sent in on 02/12/21 and Baclofen was sent about a week prior to that.  PT referral is pending as of 02/12/21 also.  It typically takes 5-7 business days for a referral to be reviewed and patient to be contacted with an appointment.  Will reach out to [facility] PT as I have already spoken with our referral coordinator and the referral was successfully processed.

## 2021-02-25 ENCOUNTER — Ambulatory Visit: Payer: Medicaid Other | Admitting: Physical Therapy

## 2021-02-26 ENCOUNTER — Ambulatory Visit: Payer: Medicaid Other | Attending: Family Medicine | Admitting: Physical Therapy

## 2021-02-26 ENCOUNTER — Other Ambulatory Visit: Payer: Self-pay

## 2021-02-26 ENCOUNTER — Encounter: Payer: Self-pay | Admitting: Physical Therapy

## 2021-02-26 DIAGNOSIS — M6281 Muscle weakness (generalized): Secondary | ICD-10-CM | POA: Diagnosis present

## 2021-02-26 DIAGNOSIS — M25512 Pain in left shoulder: Secondary | ICD-10-CM | POA: Insufficient documentation

## 2021-02-26 NOTE — Therapy (Signed)
[facility] [address]. [place], [place], [address] Phone: [phone]   Fax:  [phone]  Physical Therapy Evaluation  Patient Details  Name: Amy Gomez MRN: [id number] Date of Birth: 04-27-1981 Referring Provider (PT): Joseph Berkshire   Encounter Date: 02/26/2021   PT End of Session - 02/26/21 0935    Visit Number 1    Number of Visits 9    Date for PT Re-Evaluation 03/26/21    PT Start Time 0935    PT Stop Time 1010    PT Time Calculation (min) 35 min    Activity Tolerance Patient tolerated treatment well    Behavior During Therapy [facility] for tasks assessed/performed           Past Medical History:  Diagnosis Date  . Hypertension   . Kidney stones   . Leukocytosis 08/16/2018  . Migraine     Past Surgical History:  Procedure Laterality Date  . DILATION AND CURETTAGE OF UTERUS    . TUBAL LIGATION Bilateral 2007    There were no vitals filed for this visit.        [facility] PT Assessment - 02/26/21 0933      Assessment   Medical Diagnosis L shoulder pain    Referring Provider (PT) Joseph Berkshire    Hand Dominance Right    Next MD Visit 03/11/2021    Prior Therapy None      Balance Screen   Has the patient fallen in the past 6 months No          SUBJECTIVE Chief complaint:  Patient notes that she has an 85 pound dog and he got excited and jumped on patient. When the dog landed, she had increased pain on R, but now pain is on the L. Patient notes every morning the pain in the L neck and shoulder comes back and limits movement. Patient also has difficulty with basic hygiene and dressing because of shoulder pain. Patient notes she gets some relief when her arm is over her head. Patient is not able to wash hair as regularly because of pain. Patient is also unable to do laundry because of pain. Patient also limited with driving 2/2 to pain with looking left.   Patient also complains of tinnitus; denies any  dizziness nor lightheadedness.   Pain location: L neck and shoulder Pain: Present 5-6/10, Best  5/10, Worst 10/10: unclear cause and variable causes Pain quality: pain quality: burning and deep throbbing; sometimes sharp Radiating pain: Yes  Numbness/Tingling: Yes; 5th finger is worst  24 hour pain behavior: worse in AM Aggravating factors: prolonged positioning, activity Easing factors: injection helped for 3 days; medications help with sleep;    Imaging: Yes    Current activities: walking, being outside  Patient stated goals: driving, hygiene, laundry  SCREENING Red Flags: None noted on eval. Have you had any increased drowsiness? No. Unexplained nausea and vomiting? No. Changes to vision, dizziness, speech? Yes to speech; notes stuttering and difficulty with word finding. Denies slurring. No difficulty noted over the course of the evaluation. Any trouble swallowing food/beverage? No. Unexplained changes in bowel or bladder habits? No.  Mental Status Patient is oriented to person, place and time.  Recent memory is intact.  Remote memory is intact.  Attention span and concentration are intact.  Expressive speech is intact.  Patient's fund of knowledge is within normal limits for educational level.  POSTURE/OBSERVATIONS:  Cervical lordosis: WNL Thoracic kyphosis: mildly increased Shoulder height:  L shoulder significantly lower than R Torticollis: mild to R  REFLEX TESTING: Hoffman's: Negative  RANGE OF MOTION:    LEFT RIGHT  Cervical forward flexion:  34 degrees    Cervical extension: 32 degrees *    Cervical lateral flexion:  28 degrees 21 degrees  Cervical rotation:    20 degrees* 60 degrees*  Shoulder Flexion:   100 160  Shoulder IR:  To L4 To L3  Shoulder Abduction:  115 140    SENSATION: Grossly intact to light touch bilateral LEs as determined by testing dermatomes C2-T1. Proprioception and hot/cold testing deferred on this date  STRENGTH: MMT  deferred 2/2 to irritability  RUE LUE  Shoulder Elevation    Shoulder Flexion    Shoulder  Extension    Shoulder Abduction     Shoulder Adduction     Shoulder ER     Shoulder IR     Elbow Extension    Elbow Flexion    Wrist Flexion     Wrist Extension     Thumb Extension    Lumbricals     REPEATED MOVEMENTS: No centralization or peripheralization of symptoms with repeated cervical retraction.    PASSIVE ACCESSORY INTERVERTEBRAL MOTION (PAIVM) Patient confirms reproduction of neck pain with supine CPA C3-C7 and UPA bilaterally C3-C7. Pain greatest at C3-5. Generally hypomobile throughout.  PALPATION: TTP throughout cervical paraspinals.   SPECIAL TESTS Alar Ligament testing: Negative ULTT Median: deferred 2/2 to irritability ULTT Ulnar: deferred 2/2 to irritability  ULTT Radial: deferred 2/2 to irritability Cervical Distraction: Positive  Flexion-rotation: R: Negative L: Positive Cervical Rotation < 60 degrees: R: Negative L: Positive Bakody's Sign: R: Negative L: Positive   ASSESSMENT Patient is a 40 year old presenting to clinic with chief complaints of L sided neck and shoulder pain with radiating numbness and tingling into the LUE. Examination today was limited 2/2 to patient fear and pain irritability. Upon examination, patient demonstrates deficits in cervical ROM, LUE ROM, posture, LUE function as evidenced by L cervical rotation limited to 20 degrees, cervical extension 32 degrees, L shoulder flexion 100 degrees, L shoulder abduction 115 degrees, L shoulder girdle resting ~10 cm lower than R, increased thoracic kyphosis and forward shoulders, + cervical distraction, + Bakody's sign, and + flexion-rotation. Patient's responses on FOTO outcome measures (46) indicate significant functional limitations/disability/distress. Patient's progress may be limited due to presence of fear-avoidance behaviors and comorbidties; however, patient's motivation is advantageous. Patient was  able to achieve mild relief with gentle cervical distraction during today's evaluation and responded positively to manual interventions. Patient will benefit from continued skilled therapeutic intervention to address deficits in cervical ROM, LUE ROM, posture, LUE function in order to increase function and improve overall QOL.  EDUCATION Patient educated on prognosis, POC, and provided with HEP including: cervical retraction and sleeping posture. Patient articulated understanding and returned demonstration. Patient will benefit from further education in order to maximize compliance and understanding for long-term therapeutic gains.      Objective measurements completed on examination: See above findings.          PT Long Term Goals - 02/26/21 1310      PT LONG TERM GOAL #1   Title Patient will be independent with HEP in order to decrease neck/shoulder pain and increase strength in order to improve pain-free function at home and work.    Baseline IE: not initiated    Time 4    Period Weeks    Status New  Target Date 03/26/21      PT LONG TERM GOAL #2   Title Patient will demonstrate improved function as evidenced by a score of 56 on FOTO measure for full participation in activities at home and in the community.    Baseline IE: 46    Time 4    Period Weeks    Status New    Target Date 03/26/21      PT LONG TERM GOAL #3   Title Patient will decrease worst neck/shoulder pain as reported on NPRS by at least 2 points in order to demonstrate clinically significant reduction in neck pain.    Baseline IE: 10/10    Time 4    Period Weeks    Status New    Target Date 03/26/21      PT LONG TERM GOAL #4   Title Patient will be able to perform all dressing and hygiene tasks with "little" to "no" difficulty for improved function and participation in ADLs.    Baseline IE: much difficulty    Time 4    Period Weeks    Status New    Target Date 03/26/21                   Plan - 02/26/21 0936    Clinical Impression Statement Patient is a 40 year old presenting to clinic with chief complaints of L sided neck and shoulder pain with radiating numbness and tingling into the LUE. Examination today was limited 2/2 to patient fear and pain irritability. Upon examination, patient demonstrates deficits in cervical ROM, LUE ROM, posture, LUE function as evidenced by L cervical rotation limited to 20 degrees, cervical extension 32 degrees, L shoulder flexion 100 degrees, L shoulder abduction 115 degrees, L shoulder girdle resting ~10 cm lower than R, increased thoracic kyphosis and forward shoulders, + cervical distraction, + Bakody's sign, and + flexion-rotation. Patient's responses on FOTO outcome measures (46) indicate significant functional limitations/disability/distress. Patient's progress may be limited due to presence of fear-avoidance behaviors and comorbidties; however, patient's motivation is advantageous. Patient was able to achieve mild relief with gentle cervical distraction during today's evaluation and responded positively to manual interventions. Patient will benefit from continued skilled therapeutic intervention to address deficits in cervical ROM, LUE ROM, posture, LUE function in order to increase function and improve overall QOL.    Personal Factors and Comorbidities Age;Behavior Pattern;Comorbidity 3+;Past/Current Experience;Time since onset of injury/illness/exacerbation    Comorbidities HTN, leukocytosis, migraine, GERD, panic disorder, schizoaffective disorder (depressive type),    Examination-Activity Limitations Bed Mobility;Bathing;Dressing;Hygiene/Grooming;Reach Overhead    Examination-Participation Restrictions Yard Work;Laundry;Cleaning;Meal Prep;Driving;Shop;Community Activity    Stability/Clinical Decision Making Evolving/Moderate complexity    Clinical Decision Making Moderate    Rehab Potential Fair    PT Frequency 2x / week    PT Duration 4 weeks     PT Treatment/Interventions Cryotherapy;Electrical Stimulation;Moist Heat;Therapeutic exercise;Neuromuscular re-education;Patient/family education;Manual techniques;Passive range of motion;Dry needling;Taping;Spinal Manipulations;Joint Manipulations    PT Next Visit Plan manual PRN, postural re-ed    PT Home Exercise Plan cervical retraction    Consulted and Agree with Plan of Care Patient           Patient will benefit from skilled therapeutic intervention in order to improve the following deficits and impairments:  Pain,Postural dysfunction,Improper body mechanics,Impaired UE functional use,Decreased strength,Decreased range of motion,Increased muscle spasms  Visit Diagnosis: Left shoulder pain, unspecified chronicity  Muscle weakness (generalized)     Problem List Patient Active Problem List   Diagnosis Date  Noted  . Left cervical radiculopathy 02/04/2021  . Supraspinatus tendinitis, left 02/04/2021  . Cervical paraspinal muscle spasm 02/04/2021  . Schizoaffective disorder, depressive type (HCC) 02/14/2020  . Rosacea 01/09/2020  . Panic disorder with agoraphobia 07/11/2019  . Pre-diabetes 01/26/2019  . Liver mass 01/25/2019  . Essential hypertension 07/15/2017  . Gastroesophageal reflux disease without esophagitis 12/18/2016  . History of kidney stones 06/04/2015  . Dysmenorrhea 06/04/2015  . Migraine without aura and without status migrainosus, not intractable 06/04/2015   Sheria LangKatlin Wania Longstreth PT, DPT [phone]  02/26/2021, 1:19 PM  [facility] [address] [place], [place], [id number] Phone: [phone]   Fax:  [phone]  Name: Amy Kusterngelee Nicole Gomez MRN: [id number] Date of Birth: 04/07/1981

## 2021-02-27 ENCOUNTER — Other Ambulatory Visit: Payer: Self-pay | Admitting: Family Medicine

## 2021-02-27 ENCOUNTER — Ambulatory Visit: Payer: Medicaid Other | Admitting: Physical Therapy

## 2021-02-27 ENCOUNTER — Telehealth: Payer: Self-pay

## 2021-02-27 DIAGNOSIS — M62838 Other muscle spasm: Secondary | ICD-10-CM

## 2021-02-27 MED ORDER — MELOXICAM 15 MG PO TABS
15.0000 mg | ORAL_TABLET | Freq: Every day | ORAL | 1 refills | Status: DC | PRN
Start: 2021-02-27 — End: 2021-04-02

## 2021-02-27 NOTE — Telephone Encounter (Signed)
PA initiated for diclofenac 75 mg via CoverMyMeds on 02/19/2021.  Coverage denied due to non-preferred by Medicaid.   Preferred medication meloxicam 15 mg daily sent to [facility].   Patient notified and verbalized understanding.  No further questions at this time.   For your information.

## 2021-03-01 ENCOUNTER — Other Ambulatory Visit: Payer: Self-pay | Admitting: Internal Medicine

## 2021-03-01 DIAGNOSIS — G43009 Migraine without aura, not intractable, without status migrainosus: Secondary | ICD-10-CM

## 2021-03-01 MED ORDER — TRAMADOL HCL 50 MG PO TABS: 50.0000 mg | ORAL_TABLET | Freq: Three times a day (TID) | ORAL | 0 refills | Status: DC | PRN

## 2021-03-01 NOTE — Telephone Encounter (Signed)
Refill if appropriate.  Please advise. Last filled 01/30/2021.

## 2021-03-04 ENCOUNTER — Ambulatory Visit: Payer: Medicaid Other | Admitting: Physical Therapy

## 2021-03-11 ENCOUNTER — Ambulatory Visit (INDEPENDENT_AMBULATORY_CARE_PROVIDER_SITE_OTHER): Payer: Medicaid Other | Admitting: Family Medicine

## 2021-03-11 ENCOUNTER — Ambulatory Visit: Payer: Medicaid Other | Attending: Family Medicine | Admitting: Physical Therapy

## 2021-03-11 ENCOUNTER — Encounter: Payer: Self-pay | Admitting: Family Medicine

## 2021-03-11 ENCOUNTER — Encounter: Payer: Self-pay | Admitting: Physical Therapy

## 2021-03-11 ENCOUNTER — Other Ambulatory Visit: Payer: Self-pay

## 2021-03-11 VITALS — BP 114/84 | HR 106 | Temp 98.6°F | Ht 61.0 in | Wt 180.0 lb

## 2021-03-11 DIAGNOSIS — M25512 Pain in left shoulder: Secondary | ICD-10-CM | POA: Insufficient documentation

## 2021-03-11 DIAGNOSIS — M5412 Radiculopathy, cervical region: Secondary | ICD-10-CM

## 2021-03-11 DIAGNOSIS — M7592 Shoulder lesion, unspecified, left shoulder: Secondary | ICD-10-CM | POA: Diagnosis not present

## 2021-03-11 DIAGNOSIS — M62838 Other muscle spasm: Secondary | ICD-10-CM | POA: Diagnosis not present

## 2021-03-11 DIAGNOSIS — M6281 Muscle weakness (generalized): Secondary | ICD-10-CM | POA: Diagnosis present

## 2021-03-11 NOTE — Assessment & Plan Note (Addendum)
Patient has attended 1 session of physical therapy, has been able to dose meloxicam on an as-needed basis endorsing 2-3 total doses.  Subjectively she states that she does have left greater than right paresthesias throughout the upper extremities primarily noted when laying in bed/nighttime.  On today's examination she now has full range of motion with mild pain during left rotation of her cervical spine, mildly tender in the left medial scapular border and left trapezius upper, Spurling's testing is now negative bilaterally.  Diclofenac was not initially covered and was prescribed meloxicam in place. The diclofenac was later approved but she did not pick up this Rx. I did review the appropriate dosing and indications for meloxicam and the possible transition to diclofenac if needed. Rx management reviewed. She is to continue with physical therapy at a frequency of twice per week for this stable chronic issue, dose meloxicam on an as-needed basis, and return for follow-up in 6 weeks.  I did recommend dosing meloxicam on days that she has physical therapy scheduled and afterwards as needed.

## 2021-03-11 NOTE — Assessment & Plan Note (Addendum)
Patient states that shoulder pain has improved greatly, still having roughly 1-2 times weekly episodes of shoulder/neck pain during ADLs (reaching outwards, pressure of bra strap on shoulder).  Denies any overt weakness and no new injuries.  On exam today she has full range of motion with mild pain during maximal overhead of the left upper extremity, isolated supraspinatus testing reveals 5/5 strength with mild pain, rotator cuff otherwise benign.  She has improved though still positive Neer's testing, Hawkins equivocal.  She is to continue physical therapy as discussed for this stable chronic issue, dose meloxicam as needed, and return in 6 weeks for reevaluation.  If suboptimal progress despite adherence to the above advised of the cervical spine or left shoulder, advanced imaging to be considered, local injections can be considered as clinically appropriate.

## 2021-03-11 NOTE — Patient Instructions (Signed)
-   Continue physical therapy 2 / week x 6 weeks - Perform home exercises daily - Use meloxicam as-needed (consider dosing on days of PT and after if needed) - Return in 6 weeks - Contact for questions

## 2021-03-11 NOTE — Therapy (Signed)
[facility] [address] [place], [place], [address] Phone: [phone]   Fax:  [phone]  Physical Therapy Treatment  Patient Details  Name: Amy Gomez MRN: [id number] Date of Birth: [birth year] Referring Provider (PT): Joseph Berkshire   Encounter Date: 03/11/2021   PT End of Session - 03/11/21 1636    Visit Number 2    Number of Visits 9    Date for PT Re-Evaluation 03/26/21    PT Start Time 0430    PT Stop Time 0510    PT Time Calculation (min) 40 min    Activity Tolerance Patient tolerated treatment well    Behavior During Therapy [facility] for tasks assessed/performed           Past Medical History:  Diagnosis Date  . Hypertension   . Kidney stones   . Leukocytosis 08/16/2018  . Migraine     Past Surgical History:  Procedure Laterality Date  . DILATION AND CURETTAGE OF UTERUS    . TUBAL LIGATION Bilateral 2007    There were no vitals filed for this visit.   Subjective Assessment - 03/11/21 1633    Subjective Patient notes that she is feeling much better but is still bothered by not being able to wear a bra without having sensations of "cut off circulation." Patient is still bothered in neck to an extent and ribs feel bruised. Patient has been able to mop without increased pain, but did have some mild soreness. Patient reports that she has had occasions where she slept on it wrong and the LUE will not want to work the rest of the day.    Currently in Pain? Yes    Pain Score 1     Pain Location Neck    Pain Orientation Left;Lower    Pain Onset More than a month ago          TREATMENT  Pre-treatment assessment: R lateral flexion L rotation of cervical spine at rest with B scapular elevation, R>L  Manual Therapy: STM and TPR performed to cervical paraspinals and L periscapular mm to allow for decreased tension and pain and improved posture and function Cervical PA and lateral mobilizations for decreased  spasm and improved mobility, grade II/III Neuromuscular Re-education: Cervical retraction, 50% effort, 5 sec x5 TCs for improved proprioception Scapular depression (in supine), 5 sec x5, TCs for decreased scapular protraction Combined cervical retraction with scapular depression for improved postural awareness, 5 sec x5  Post-treatment assessment: head position resting midline with decreased rotation of cervical spine  Patient educated throughout session on appropriate technique and form using multi-modal cueing, HEP, and activity modification. Patient articulated understanding and returned demonstration.  Patient Response to interventions: 1/10 pain  ASSESSMENT Patient presents to clinic with excellent motivation to participate in therapy. Patient demonstrates deficits in cervical ROM, LUE ROM, posture, LUE function. Patient able to achieve midline erect seated posture in thoracic and cervical spine with supine postural re-education during today's session and responded positively to manual interventions. Patient will benefit from continued skilled therapeutic intervention to address remaining deficits in cervical ROM, LUE ROM, posture, LUE function in order to increase function, and improve overall QOL.     PT Long Term Goals - 02/26/21 1310      PT LONG TERM GOAL #1   Title Patient will be independent with HEP in order to decrease neck/shoulder pain and increase strength in order to improve pain-free function at home and work.    Baseline  IE: not initiated    Time 4    Period Weeks    Status New    Target Date 03/26/21      PT LONG TERM GOAL #2   Title Patient will demonstrate improved function as evidenced by a score of 56 on FOTO measure for full participation in activities at home and in the community.    Baseline IE: 46    Time 4    Period Weeks    Status New    Target Date 03/26/21      PT LONG TERM GOAL #3   Title Patient will decrease worst neck/shoulder pain as reported  on NPRS by at least 2 points in order to demonstrate clinically significant reduction in neck pain.    Baseline IE: 10/10    Time 4    Period Weeks    Status New    Target Date 03/26/21      PT LONG TERM GOAL #4   Title Patient will be able to perform all dressing and hygiene tasks with "little" to "no" difficulty for improved function and participation in ADLs.    Baseline IE: much difficulty    Time 4    Period Weeks    Status New    Target Date 03/26/21                 Plan - 03/11/21 1637    Clinical Impression Statement Patient presents to clinic with excellent motivation to participate in therapy. Patient demonstrates deficits in cervical ROM, LUE ROM, posture, LUE function. Patient able to achieve midline erect seated posture in thoracic and cervical spine with supine postural re-education during today's session and responded positively to manual interventions. Patient will benefit from continued skilled therapeutic intervention to address remaining deficits in cervical ROM, LUE ROM, posture, LUE function in order to increase function, and improve overall QOL.    Personal Factors and Comorbidities Age;Behavior Pattern;Comorbidity 3+;Past/Current Experience;Time since onset of injury/illness/exacerbation    Comorbidities HTN, leukocytosis, migraine, GERD, panic disorder, schizoaffective disorder (depressive type),    Examination-Activity Limitations Bed Mobility;Bathing;Dressing;Hygiene/Grooming;Reach Overhead    Examination-Participation Restrictions Yard Work;Laundry;Cleaning;Meal Prep;Driving;Shop;Community Activity    Stability/Clinical Decision Making Evolving/Moderate complexity    Rehab Potential Fair    PT Frequency 2x / week    PT Duration 4 weeks    PT Treatment/Interventions Cryotherapy;Electrical Stimulation;Moist Heat;Therapeutic exercise;Neuromuscular re-education;Patient/family education;Manual techniques;Passive range of motion;Dry needling;Taping;Spinal  Manipulations;Joint Manipulations    PT Next Visit Plan manual PRN, postural re-ed    PT Home Exercise Plan cervical retraction    Consulted and Agree with Plan of Care Patient           Patient will benefit from skilled therapeutic intervention in order to improve the following deficits and impairments:  Pain,Postural dysfunction,Improper body mechanics,Impaired UE functional use,Decreased strength,Decreased range of motion,Increased muscle spasms  Visit Diagnosis: Left shoulder pain, unspecified chronicity  Muscle weakness (generalized)     Problem List Patient Active Problem List   Diagnosis Date Noted  . Left cervical radiculopathy 02/04/2021  . Supraspinatus tendinitis, left 02/04/2021  . Cervical paraspinal muscle spasm 02/04/2021  . Schizoaffective disorder, depressive type (HCC) 02/14/2020  . Rosacea 01/09/2020  . Panic disorder with agoraphobia 07/11/2019  . Pre-diabetes 01/26/2019  . Liver mass 01/25/2019  . Essential hypertension 07/15/2017  . Gastroesophageal reflux disease without esophagitis 12/18/2016  . History of kidney stones 06/04/2015  . Dysmenorrhea 06/04/2015  . Migraine without aura and without status migrainosus, not intractable 06/04/2015  Sheria Lang PT, DPT [phone]  03/11/2021, 5:37 PM  [facility] [address] [place], [place], [address] Phone: [phone]   Fax:  [phone]  Name: Amy Gomez MRN: [id number] Date of Birth: 02-16-1981

## 2021-03-11 NOTE — Progress Notes (Signed)
Primary Care / Sports Medicine Office Visit  Patient Information:  Patient ID: Amy Gomez, female DOB: 07/14/1981 Age: 40 y.o. MRN: [id number]   Amy Gomez is a pleasant 40 y.o. female presenting with the following:  Chief Complaint  Patient presents with  . Follow-up  . Left cervical radiculopathy    Pain is better per patient; PT has been helpful for patient, has appointment this afternoon with PT also; has taken meloxicam and baclofen as needed; right-handedness; 1/10 pain in office    Review of Systems pertinent details above   Patient Active Problem List   Diagnosis Date Noted  . Left cervical radiculopathy 02/04/2021  . Supraspinatus tendinitis, left 02/04/2021  . Cervical paraspinal muscle spasm 02/04/2021  . Schizoaffective disorder, depressive type (HCC) 02/14/2020  . Rosacea 01/09/2020  . Panic disorder with agoraphobia 07/11/2019  . Pre-diabetes 01/26/2019  . Liver mass 01/25/2019  . Essential hypertension 07/15/2017  . Gastroesophageal reflux disease without esophagitis 12/18/2016  . History of kidney stones 06/04/2015  . Dysmenorrhea 06/04/2015  . Migraine without aura and without status migrainosus, not intractable 06/04/2015   Past Medical History:  Diagnosis Date  . Hypertension   . Kidney stones   . Leukocytosis 08/16/2018  . Migraine    Outpatient Medications Prior to Visit  Medication Sig Dispense Refill  . baclofen (LIORESAL) 20 MG tablet Take 1 tablet (20 mg total) by mouth at bedtime as needed for muscle spasms. 30 each 0  . lisinopril (ZESTRIL) 20 MG tablet Take 1 tablet (20 mg total) by mouth daily. 90 tablet 1  . meloxicam (MOBIC) 15 MG tablet Take 1 tablet (15 mg total) by mouth daily as needed for pain. 30 tablet 1  . metoprolol succinate (TOPROL-XL) 50 MG 24 hr tablet Take 1 tablet (50 mg total) by mouth daily. Take with or immediately following a meal. 90 tablet 1  . ondansetron (ZOFRAN) 4 MG tablet Take 1 tablet (4 mg  total) by mouth every 8 (eight) hours as needed for nausea or vomiting. 20 tablet 0  . oxyCODONE (OXY IR/ROXICODONE) 5 MG immediate release tablet Take 5 mg by mouth every 4 (four) hours as needed.    . topiramate (TOPAMAX) 25 MG tablet Take 1 tablet (25 mg total) by mouth at bedtime. 90 tablet 1  . traMADol (ULTRAM) 50 MG tablet Take 1 tablet (50 mg total) by mouth every 8 (eight) hours as needed (migraine HA). 20 tablet 0   No facility-administered medications prior to visit.    Past Surgical History:  Procedure Laterality Date  . DILATION AND CURETTAGE OF UTERUS    . TUBAL LIGATION Bilateral 2007   Social History   Socioeconomic History  . Marital status: Married    Spouse name: Amy Gomez  . Number of children: Not on file  . Years of education: Not on file  . Highest education level: Not on file  Occupational History  . Not on file  Tobacco Use  . Smoking status: Current Every Day Smoker    Packs/day: 0.50    Years: 24.00    Pack years: 12.00    Types: Cigarettes    Start date: 49  . Smokeless tobacco: Never Used  Vaping Use  . Vaping Use: Never used  Substance and Sexual Activity  . Alcohol use: No    Alcohol/week: 0.0 standard drinks  . Drug use: No  . Sexual activity: Yes    Partners: Male  Other Topics Concern  .  Not on file  Social History Narrative  . Not on file   Social Determinants of Health   Financial Resource Strain: Not on file  Food Insecurity: Not on file  Transportation Needs: Not on file  Physical Activity: Not on file  Stress: Not on file  Social Connections: Not on file  Intimate Partner Violence: Not on file   Family History  Problem Relation Age of Onset  . Other Mother        unknown medical history  . Other Father        unknown medical hisotry  . Endometriosis Maternal Grandmother    Allergies  Allergen Reactions  . Sulfa Antibiotics Anaphylaxis, Swelling, Rash and Other (See Comments)    Vitals:   03/11/21 1023  BP:  114/84  Pulse: (!) 106  Temp: 98.6 F (37 C)  SpO2: 97%   Vitals:   03/11/21 1023  Weight: 180 lb (81.6 kg)  Height: 5\' 1"  (1.549 m)   Body mass index is 34.01 kg/m.  No results found.   Independent interpretation of notes and tests performed by another provider:   None  Procedures performed:   None  Pertinent History, Exam, Impression, and Recommendations:   Left cervical radiculopathy Patient has attended 1 session of physical therapy, has been able to dose meloxicam on an as-needed basis endorsing 2-3 total doses.  Subjectively she states that she does have left greater than right paresthesias throughout the upper extremities primarily noted when laying in bed/nighttime.  On today's examination she now has full range of motion with mild pain during left rotation of her cervical spine, mildly tender in the left medial scapular border and left trapezius upper, Spurling's testing is now negative bilaterally.  She is to continue with physical therapy at a frequency of twice per week for this stable chronic issue, dose meloxicam on an as-needed basis, and return for follow-up in 6 weeks.  I did recommend dosing meloxicam on days that she has physical therapy scheduled and afterwards as needed.  Supraspinatus tendinitis, left Patient states that shoulder pain has improved greatly, still having roughly 1-2 times weekly episodes of shoulder/neck pain during ADLs (reaching outwards, pressure of bra strap on shoulder).  Denies any overt weakness and no new injuries.  On exam today she has full range of motion with mild pain during maximal overhead of the left upper extremity, isolated supraspinatus testing reveals 5/5 strength with mild pain, rotator cuff otherwise benign.  She has improved though still positive Neer's testing, Hawkins equivocal.  She is to continue physical therapy as discussed for this stable chronic issue, dose meloxicam as needed, and return in 6 weeks for  reevaluation.  If suboptimal progress despite adherence to the above advised of the cervical spine or left shoulder, advanced imaging to be considered, local injections can be considered as clinically appropriate.    Orders & Medications No orders of the defined types were placed in this encounter.  No orders of the defined types were placed in this encounter.    Return in about 6 weeks (around 04/22/2021).     04/24/2021, MD   Primary Care Sports Medicine [facility]

## 2021-03-13 ENCOUNTER — Encounter: Payer: Self-pay | Admitting: Physical Therapy

## 2021-03-13 ENCOUNTER — Ambulatory Visit: Payer: Medicaid Other | Admitting: Physical Therapy

## 2021-03-13 ENCOUNTER — Other Ambulatory Visit: Payer: Self-pay

## 2021-03-13 DIAGNOSIS — M25512 Pain in left shoulder: Secondary | ICD-10-CM

## 2021-03-13 DIAGNOSIS — M6281 Muscle weakness (generalized): Secondary | ICD-10-CM

## 2021-03-13 NOTE — Therapy (Signed)
[facility] [address] [place], [place], [address] Phone: [phone]   Fax:  [phone]  Physical Therapy Treatment  Patient Details  Name: Amy Gomez MRN: [id number] Date of Birth: 15-Oct-1981 Referring Provider (PT): Joseph Berkshire   Encounter Date: 03/13/2021   PT End of Session - 03/13/21 1145    Visit Number 3    Number of Visits 9    Date for PT Re-Evaluation 03/26/21    PT Start Time 1145    PT Stop Time 1225    PT Time Calculation (min) 40 min    Activity Tolerance Patient tolerated treatment well    Behavior During Therapy [facility] for tasks assessed/performed           Past Medical History:  Diagnosis Date  . Hypertension   . Kidney stones   . Leukocytosis 08/16/2018  . Migraine     Past Surgical History:  Procedure Laterality Date  . DILATION AND CURETTAGE OF UTERUS    . TUBAL LIGATION Bilateral 2007    There were no vitals filed for this visit.   Subjective Assessment - 03/13/21 1146    Subjective Patient presents to clinic with a bra and notes that she is not in much pain with wearing it. She does note that she has some low level pins and needles right now. Patient also reports that last night she had a low pop in her elbow when pushing off the couch to stand up which sent shocks and electricity up her arm to her neck. Patient states that it feels fine now, but the pain at the time was quite intense. No loss of ROM since occurrence and no pain with active elbow ROM.    Currently in Pain? No/denies    Pain Onset More than a month ago           TREATMENT  Manual Therapy: STM and TPR performed to cervical paraspinals and L periscapular mm to allow for decreased tension and pain and improved posture and function Cervical PA and lateral mobilizations for decreased spasm and improved mobility, grade II/III Inferior L GH mobilizations, grade I/II, for improved posture and decreased muscle spasm, not  well tolerated  Neuromuscular Re-education: Graded exposure to movement including: PROM shoulder flexion and abduction (not well tolerated) AAROM shoulder flexion and abduction (better tolerance) Doorway stretch with arms below 30 degrees, 15 sec bouts Pain neuroscience education for improved understanding of pain and nervous system downregulation.  Patient educated throughout session on appropriate technique and form using multi-modal cueing, HEP, and activity modification. Patient articulated understanding and returned demonstration.  Patient Response to interventions: 2/10 pain  ASSESSMENT Patient presents to clinic with excellent motivation to participate in therapy. Patient demonstrates deficits in cervical ROM, LUE ROM, posture, LUE function. Patient with poor tolerance to manual interventions and high pain response to any novel stimulus during today's session. Patient continues to display fear avoidance with direct intervention at the site but fortunately is able to perform chores at home with decreasing pain frequency. Patient will benefit from continued skilled therapeutic intervention to address remaining deficits in cervical ROM, LUE ROM, posture, LUE function in order to increase function, and improve overall QOL.      PT Long Term Goals - 03/13/21 1150      PT LONG TERM GOAL #1   Title Patient will be independent with HEP in order to decrease neck/shoulder pain and increase strength in order to improve pain-free function at home  and work.    Baseline IE: not initiated; 5/4: 75% consistent    Time 4    Period Weeks    Status On-going    Target Date 03/26/21      PT LONG TERM GOAL #2   Title Patient will demonstrate improved function as evidenced by a score of 56 on FOTO measure for full participation in activities at home and in the community.    Baseline IE: 46; 5/4: 41    Time 4    Period Weeks    Status On-going    Target Date 03/26/21      PT LONG TERM GOAL #3    Title Patient will decrease worst neck/shoulder pain as reported on NPRS by at least 2 points in order to demonstrate clinically significant reduction in neck pain.    Baseline IE: 10/10; 5/4: 9/10 (depending on activity) is responsive to pain medication and rest, less frequent 9/10 pain    Time 4    Period Weeks    Status On-going    Target Date 03/26/21      PT LONG TERM GOAL #4   Title Patient will be able to perform all dressing and hygiene tasks with "little" to "no" difficulty for improved function and participation in ADLs.    Baseline IE: much difficulty; 5/4: some difficulty    Time 4    Period Weeks    Status On-going    Target Date 03/26/21                 Plan - 03/13/21 1145    Clinical Impression Statement Patient presents to clinic with excellent motivation to participate in therapy. Patient demonstrates deficits in cervical ROM, LUE ROM, posture, LUE function. Patient with poor tolerance to manual interventions and high pain response to any novel stimulus during today's session. Patient continues to display fear avoidance with direct intervention at the site but fortunately is able to perform chores at home with decreasing pain frequency. Patient will benefit from continued skilled therapeutic intervention to address remaining deficits in cervical ROM, LUE ROM, posture, LUE function in order to increase function, and improve overall QOL.    Personal Factors and Comorbidities Age;Behavior Pattern;Comorbidity 3+;Past/Current Experience;Time since onset of injury/illness/exacerbation    Comorbidities HTN, leukocytosis, migraine, GERD, panic disorder, schizoaffective disorder (depressive type),    Examination-Activity Limitations Bed Mobility;Bathing;Dressing;Hygiene/Grooming;Reach Overhead    Examination-Participation Restrictions Yard Work;Laundry;Cleaning;Meal Prep;Driving;Shop;Community Activity    Stability/Clinical Decision Making Evolving/Moderate complexity    Rehab  Potential Fair    PT Frequency 2x / week    PT Duration 4 weeks    PT Treatment/Interventions Cryotherapy;Electrical Stimulation;Moist Heat;Therapeutic exercise;Neuromuscular re-education;Patient/family education;Manual techniques;Passive range of motion;Dry needling;Taping;Spinal Manipulations;Joint Manipulations    PT Next Visit Plan manual PRN, postural re-ed    PT Home Exercise Plan cervical retraction    Consulted and Agree with Plan of Care Patient           Patient will benefit from skilled therapeutic intervention in order to improve the following deficits and impairments:  Pain,Postural dysfunction,Improper body mechanics,Impaired UE functional use,Decreased strength,Decreased range of motion,Increased muscle spasms  Visit Diagnosis: Left shoulder pain, unspecified chronicity  Muscle weakness (generalized)     Problem List Patient Active Problem List   Diagnosis Date Noted  . Left cervical radiculopathy 02/04/2021  . Supraspinatus tendinitis, left 02/04/2021  . Cervical paraspinal muscle spasm 02/04/2021  . Schizoaffective disorder, depressive type (HCC) 02/14/2020  . Rosacea 01/09/2020  . Panic disorder with agoraphobia 07/11/2019  .  Pre-diabetes 01/26/2019  . Liver mass 01/25/2019  . Essential hypertension 07/15/2017  . Gastroesophageal reflux disease without esophagitis 12/18/2016  . History of kidney stones 06/04/2015  . Dysmenorrhea 06/04/2015  . Migraine without aura and without status migrainosus, not intractable 06/04/2015   Sheria Lang PT, DPT [phone]  03/13/2021, 1:21 PM  [facility] [address] [place], [place], [address] Phone: [phone]   Fax:  [phone]  Name: Amy Gomez MRN: [id number] Date of Birth: [birth year]

## 2021-03-18 ENCOUNTER — Ambulatory Visit: Payer: Medicaid Other | Admitting: Physical Therapy

## 2021-03-20 ENCOUNTER — Ambulatory Visit: Payer: Medicaid Other | Admitting: Physical Therapy

## 2021-03-27 ENCOUNTER — Encounter: Payer: Self-pay | Admitting: Internal Medicine

## 2021-04-01 ENCOUNTER — Ambulatory Visit: Payer: Medicaid Other | Admitting: Physical Therapy

## 2021-04-02 ENCOUNTER — Other Ambulatory Visit: Payer: Self-pay | Admitting: Internal Medicine

## 2021-04-02 ENCOUNTER — Telehealth: Payer: Self-pay

## 2021-04-02 DIAGNOSIS — G43009 Migraine without aura, not intractable, without status migrainosus: Secondary | ICD-10-CM

## 2021-04-02 MED ORDER — TRAMADOL HCL 50 MG PO TABS: 50.0000 mg | ORAL_TABLET | Freq: Three times a day (TID) | ORAL | 0 refills | Status: DC | PRN

## 2021-04-02 NOTE — Telephone Encounter (Signed)
Copied from CRM [id number]. Topic: Quick Communication - Rx Refill/Question >> Apr 02, 2021  2:32 PM Aretta Nip wrote: Medication: traMADol (ULTRAM) 50 MG tablet 20 tablet 0 03/01/2021   Sig - Route: Take 1 tablet (50 mg total) by mouth every 8 (eight) hours as needed (migraine HA). - Oral  Sent to pharmacy as: traMADol (ULTRAM) 50 MG tablet  E-Prescribing Status: Receipt confirmed by pharmacy (4     Has the patient contacted their pharmacy? y (Agent: If no, request that the patient contact the pharmacy for the refill.) (Agent: If yes, when and what did the pharmacy advise?) call dr  Preferred Pharmacy (with phone number or street name): [facility] [address], [place] - [address] [place] [address] Phone: [phone] Fax: [phone]    Agent: Please be advised that RX refills may take up to 3 business days. We ask that you follow-up with your pharmacy.

## 2021-04-03 ENCOUNTER — Ambulatory Visit: Payer: Medicaid Other | Admitting: Physical Therapy

## 2021-04-10 ENCOUNTER — Encounter: Payer: Self-pay | Admitting: Family Medicine

## 2021-04-10 ENCOUNTER — Encounter: Payer: Medicaid Other | Admitting: Physical Therapy

## 2021-04-15 ENCOUNTER — Encounter: Payer: Medicaid Other | Admitting: Physical Therapy

## 2021-04-17 ENCOUNTER — Encounter: Payer: Medicaid Other | Admitting: Physical Therapy

## 2021-04-22 ENCOUNTER — Ambulatory Visit: Payer: Medicaid Other | Admitting: Family Medicine

## 2021-04-22 ENCOUNTER — Encounter: Payer: Medicaid Other | Admitting: Physical Therapy

## 2021-04-23 ENCOUNTER — Encounter: Payer: Medicaid Other | Admitting: Obstetrics and Gynecology

## 2021-04-23 ENCOUNTER — Other Ambulatory Visit: Payer: Self-pay

## 2021-04-24 ENCOUNTER — Encounter: Payer: Self-pay | Admitting: Obstetrics and Gynecology

## 2021-04-24 ENCOUNTER — Other Ambulatory Visit: Payer: Self-pay

## 2021-04-24 ENCOUNTER — Ambulatory Visit (INDEPENDENT_AMBULATORY_CARE_PROVIDER_SITE_OTHER): Payer: Medicaid Other | Admitting: Obstetrics and Gynecology

## 2021-04-24 ENCOUNTER — Encounter: Payer: Medicaid Other | Admitting: Physical Therapy

## 2021-04-24 VITALS — BP 143/93 | HR 85 | Ht 61.0 in | Wt 186.3 lb

## 2021-04-24 DIAGNOSIS — Z8742 Personal history of other diseases of the female genital tract: Secondary | ICD-10-CM | POA: Diagnosis not present

## 2021-04-24 DIAGNOSIS — N649 Disorder of breast, unspecified: Secondary | ICD-10-CM

## 2021-04-24 DIAGNOSIS — N914 Secondary oligomenorrhea: Secondary | ICD-10-CM | POA: Diagnosis not present

## 2021-04-24 NOTE — Progress Notes (Signed)
HPI:      Amy Gomez is a 40 y.o. No obstetric history on file. who LMP was No LMP recorded.  Subjective:   She presents today with multiple problems to discuss.  She states that she has a breast "cyst" that is on her skin and has become scaly.  She states that has been present for more than 6 months.  She says it has not resolved at all during this time.  She reports no nipple discharge.  She reports no trauma to this area of the breast. She also states that her menstrual periods are very irregular.  Approximately 3 times a year she has 7 to 8-day menses which are extremely heavy with clots.  The remainder of the months she says that she has typically a 1 day menses.  She does report a distant history of PCO. She has a tubal ligation for birth control. Patient does smoke cigarettes but she says that she is currently actively trying to quit.    Hx: The following portions of the patient's history were reviewed and updated as appropriate:             She  has a past medical history of Hypertension, Kidney stones, Leukocytosis (08/16/2018), and Migraine. She does not have any pertinent problems on file. She  has a past surgical history that includes Tubal ligation (Bilateral, 2007) and Dilation and curettage of uterus. Her family history includes Endometriosis in her maternal grandmother; Other in her father and mother. She  reports that she has been smoking cigarettes. She started smoking about 26 years ago. She has a 12.00 pack-year smoking history. She has never used smokeless tobacco. She reports that she does not drink alcohol and does not use drugs. She has a current medication list which includes the following prescription(s): lisinopril, metoprolol succinate, tramadol, baclofen, diclofenac, ondansetron, oxycodone, topiramate, [DISCONTINUED] diphenhydramine hcl, and [DISCONTINUED] omeprazole. She is allergic to sulfa antibiotics.       Review of Systems:  Review of  Systems  Constitutional: Denied constitutional symptoms, night sweats, recent illness, fatigue, fever, insomnia and weight loss.  Eyes: Denied eye symptoms, eye pain, photophobia, vision change and visual disturbance.  Ears/Nose/Throat/Neck: Denied ear, nose, throat or neck symptoms, hearing loss, nasal discharge, sinus congestion and sore throat.  Cardiovascular: Denied cardiovascular symptoms, arrhythmia, chest pain/pressure, edema, exercise intolerance, orthopnea and palpitations.  Respiratory: Denied pulmonary symptoms, asthma, pleuritic pain, productive sputum, cough, dyspnea and wheezing.  Gastrointestinal: Denied, gastro-esophageal reflux, melena, nausea and vomiting.  Genitourinary: See HPI for additional information.  Musculoskeletal: Denied musculoskeletal symptoms, stiffness, swelling, muscle weakness and myalgia.  Dermatologic: Denied dermatology symptoms, rash and scar.  Neurologic: Denied neurology symptoms, dizziness, headache, neck pain and syncope.  Psychiatric: Denied psychiatric symptoms, anxiety and depression.  Endocrine: Denied endocrine symptoms including hot flashes and night sweats.   Meds:   Current Outpatient Medications on File Prior to Visit  Medication Sig Dispense Refill   lisinopril (ZESTRIL) 20 MG tablet Take 1 tablet (20 mg total) by mouth daily. 90 tablet 1   metoprolol succinate (TOPROL-XL) 50 MG 24 hr tablet Take 1 tablet (50 mg total) by mouth daily. Take with or immediately following a meal. 90 tablet 1   traMADol (ULTRAM) 50 MG tablet Take 1 tablet (50 mg total) by mouth every 8 (eight) hours as needed (migraine HA). 20 tablet 0   baclofen (LIORESAL) 20 MG tablet Take 1 tablet (20 mg total) by mouth at bedtime as needed for muscle spasms. (  Patient not taking: Reported on 04/24/2021) 30 each 0   diclofenac (VOLTAREN) 75 MG EC tablet Take 75 mg by mouth 2 (two) times daily as needed. (Patient not taking: Reported on 04/24/2021)     ondansetron (ZOFRAN) 4 MG  tablet Take 1 tablet (4 mg total) by mouth every 8 (eight) hours as needed for nausea or vomiting. (Patient not taking: Reported on 04/24/2021) 20 tablet 0   oxyCODONE (OXY IR/ROXICODONE) 5 MG immediate release tablet Take 5 mg by mouth every 4 (four) hours as needed. (Patient not taking: Reported on 04/24/2021)     topiramate (TOPAMAX) 25 MG tablet Take 1 tablet (25 mg total) by mouth at bedtime. (Patient not taking: Reported on 04/24/2021) 90 tablet 1   [DISCONTINUED] diphenhydrAMINE HCl (BENADRYL PO) Take by mouth as needed.     [DISCONTINUED] omeprazole (PRILOSEC) 10 MG capsule Take 1 capsule (10 mg total) by mouth daily. (Patient not taking: No sig reported) 30 capsule 0   No current facility-administered medications on file prior to visit.        The pregnancy intention screening data noted above was reviewed. Potential methods of contraception were discussed. The patient elected to proceed with Female Sterilization.     Objective:     Vitals:   04/24/21 1111  BP: (!) 143/93  Pulse: 85   Filed Weights   04/24/21 1111  Weight: 186 lb 4.8 oz (84.5 kg)              Examination of the right breast reveals a 1 cm scaly erythematous lesion.  There is no fullness below it.  No axillary adenopathy is found no other breast masses noted.  There is no nipple discharge  Assessment:    No obstetric history on file. Patient Active Problem List   Diagnosis Date Noted   Left cervical radiculopathy 02/04/2021   Supraspinatus tendinitis, left 02/04/2021   Cervical paraspinal muscle spasm 02/04/2021   Schizoaffective disorder, depressive type (HCC) 02/14/2020   Rosacea 01/09/2020   Panic disorder with agoraphobia 07/11/2019   Pre-diabetes 01/26/2019   Liver mass 01/25/2019   Essential hypertension 07/15/2017   Gastroesophageal reflux disease without esophagitis 12/18/2016   History of kidney stones 06/04/2015   Dysmenorrhea 06/04/2015   Migraine without aura and without status  migrainosus, not intractable 06/04/2015     1. Secondary oligomenorrhea   2. History of PCOS   3. Breast lesion     Superficial skin lesion of the right breast?   Plan:            1.  PCO labs  2.  Refer to general surgery for evaluation of 22-month breast lesion  3.  Patient to return to discuss PCO labs and have her annual examination with Pap smear in the near future. Orders Orders Placed This Encounter  Procedures   DHEA-sulfate   FSH/LH   Glucose, fasting   Insulin, random   Prolactin   Testosterone, Free, Total, SHBG   TSH   Ambulatory referral to General Surgery    No orders of the defined types were placed in this encounter.     F/U  Return in about 3 weeks (around 05/15/2021) for Annual Physical. I spent 32 minutes involved in the care of this patient preparing to see the patient by obtaining and reviewing her medical history (including labs, imaging tests and prior procedures), documenting clinical information in the electronic health record (EHR), counseling and coordinating care plans, writing and sending prescriptions, ordering tests or procedures  and directly communicating with the patient by discussing pertinent items from her history and physical exam as well as detailing my assessment and plan as noted above so that she has an informed understanding.  All of her questions were answered.  Elonda Husky, M.D. 04/24/2021 11:58 AM

## 2021-04-25 ENCOUNTER — Other Ambulatory Visit: Payer: Self-pay | Admitting: Internal Medicine

## 2021-04-25 DIAGNOSIS — G43009 Migraine without aura, not intractable, without status migrainosus: Secondary | ICD-10-CM

## 2021-04-26 LAB — TESTOSTERONE, FREE, TOTAL, SHBG
Sex Hormone Binding: 31.2 nmol/L (ref 24.6–122.0)
Testosterone, Free: 2.3 pg/mL (ref 0.0–4.2)
Testosterone: 26 ng/dL (ref 8–60)

## 2021-04-26 LAB — GLUCOSE, FASTING: Glucose, Plasma: 67 mg/dL (ref 65–99)

## 2021-04-26 LAB — DHEA-SULFATE: DHEA-SO4: 255 ug/dL (ref 57.3–279.2)

## 2021-04-26 LAB — INSULIN, RANDOM: INSULIN: 17.3 u[IU]/mL (ref 2.6–24.9)

## 2021-04-26 LAB — PROLACTIN: Prolactin: 11.8 ng/mL (ref 4.8–23.3)

## 2021-04-26 LAB — TSH: TSH: 0.954 u[IU]/mL (ref 0.450–4.500)

## 2021-04-26 LAB — FSH/LH
FSH: 6.5 m[IU]/mL
LH: 3.5 m[IU]/mL

## 2021-04-29 ENCOUNTER — Other Ambulatory Visit: Payer: Self-pay | Admitting: Internal Medicine

## 2021-04-29 DIAGNOSIS — G43009 Migraine without aura, not intractable, without status migrainosus: Secondary | ICD-10-CM

## 2021-04-30 ENCOUNTER — Encounter: Payer: Self-pay | Admitting: Internal Medicine

## 2021-04-30 MED ORDER — TRAMADOL HCL 50 MG PO TABS: 50.0000 mg | ORAL_TABLET | Freq: Three times a day (TID) | ORAL | 2 refills | Status: DC | PRN

## 2021-04-30 NOTE — Telephone Encounter (Signed)
Please review. Last office visit 01/30/2021.  KP

## 2021-05-01 ENCOUNTER — Other Ambulatory Visit: Payer: Self-pay | Admitting: Internal Medicine

## 2021-05-01 DIAGNOSIS — G43009 Migraine without aura, not intractable, without status migrainosus: Secondary | ICD-10-CM

## 2021-05-01 MED ORDER — NARATRIPTAN HCL 2.5 MG PO TABS: 2.5000 mg | ORAL_TABLET | ORAL | 0 refills | Status: DC | PRN

## 2021-05-01 NOTE — Telephone Encounter (Signed)
Pt response.  KP

## 2021-05-01 NOTE — Telephone Encounter (Signed)
Patient has a history of taking naratriptan 2.5 mg for migraine relief.  Please advise.

## 2021-05-02 ENCOUNTER — Encounter: Payer: Self-pay | Admitting: Family Medicine

## 2021-05-02 ENCOUNTER — Ambulatory Visit: Payer: Medicaid Other | Admitting: Family Medicine

## 2021-05-16 ENCOUNTER — Encounter: Payer: Medicaid Other | Admitting: Obstetrics and Gynecology

## 2021-05-20 ENCOUNTER — Other Ambulatory Visit: Payer: Self-pay

## 2021-05-20 ENCOUNTER — Ambulatory Visit: Payer: Medicaid Other | Admitting: Internal Medicine

## 2021-05-20 ENCOUNTER — Encounter: Payer: Self-pay | Admitting: Internal Medicine

## 2021-05-20 VITALS — BP 140/72 | HR 108 | Ht 61.0 in | Wt 184.0 lb

## 2021-05-20 DIAGNOSIS — R10811 Right upper quadrant abdominal tenderness: Secondary | ICD-10-CM | POA: Diagnosis not present

## 2021-05-20 DIAGNOSIS — G43009 Migraine without aura, not intractable, without status migrainosus: Secondary | ICD-10-CM

## 2021-05-20 NOTE — Progress Notes (Signed)
Date:  05/20/2021   Name:  Amy Gomez   DOB:  09/25/1981   MRN:  [id number]   Chief Complaint: Flank Pain (Tried to donate blood or platelets. Had a exam before donating and was told she was having upper right flank pain and needs to be checked for gallbladder or liver issues. Weight gain, and acid reflux. ) and Migraine (Asking for RF on tramadol for migraines. Having them more often due to having no air at home, and heat in home. )  Abdominal Pain This is a recurrent problem. The current episode started more than 1 year ago. The onset quality is gradual. The problem occurs intermittently. The problem has been unchanged. The pain is located in the RUQ. The quality of the pain is aching. The abdominal pain does not radiate. Associated symptoms include headaches. Pertinent negatives include no fever. There is no history of abdominal surgery or gallstones. renal stones  Migraine  This is a chronic problem. The current episode started more than 1 year ago. The problem occurs daily. The problem has been gradually worsening. The pain does not radiate. The pain quality is similar to prior headaches. Associated symptoms include abdominal pain. Pertinent negatives include no coughing, dizziness or fever.   Lab Results  Component Value Date   CREATININE 0.60 12/17/2020   BUN 12 12/17/2020   NA 137 12/17/2020   K 3.9 12/17/2020   CL 105 12/17/2020   CO2 21 (L) 12/17/2020   No results found for: CHOL, HDL, LDLCALC, LDLDIRECT, TRIG, CHOLHDL Lab Results  Component Value Date   TSH 0.954 04/24/2021   Lab Results  Component Value Date   HGBA1C 5.8 (H) 01/26/2019   Lab Results  Component Value Date   WBC 13.2 (H) 12/17/2020   HGB 13.5 12/17/2020   HCT 40.1 12/17/2020   MCV 83.9 12/17/2020   PLT 277 12/17/2020   Lab Results  Component Value Date   ALT 17 12/17/2020   AST 17 12/17/2020   ALKPHOS 69 12/17/2020   BILITOT 0.6 12/17/2020     Review of Systems  Constitutional:   Negative for chills, fatigue and fever.  Respiratory:  Negative for cough, chest tightness, shortness of breath and wheezing.   Cardiovascular:  Negative for chest pain, palpitations and leg swelling.  Gastrointestinal:  Positive for abdominal pain.  Neurological:  Positive for headaches. Negative for dizziness and light-headedness.  Psychiatric/Behavioral:  Negative for dysphoric mood and sleep disturbance. The patient is not nervous/anxious.    Patient Active Problem List   Diagnosis Date Noted   Left cervical radiculopathy 02/04/2021   Supraspinatus tendinitis, left 02/04/2021   Cervical paraspinal muscle spasm 02/04/2021   Schizoaffective disorder, depressive type (HCC) 02/14/2020   Rosacea 01/09/2020   Panic disorder with agoraphobia 07/11/2019   Pre-diabetes 01/26/2019   Liver mass 01/25/2019   Essential hypertension 07/15/2017   Gastroesophageal reflux disease without esophagitis 12/18/2016   History of kidney stones 06/04/2015   Dysmenorrhea 06/04/2015   Migraine without aura and without status migrainosus, not intractable 06/04/2015    Allergies  Allergen Reactions   Sulfa Antibiotics Anaphylaxis, Swelling, Rash and Other (See Comments)   Azithromycin Rash    Rash as a teenager, "Z Pack"    Past Surgical History:  Procedure Laterality Date   DILATION AND CURETTAGE OF UTERUS     TUBAL LIGATION Bilateral 2007    Social History   Tobacco Use   Smoking status: Every Day    Packs/day: 0.50  Years: 24.00    Pack years: 12.00    Types: Cigarettes    Start date: 1996   Smokeless tobacco: Never  Vaping Use   Vaping Use: Never used  Substance Use Topics   Alcohol use: No    Alcohol/week: 0.0 standard drinks   Drug use: No     Medication list has been reviewed and updated.  Current Meds  Medication Sig   lisinopril (ZESTRIL) 20 MG tablet Take 1 tablet (20 mg total) by mouth daily.   naratriptan (AMERGE) 2.5 MG tablet Take 1 tablet (2.5 mg total) by mouth  as needed for migraine. Take one (1) tablet at onset of headache; if returns or does not resolve, may repeat after 4 hours; do not exceed five (5) mg in 24 hours.   traMADol (ULTRAM) 50 MG tablet Take 1 tablet (50 mg total) by mouth every 8 (eight) hours as needed (migraine HA).    PHQ 2/9 Scores 05/20/2021 03/11/2021 02/04/2021 01/30/2021  PHQ - 2 Score 1 0 0 0  PHQ- 9 Score 8 0 0 0    GAD 7 : Generalized Anxiety Score 03/11/2021 02/04/2021 01/30/2021 12/17/2020  Nervous, Anxious, on Edge 3 1 1 1   Control/stop worrying 3 2 2  0  Worry too much - different things 2 2 2  0  Trouble relaxing 0 1 1 0  Restless 0 1 1 0  Easily annoyed or irritable 1 1 1 3   Afraid - awful might happen 0 1 1 0  Total GAD 7 Score 9 9 9 4   Anxiety Difficulty Not difficult at all Not difficult at all Not difficult at all -    BP Readings from Last 3 Encounters:  05/20/21 140/72  04/24/21 (!) 143/93  03/11/21 114/84    Physical Exam Vitals and nursing note reviewed.  Constitutional:      General: She is not in acute distress.    Appearance: Normal appearance. She is well-developed.  HENT:     Head: Normocephalic and atraumatic.  Neck:     Vascular: No carotid bruit.  Cardiovascular:     Rate and Rhythm: Normal rate and regular rhythm.     Pulses: Normal pulses.  Pulmonary:     Effort: Pulmonary effort is normal. No respiratory distress.     Breath sounds: No wheezing or rhonchi.  Abdominal:     General: Bowel sounds are normal.     Tenderness: There is abdominal tenderness in the right upper quadrant. There is no right CVA tenderness, left CVA tenderness, guarding or rebound.  Musculoskeletal:     Cervical back: Normal range of motion.  Lymphadenopathy:     Cervical: No cervical adenopathy.  Skin:    General: Skin is warm and dry.     Findings: No rash.  Neurological:     Mental Status: She is alert and oriented to person, place, and time.  Psychiatric:        Mood and Affect: Mood normal.         Behavior: Behavior normal.    Wt Readings from Last 3 Encounters:  05/20/21 184 lb (83.5 kg)  04/24/21 186 lb 4.8 oz (84.5 kg)  03/11/21 180 lb (81.6 kg)    BP 140/72 (BP Location: Right Arm, Patient Position: Sitting, Cuff Size: Large)   Pulse (!) 108   Ht 5\' 1"  (1.549 m)   Wt 184 lb (83.5 kg)   SpO2 96%   BMI 34.77 kg/m   Assessment and Plan: 1. Migraine without aura  and without status migrainosus, not intractable Continue PRN triptans She has used 20 Tramadol in the past 14 days so no refills can be given Sample of Ubrelvy 50 mg daily PRN given - Ambulatory referral to Neurology  2. Right upper quadrant abdominal tenderness without rebound tenderness Minimal pain which is consistent with her usual discomfort Continue to monitor for worsening.   Partially dictated using Animal nutritionist. Any errors are unintentional.  Bari Edward, MD [facility]  05/20/2021

## 2021-05-20 NOTE — Patient Instructions (Signed)
Try Ubrelvy 50 mg - once a day as needed for migraine

## 2021-05-24 ENCOUNTER — Ambulatory Visit (INDEPENDENT_AMBULATORY_CARE_PROVIDER_SITE_OTHER): Payer: Medicaid Other | Admitting: Obstetrics and Gynecology

## 2021-05-24 ENCOUNTER — Other Ambulatory Visit (HOSPITAL_COMMUNITY)
Admission: RE | Admit: 2021-05-24 | Discharge: 2021-05-24 | Disposition: A | Discharge status: Home / Self Care | Payer: Medicaid Other | Source: Ambulatory Visit | Attending: Obstetrics and Gynecology | Admitting: Obstetrics and Gynecology

## 2021-05-24 ENCOUNTER — Other Ambulatory Visit: Payer: Self-pay

## 2021-05-24 ENCOUNTER — Encounter: Payer: Self-pay | Admitting: Obstetrics and Gynecology

## 2021-05-24 VITALS — BP 112/79 | HR 98 | Ht 60.0 in | Wt 185.1 lb

## 2021-05-24 DIAGNOSIS — Z0000 Encounter for general adult medical examination without abnormal findings: Secondary | ICD-10-CM

## 2021-05-24 DIAGNOSIS — Z01419 Encounter for gynecological examination (general) (routine) without abnormal findings: Secondary | ICD-10-CM

## 2021-05-24 DIAGNOSIS — Z124 Encounter for screening for malignant neoplasm of cervix: Secondary | ICD-10-CM | POA: Diagnosis present

## 2021-05-24 DIAGNOSIS — N946 Dysmenorrhea, unspecified: Secondary | ICD-10-CM

## 2021-05-24 DIAGNOSIS — Z30011 Encounter for initial prescription of contraceptive pills: Secondary | ICD-10-CM | POA: Diagnosis not present

## 2021-05-24 DIAGNOSIS — N649 Disorder of breast, unspecified: Secondary | ICD-10-CM

## 2021-05-24 MED ORDER — SLYND 4 MG PO TABS: 1.0000 | ORAL_TABLET | Freq: Every day | ORAL | 3 refills | Status: AC

## 2021-05-24 NOTE — Addendum Note (Signed)
Addended by: Dorian Pod on: 05/24/2021 08:39 AM   Modules accepted: Orders

## 2021-05-24 NOTE — Progress Notes (Signed)
HPI:      [name] is a 40 y.o. No obstetric history on file. who LMP was No LMP recorded.  Subjective:   She presents today for her annual examination.  She states that the breast lesion that she had a few weeks ago has now opened and drained and appears to be healing.  She initially made an appointment with general surgery but has canceled that. She complains that her cycles remain irregular and sometimes very heavy.  They are sometimes accompanied by severe cramps that causes nausea. Of significant note, patient has a tubal ligation for birth control. She does smoke cigarettes.    Hx: The following portions of the patient's history were reviewed and updated as appropriate:             She  has a past medical history of Hypertension, Kidney stones, Leukocytosis (08/16/2018), and Migraine. She does not have any pertinent problems on file. She  has a past surgical history that includes Tubal ligation (Bilateral, 2007) and Dilation and curettage of uterus. Her family history includes Endometriosis in her maternal grandmother; Other in her father and mother. She  reports that she has been smoking cigarettes. She started smoking about 26 years ago. She has a 12.00 pack-year smoking history. She has never used smokeless tobacco. She reports that she does not drink alcohol and does not use drugs. She has a current medication list which includes the following prescription(s): slynd, lisinopril, naratriptan, tramadol, [DISCONTINUED] diphenhydramine hcl, and [DISCONTINUED] omeprazole. She is allergic to sulfa antibiotics and azithromycin.       Review of Systems:  Review of Systems  Constitutional: Denied constitutional symptoms, night sweats, recent illness, fatigue, fever, insomnia and weight loss.  Eyes: Denied eye symptoms, eye pain, photophobia, vision change and visual disturbance.  Ears/Nose/Throat/Neck: Denied ear, nose, throat or neck symptoms, hearing loss, nasal discharge,  sinus congestion and sore throat.  Cardiovascular: Denied cardiovascular symptoms, arrhythmia, chest pain/pressure, edema, exercise intolerance, orthopnea and palpitations.  Respiratory: Denied pulmonary symptoms, asthma, pleuritic pain, productive sputum, cough, dyspnea and wheezing.  Gastrointestinal: Denied, gastro-esophageal reflux, melena, nausea and vomiting.  Genitourinary: See HPI for additional information.  Musculoskeletal: Denied musculoskeletal symptoms, stiffness, swelling, muscle weakness and myalgia.  Dermatologic: Denied dermatology symptoms, rash and scar.  Neurologic: Denied neurology symptoms, dizziness, headache, neck pain and syncope.  Psychiatric: Denied psychiatric symptoms, anxiety and depression.  Endocrine: Denied endocrine symptoms including hot flashes and night sweats.   Meds:   Current Outpatient Medications on File Prior to Visit  Medication Sig Dispense Refill   lisinopril (ZESTRIL) 20 MG tablet Take 1 tablet (20 mg total) by mouth daily. 90 tablet 1   naratriptan (AMERGE) 2.5 MG tablet Take 1 tablet (2.5 mg total) by mouth as needed for migraine. Take one (1) tablet at onset of headache; if returns or does not resolve, may repeat after 4 hours; do not exceed five (5) mg in 24 hours. 10 tablet 0   traMADol (ULTRAM) 50 MG tablet Take 1 tablet (50 mg total) by mouth every 8 (eight) hours as needed (migraine HA). 20 tablet 2   [DISCONTINUED] diphenhydrAMINE HCl (BENADRYL PO) Take by mouth as needed.     [DISCONTINUED] omeprazole (PRILOSEC) 10 MG capsule Take 1 capsule (10 mg total) by mouth daily. (Patient not taking: No sig reported) 30 capsule 0   No current facility-administered medications on file prior to visit.          Objective:     Vitals:  05/24/21 0805  BP: 112/79  Pulse: 98    Filed Weights   05/24/21 0805  Weight: 185 lb 1.6 oz (84 kg)              Physical examination General NAD, Conversant  HEENT Atraumatic; Op clear with mmm.   Normo-cephalic. Pupils reactive. Anicteric sclerae  Thyroid/Neck Smooth without nodularity or enlargement. Normal ROM.  Neck Supple.  Skin No rashes, lesions or ulceration. Normal palpated skin turgor. No nodularity.  Breasts: No masses or discharge.  Symmetric.  No axillary adenopathy.  Area of concern on the right breast is healing well.  Lungs: Clear to auscultation.No rales or wheezes. Normal Respiratory effort, no retractions.  Heart: NSR.  No murmurs or rubs appreciated. No periferal edema  Abdomen: Soft.  Non-tender.  No masses.  No HSM. No hernia  Extremities: Moves all appropriately.  Normal ROM for age. No lymphadenopathy.  Neuro: Oriented to PPT.  Normal mood. Normal affect.     Pelvic:   Vulva: Normal appearance.  No lesions.  Vagina: No lesions or abnormalities noted.  Fairly heavy bleeding noted consistent with menses  Support: Normal pelvic support.  Urethra No masses tenderness or scarring.  Meatus Normal size without lesions or prolapse.  Cervix: Normal appearance.  No lesions.  Anus: Normal exam.  No lesions.  Perineum: Normal exam.  No lesions.        Bimanual   Uterus: Normal size.  Non-tender.  Mobile.  AV.  Adnexae: No masses.  Non-tender to palpation.  Cul-de-sac: Negative for abnormality.     Assessment:    No obstetric history on file. Patient Active Problem List   Diagnosis Date Noted   Left cervical radiculopathy 02/04/2021   Supraspinatus tendinitis, left 02/04/2021   Cervical paraspinal muscle spasm 02/04/2021   Rosacea 01/09/2020   Panic disorder with agoraphobia 07/11/2019   Pre-diabetes 01/26/2019   Liver mass 01/25/2019   Essential hypertension 07/15/2017   Gastroesophageal reflux disease without esophagitis 12/18/2016   History of kidney stones 06/04/2015   Dysmenorrhea 06/04/2015   Migraine without aura and without status migrainosus, not intractable 06/04/2015     1. Well woman exam with routine gynecological exam   2. Breast lesion    3. Initiation of OCP (BCP)   4. Dysmenorrhea     Breast lesion-abscess seems to be resolving.  Patient very uncomfortable with irregular and heavy menses.   Plan:            1.  Basic Screening Recommendations The basic screening recommendations for asymptomatic women were discussed with the patient during her visit.  The age-appropriate recommendations were discussed with her and the rational for the tests reviewed.  When I am informed by the patient that another primary care physician has previously obtained the age-appropriate tests and they are up-to-date, only outstanding tests are ordered and referrals given as necessary.  Abnormal results of tests will be discussed with her when all of her results are completed.  Routine preventative health maintenance measures emphasized: Exercise/Diet/Weight control, Tobacco Warnings, Alcohol/Substance use risks and Stress Management Pap performed 2.  Discussed multiple methods of cycle control for her dysmenorrhea.  She has strongly declined IUD.  Combination OCPs are contraindicated because of her tobacco use.  We will try Slynd for cycle control.  I have discussed this in detail with her. 3.  Expectation that right breast cyst has now resolved.  If it recurs the patient will inform us. Orders No orders of the defined types were  placed in this encounter.    Meds ordered this encounter  Medications   Drospirenone (SLYND) 4 MG TABS    Sig: Take 1 tablet by mouth daily for 28 days.    Dispense:  28 tablet    Refill:  3         F/U  Return in about 3 months (around 08/24/2021).  Elonda Husky, M.D. 05/24/2021 8:31 AM

## 2021-05-29 LAB — CYTOLOGY - PAP
Comment: NEGATIVE
Diagnosis: NEGATIVE
High risk HPV: NEGATIVE

## 2021-06-20 ENCOUNTER — Encounter: Payer: Self-pay | Admitting: Internal Medicine

## 2021-06-25 ENCOUNTER — Emergency Department
Admission: EM | Admit: 2021-06-25 | Discharge: 2021-06-25 | Disposition: A | Discharge status: Home / Self Care | Payer: Medicaid Other | Attending: Emergency Medicine | Admitting: Emergency Medicine

## 2021-06-25 ENCOUNTER — Encounter: Payer: Self-pay | Admitting: Emergency Medicine

## 2021-06-25 ENCOUNTER — Other Ambulatory Visit: Payer: Self-pay

## 2021-06-25 DIAGNOSIS — G43809 Other migraine, not intractable, without status migrainosus: Secondary | ICD-10-CM | POA: Insufficient documentation

## 2021-06-25 DIAGNOSIS — F17210 Nicotine dependence, cigarettes, uncomplicated: Secondary | ICD-10-CM | POA: Diagnosis not present

## 2021-06-25 DIAGNOSIS — Z79899 Other long term (current) drug therapy: Secondary | ICD-10-CM | POA: Diagnosis not present

## 2021-06-25 DIAGNOSIS — I10 Essential (primary) hypertension: Secondary | ICD-10-CM | POA: Insufficient documentation

## 2021-06-25 DIAGNOSIS — R519 Headache, unspecified: Secondary | ICD-10-CM | POA: Diagnosis present

## 2021-06-25 MED ORDER — SODIUM CHLORIDE 0.9 % IV BOLUS
1000.0000 mL | Freq: Once | INTRAVENOUS | Status: AC
  Administered 2021-06-25: 1000 mL via INTRAVENOUS

## 2021-06-25 MED ORDER — KETOROLAC TROMETHAMINE 30 MG/ML IJ SOLN
30.0000 mg | Freq: Once | INTRAMUSCULAR | Status: AC
  Administered 2021-06-25: 30 mg via INTRAVENOUS
  Filled 2021-06-25: qty 1

## 2021-06-25 MED ORDER — DIPHENHYDRAMINE HCL 50 MG/ML IJ SOLN
25.0000 mg | Freq: Once | INTRAMUSCULAR | Status: AC
  Administered 2021-06-25: 25 mg via INTRAVENOUS
  Filled 2021-06-25: qty 1

## 2021-06-25 MED ORDER — METOCLOPRAMIDE HCL 5 MG/ML IJ SOLN
10.0000 mg | Freq: Once | INTRAMUSCULAR | Status: AC
  Administered 2021-06-25: 10 mg via INTRAVENOUS
  Filled 2021-06-25: qty 2

## 2021-06-25 NOTE — ED Notes (Signed)
See triage note  Presents with a h/a  States she developed a migraine about 1 1/2 weeks ago  Pain is to right side of head/temple area  Positive dizziness and nausea  Placed on stretcher with lights out

## 2021-06-25 NOTE — ED Triage Notes (Signed)
Pt reports migraine to the right side of her head for the past week. Pt reports used to take tramadol but it has not relieved the pain and was prescribed topamax today but needs the relief now.

## 2021-06-25 NOTE — ED Provider Notes (Signed)
[facility] Emergency Department Provider Note ____________________________________________   Event Date/Time   First MD Initiated Contact with Patient 06/25/21 1036     (approximate)  I have reviewed the triage vital signs and the nursing notes.   HISTORY  Chief Complaint Migraine    HPI Amy Gomez is a 40 y.o. female with PMH as noted below including prior migraines who presents with a headache over the last 9 days, constant, located over her right temple, described as throbbing, and associated with nausea, few episodes of vomiting, and photophobia.  The patient states that she has had migraines in the past with a similar location and quality of the pain, but they have never lasted this long.  She denies any head injury.  She has no fever or chills.  She went to the [facility] neurology office today and was prescribed nortriptyline and states she was sent to the ED for "a scan."  Past Medical History:  Diagnosis Date   Hypertension    Kidney stones    Leukocytosis 08/16/2018   Migraine     Patient Active Problem List   Diagnosis Date Noted   Left cervical radiculopathy 02/04/2021   Supraspinatus tendinitis, left 02/04/2021   Cervical paraspinal muscle spasm 02/04/2021   Rosacea 01/09/2020   Panic disorder with agoraphobia 07/11/2019   Pre-diabetes 01/26/2019   Liver mass 01/25/2019   Essential hypertension 07/15/2017   Gastroesophageal reflux disease without esophagitis 12/18/2016   History of kidney stones 06/04/2015   Dysmenorrhea 06/04/2015   Migraine without aura and without status migrainosus, not intractable 06/04/2015    Past Surgical History:  Procedure Laterality Date   DILATION AND CURETTAGE OF UTERUS     TUBAL LIGATION Bilateral 2007    Prior to Admission medications   Medication Sig Start Date End Date Taking? Authorizing Provider  lisinopril (ZESTRIL) 20 MG tablet Take 1 tablet (20 mg total) by mouth daily.  10/29/20   Reubin Milan, MD  naratriptan (AMERGE) 2.5 MG tablet Take 1 tablet (2.5 mg total) by mouth as needed for migraine. Take one (1) tablet at onset of headache; if returns or does not resolve, may repeat after 4 hours; do not exceed five (5) mg in 24 hours. 05/01/21   Reubin Milan, MD  diphenhydrAMINE HCl (BENADRYL PO) Take by mouth as needed.  09/03/20  [provider]  omeprazole (PRILOSEC) 10 MG capsule Take 1 capsule (10 mg total) by mouth daily. Patient not taking: No sig reported 09/24/19 12/06/19  Cuthriell, Delorise Royals, PA-C    Allergies Sulfa antibiotics and Azithromycin  Family History  Problem Relation Age of Onset   Other Mother        unknown medical history   Other Father        unknown medical hisotry   Endometriosis Maternal Grandmother     Social History Social History   Tobacco Use   Smoking status: Every Day    Packs/day: 0.50    Years: 24.00    Pack years: 12.00    Types: Cigarettes    Start date: 1996   Smokeless tobacco: Never  Vaping Use   Vaping Use: Never used  Substance Use Topics   Alcohol use: No    Alcohol/week: 0.0 standard drinks   Drug use: No    Review of Systems  Constitutional: No fever/chills Eyes: No visual changes. ENT: No sore throat. Cardiovascular: Denies chest pain. Respiratory: Denies shortness of breath. Gastrointestinal: Positive for nausea. Genitourinary: Negative  for dysuria.  Musculoskeletal: Negative for back pain. Skin: Negative for rash. Neurological: Positive for headache.   ____________________________________________   PHYSICAL EXAM:  VITAL SIGNS: ED Triage Vitals  Enc Vitals Group     BP 06/25/21 0908 (!) 160/106     Pulse Rate 06/25/21 0908 89     Resp 06/25/21 0908 17     Temp 06/25/21 0908 98.6 F (37 C)     Temp Source 06/25/21 0908 Oral     SpO2 06/25/21 0908 97 %     Weight 06/25/21 0909 190 lb (86.2 kg)     Height 06/25/21 0909 5' (1.524 m)     Head Circumference  --      Peak Flow --      Pain Score 06/25/21 0909 8     Pain Loc --      Pain Edu? --      Excl. in GC? --     Constitutional: Alert and oriented.  Relatively well appearing, in no acute distress. Eyes: Conjunctivae are normal.  EOMI.  PERRLA. Head: Atraumatic. Nose: No congestion/rhinnorhea. Mouth/Throat: Mucous membranes are moist.   Neck: Normal range of motion.  No meningeal signs. Cardiovascular: Normal rate, regular rhythm. Good peripheral circulation. Respiratory: Normal respiratory effort.  No retractions. Gastrointestinal: No distention.  Musculoskeletal: Extremities warm and well perfused.  Neurologic:  Normal speech and language.  Motor intact in all extremities.  Normal coordination no ataxia on finger-to-nose.  No pronator drift.  No facial droop. Skin:  Skin is warm and dry. No rash noted. Psychiatric: Mood and affect are normal. Speech and behavior are normal.  ____________________________________________   LABS (all labs ordered are listed, but only abnormal results are displayed)  Labs Reviewed - No data to display ____________________________________________  EKG   ____________________________________________  RADIOLOGY   ____________________________________________   PROCEDURES  Procedure(s) performed: No  Procedures  Critical Care performed: No ____________________________________________   INITIAL IMPRESSION / ASSESSMENT AND PLAN / ED COURSE  Pertinent labs & imaging results that were available during my care of the patient were reviewed by me and considered in my medical decision making (see chart for details).   40 year old female with PMH as noted above including a prior history of migraines presents with a headache on the right side of her head for over a week, similar in quality to prior migraines but lasting much longer.  She was referred from the neurology clinic.  On exam the patient is overall well-appearing.  Her vital signs are  normal except for hypertension.  The physical exam is otherwise unremarkable, and the neurologic exam is normal.  Overall presentation is consistent with migraine versus tension or other benign etiology of headache.  There is no clinical evidence for ICH or meningitis.  Given the neurology referral and the change in pattern of the headache we will obtain an MRI for further evaluation and treat symptomatically with Reglan and Toradol.  ----------------------------------------- 11:44 AM on 06/25/2021 -----------------------------------------  Patient developed apparent akathisia, stating that she felt very anxious and wanted to leave.  She received a dose of Benadryl and is now much more comfortable.  The symptoms have resolved.  The patient states that the headache has also completely resolved.  She states that she still wants to be discharged and does not want to wait for the MRI since she needs to pick up a family member from school in less than an hour.  She states that the neurology office had told her she could also  schedule an outpatient MRI through them and she would prefer to do this.  At this time, given the normal neurologic exam, resolved headache, and overall reassuring presentation, this is an appropriate plan.  I gave the patient thorough return precautions, and she expresses understanding.  ____________________________________________   FINAL CLINICAL IMPRESSION(S) / ED DIAGNOSES  Final diagnoses:  Other migraine without status migrainosus, not intractable      NEW MEDICATIONS STARTED DURING THIS VISIT:  New Prescriptions   No medications on file     Note:  This document was prepared using Dragon voice recognition software and may include unintentional dictation errors.    Dionne Bucy, MD 06/25/21 1146

## 2021-06-25 NOTE — Discharge Instructions (Addendum)
Follow-up with the neurologist as scheduled.  Return to the ER immediately for new, worsening, or persistent severe headache, nausea or vomiting, fever, weakness, vision changes, or any other new or worsening symptoms that concern you.

## 2021-06-25 NOTE — ED Notes (Signed)
States the reglan made her anxious   Additional meds given states she needs to leave to pick up her family  Provider aware

## 2021-06-25 NOTE — ED Notes (Signed)
MRI screening completed.

## 2021-06-27 ENCOUNTER — Other Ambulatory Visit: Payer: Self-pay | Admitting: Physician Assistant

## 2021-06-27 DIAGNOSIS — R519 Headache, unspecified: Secondary | ICD-10-CM

## 2021-06-27 DIAGNOSIS — R110 Nausea: Secondary | ICD-10-CM

## 2021-06-27 DIAGNOSIS — H539 Unspecified visual disturbance: Secondary | ICD-10-CM

## 2021-06-27 DIAGNOSIS — H53149 Visual discomfort, unspecified: Secondary | ICD-10-CM

## 2021-07-03 ENCOUNTER — Ambulatory Visit
Admission: RE | Admit: 2021-07-03 | Discharge: 2021-07-03 | Disposition: A | Discharge status: Home / Self Care | Payer: Medicaid Other | Source: Ambulatory Visit | Attending: Physician Assistant | Admitting: Physician Assistant

## 2021-07-03 ENCOUNTER — Other Ambulatory Visit: Payer: Self-pay

## 2021-07-03 DIAGNOSIS — H539 Unspecified visual disturbance: Secondary | ICD-10-CM | POA: Diagnosis present

## 2021-07-03 DIAGNOSIS — R110 Nausea: Secondary | ICD-10-CM | POA: Insufficient documentation

## 2021-07-03 DIAGNOSIS — H53149 Visual discomfort, unspecified: Secondary | ICD-10-CM | POA: Diagnosis present

## 2021-07-03 DIAGNOSIS — R519 Headache, unspecified: Secondary | ICD-10-CM | POA: Insufficient documentation

## 2021-07-08 ENCOUNTER — Encounter: Payer: Self-pay | Admitting: Internal Medicine

## 2021-07-08 ENCOUNTER — Other Ambulatory Visit: Payer: Self-pay

## 2021-07-08 ENCOUNTER — Ambulatory Visit (INDEPENDENT_AMBULATORY_CARE_PROVIDER_SITE_OTHER): Payer: Medicaid Other | Admitting: Internal Medicine

## 2021-07-08 VITALS — BP 146/96 | HR 94 | Temp 99.4°F | Ht 60.0 in | Wt 190.0 lb

## 2021-07-08 DIAGNOSIS — G43009 Migraine without aura, not intractable, without status migrainosus: Secondary | ICD-10-CM

## 2021-07-08 DIAGNOSIS — H9201 Otalgia, right ear: Secondary | ICD-10-CM | POA: Diagnosis not present

## 2021-07-08 DIAGNOSIS — I10 Essential (primary) hypertension: Secondary | ICD-10-CM

## 2021-07-08 MED ORDER — LISINOPRIL 20 MG PO TABS: 20.0000 mg | ORAL_TABLET | Freq: Every day | ORAL | 1 refills | Status: DC

## 2021-07-08 NOTE — Progress Notes (Signed)
Date:  07/08/2021   Name:  Amy Gomez   DOB:  1981-08-21   MRN:  [id number]   Chief Complaint: Ear Pain (Right; more severe x2 weeks; 7/10 pain)  Has been dealing with severe recurrent/persistent migraine type headache.  She was seen by Neurology and started on Nortriptyline and Fioricet.  An MRI was normal. She was having most of her pain on the right side.  First the temple then the face and now in the jaw area.  Along with that she is having now right ear pain relieved by hot packs.  No sinus sx.    Otalgia  There is pain in the right ear. This is a new problem. The current episode started 1 to 4 weeks ago. Pertinent negatives include no headaches or sore throat.  Hypertension This is a chronic problem. The problem is uncontrolled. Pertinent negatives include no chest pain, headaches or shortness of breath. Past treatments include ACE inhibitors (mistakenly only on lisinopril 5 mg).   Lab Results  Component Value Date   CREATININE 0.60 12/17/2020   BUN 12 12/17/2020   NA 137 12/17/2020   K 3.9 12/17/2020   CL 105 12/17/2020   CO2 21 (L) 12/17/2020   No results found for: CHOL, HDL, LDLCALC, LDLDIRECT, TRIG, CHOLHDL Lab Results  Component Value Date   TSH 0.954 04/24/2021   Lab Results  Component Value Date   HGBA1C 5.8 (H) 01/26/2019   Lab Results  Component Value Date   WBC 13.2 (H) 12/17/2020   HGB 13.5 12/17/2020   HCT 40.1 12/17/2020   MCV 83.9 12/17/2020   PLT 277 12/17/2020   Lab Results  Component Value Date   ALT 17 12/17/2020   AST 17 12/17/2020   ALKPHOS 69 12/17/2020   BILITOT 0.6 12/17/2020     Review of Systems  Constitutional:  Negative for chills, fatigue and fever.  HENT:  Positive for ear pain. Negative for sinus pressure and sore throat.   Respiratory:  Negative for chest tightness and shortness of breath.   Cardiovascular:  Negative for chest pain.  Neurological:  Negative for dizziness and headaches.   Patient Active Problem  List   Diagnosis Date Noted   Left cervical radiculopathy 02/04/2021   Supraspinatus tendinitis, left 02/04/2021   Cervical paraspinal muscle spasm 02/04/2021   Rosacea 01/09/2020   Panic disorder with agoraphobia 07/11/2019   Pre-diabetes 01/26/2019   Liver mass 01/25/2019   Essential hypertension 07/15/2017   Gastroesophageal reflux disease without esophagitis 12/18/2016   History of kidney stones 06/04/2015   Dysmenorrhea 06/04/2015   Migraine without aura and without status migrainosus, not intractable 06/04/2015    Allergies  Allergen Reactions   Sulfa Antibiotics Anaphylaxis, Swelling, Rash and Other (See Comments)   Azithromycin Rash    Rash as a teenager, "Z Pack"    Past Surgical History:  Procedure Laterality Date   DILATION AND CURETTAGE OF UTERUS     TUBAL LIGATION Bilateral 2007    Social History   Tobacco Use   Smoking status: Every Day    Packs/day: 0.50    Years: 24.00    Pack years: 12.00    Types: Cigarettes    Start date: 1996   Smokeless tobacco: Never  Vaping Use   Vaping Use: Never used  Substance Use Topics   Alcohol use: No    Alcohol/week: 0.0 standard drinks   Drug use: No     Medication list has been reviewed and updated.  Current Meds  Medication Sig   nortriptyline (PAMELOR) 10 MG capsule Take 10 mg by mouth daily.   traMADol (ULTRAM) 50 MG tablet Take 50 mg by mouth every 6 (six) hours as needed.   [DISCONTINUED] lisinopril (ZESTRIL) 5 MG tablet Take 5 mg by mouth daily.    PHQ 2/9 Scores 07/08/2021 05/20/2021 03/11/2021 02/04/2021  PHQ - 2 Score 0 1 0 0  PHQ- 9 Score 7 8 0 0    GAD 7 : Generalized Anxiety Score 07/08/2021 05/20/2021 03/11/2021 02/04/2021  Nervous, Anxious, on Edge 1 2 3 1   Control/stop worrying 1 0 3 2  Worry too much - different things 1 3 2 2   Trouble relaxing 1 3 0 1  Restless 0 0 0 1  Easily annoyed or irritable 1 1 1 1   Afraid - awful might happen 1 0 0 1  Total GAD 7 Score 6 9 9 9   Anxiety Difficulty  Not difficult at all Not difficult at all Not difficult at all Not difficult at all    BP Readings from Last 3 Encounters:  07/08/21 (!) 146/96  06/25/21 (!) 160/106  05/24/21 112/79    Physical Exam Vitals and nursing note reviewed.  Constitutional:      General: She is not in acute distress.    Appearance: Normal appearance. She is well-developed.  HENT:     Head: Normocephalic and atraumatic.     Jaw: Tenderness (over right TMJ) present.     Right Ear: Hearing, tympanic membrane and ear canal normal.     Left Ear: Hearing, tympanic membrane and ear canal normal.     Nose:     Right Sinus: No maxillary sinus tenderness.     Left Sinus: No maxillary sinus tenderness.  Cardiovascular:     Rate and Rhythm: Normal rate and regular rhythm.  Pulmonary:     Effort: Pulmonary effort is normal. No respiratory distress.     Breath sounds: No wheezing or rhonchi.  Musculoskeletal:     Cervical back: Normal range of motion.  Skin:    General: Skin is warm and dry.     Findings: No rash.  Neurological:     Mental Status: She is alert and oriented to person, place, and time.  Psychiatric:        Mood and Affect: Mood normal.        Behavior: Behavior normal.    Wt Readings from Last 3 Encounters:  07/08/21 190 lb (86.2 kg)  06/25/21 190 lb (86.2 kg)  05/24/21 185 lb 1.6 oz (84 kg)    BP (!) 146/96 (BP Location: Left Arm, Patient Position: Sitting, Cuff Size: Large)   Pulse 94   Temp 99.4 F (37.4 C) (Oral)   Ht 5' (1.524 m)   Wt 190 lb (86.2 kg)   LMP 06/23/2021 (Exact Date)   SpO2 96%   BMI 37.11 kg/m   Assessment and Plan: 1. Migraine without aura and without status migrainosus, not intractable Some improvement with change in therapy Continue current plan - contact neurology she does not continue to improve in one week for their recommendations.  2. Essential hypertension BP not controlled - likely due to mix up in medication dose. She has only been taking 5  mg. Resume 20 mg per day. Follow up in 3 months. - lisinopril (ZESTRIL) 20 MG tablet; Take 1 tablet (20 mg total) by mouth daily.  Dispense: 90 tablet; Refill: 1  3. Ear pain, referred, right Suspect the ear  pain is referred.  Possible causes are TMJ, Trigeminal neuralgia, migrainous. Continue hot compresses and Advil. MRI is reassuring.   Partially dictated using Animal nutritionist. Any errors are unintentional.  Bari Edward, MD [facility]  07/08/2021

## 2021-07-25 ENCOUNTER — Encounter: Payer: Self-pay | Admitting: Internal Medicine

## 2021-07-26 ENCOUNTER — Other Ambulatory Visit: Payer: Self-pay | Admitting: Internal Medicine

## 2021-07-26 DIAGNOSIS — G43009 Migraine without aura, not intractable, without status migrainosus: Secondary | ICD-10-CM

## 2021-07-26 MED ORDER — TRAMADOL HCL 50 MG PO TABS: 50.0000 mg | ORAL_TABLET | Freq: Every day | ORAL | 0 refills | Status: DC | PRN

## 2021-07-31 ENCOUNTER — Encounter: Payer: Self-pay | Admitting: Internal Medicine

## 2021-08-19 ENCOUNTER — Other Ambulatory Visit: Payer: Self-pay

## 2021-08-19 ENCOUNTER — Encounter: Payer: Self-pay | Admitting: Internal Medicine

## 2021-08-19 ENCOUNTER — Ambulatory Visit (INDEPENDENT_AMBULATORY_CARE_PROVIDER_SITE_OTHER): Payer: Medicaid Other

## 2021-08-19 DIAGNOSIS — Z23 Encounter for immunization: Secondary | ICD-10-CM

## 2021-08-28 ENCOUNTER — Encounter: Payer: Medicaid Other | Admitting: Obstetrics and Gynecology

## 2021-09-02 ENCOUNTER — Encounter: Payer: Self-pay | Admitting: Internal Medicine

## 2021-09-10 ENCOUNTER — Encounter: Payer: Self-pay | Admitting: Obstetrics and Gynecology

## 2021-09-10 ENCOUNTER — Encounter: Payer: Medicaid Other | Admitting: Obstetrics and Gynecology

## 2021-09-26 ENCOUNTER — Encounter: Payer: Self-pay | Admitting: Internal Medicine

## 2021-10-07 IMAGING — CR DG HAND COMPLETE 3+V*R*
3 series · 3 of 3 positions shown · non-contrast
Comparison: None.

CLINICAL DATA: Pain following fall

EXAM:
RIGHT HAND - COMPLETE 3+ VIEW

[hand ap]
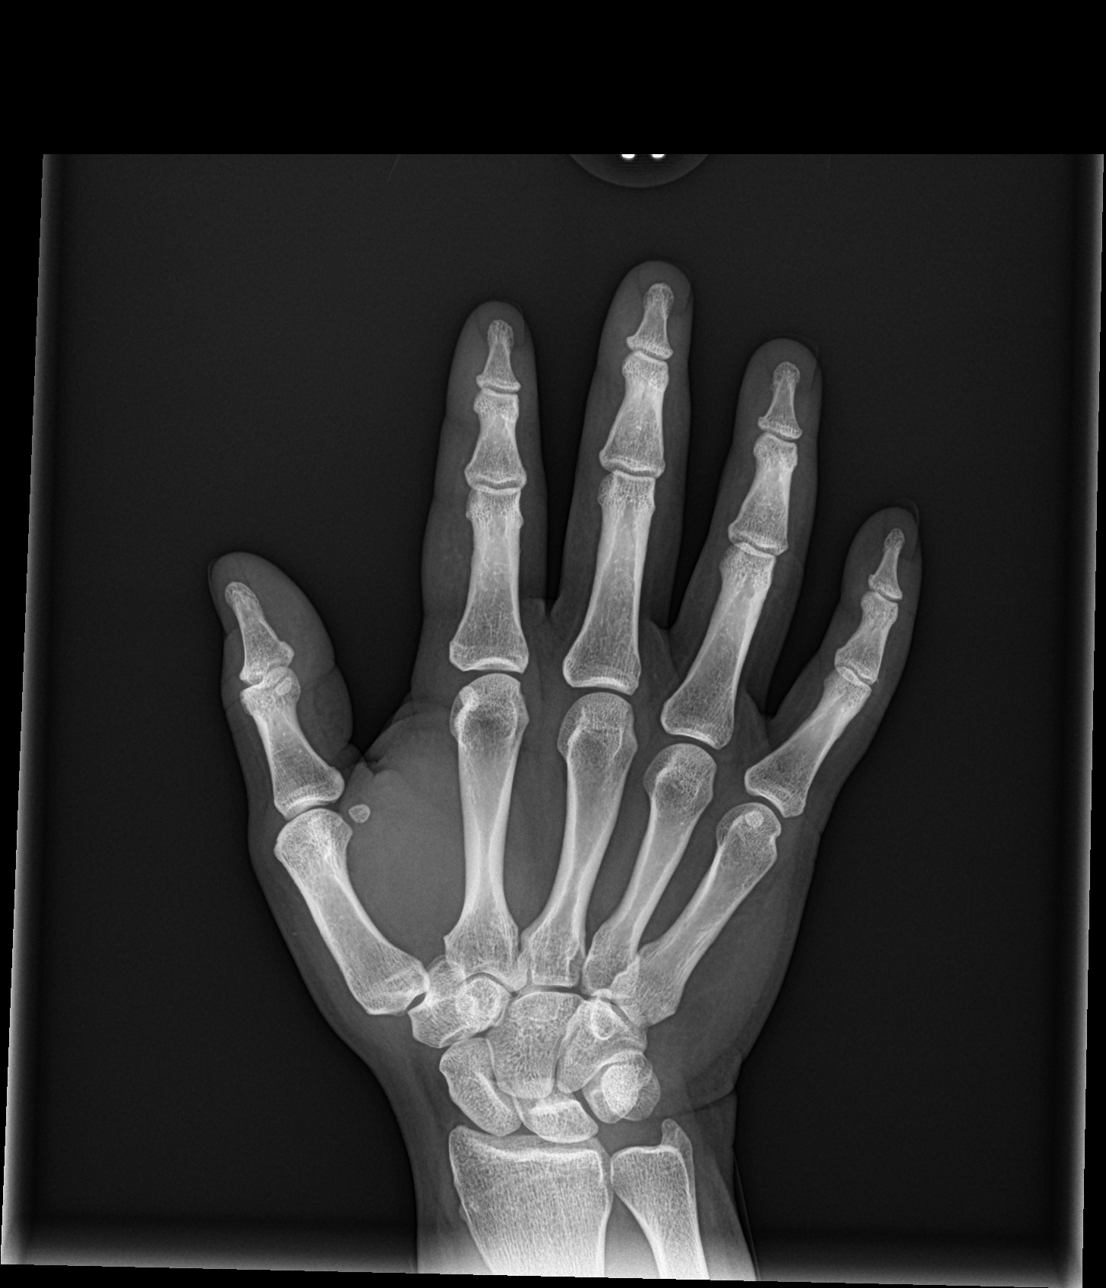

[hand obl]
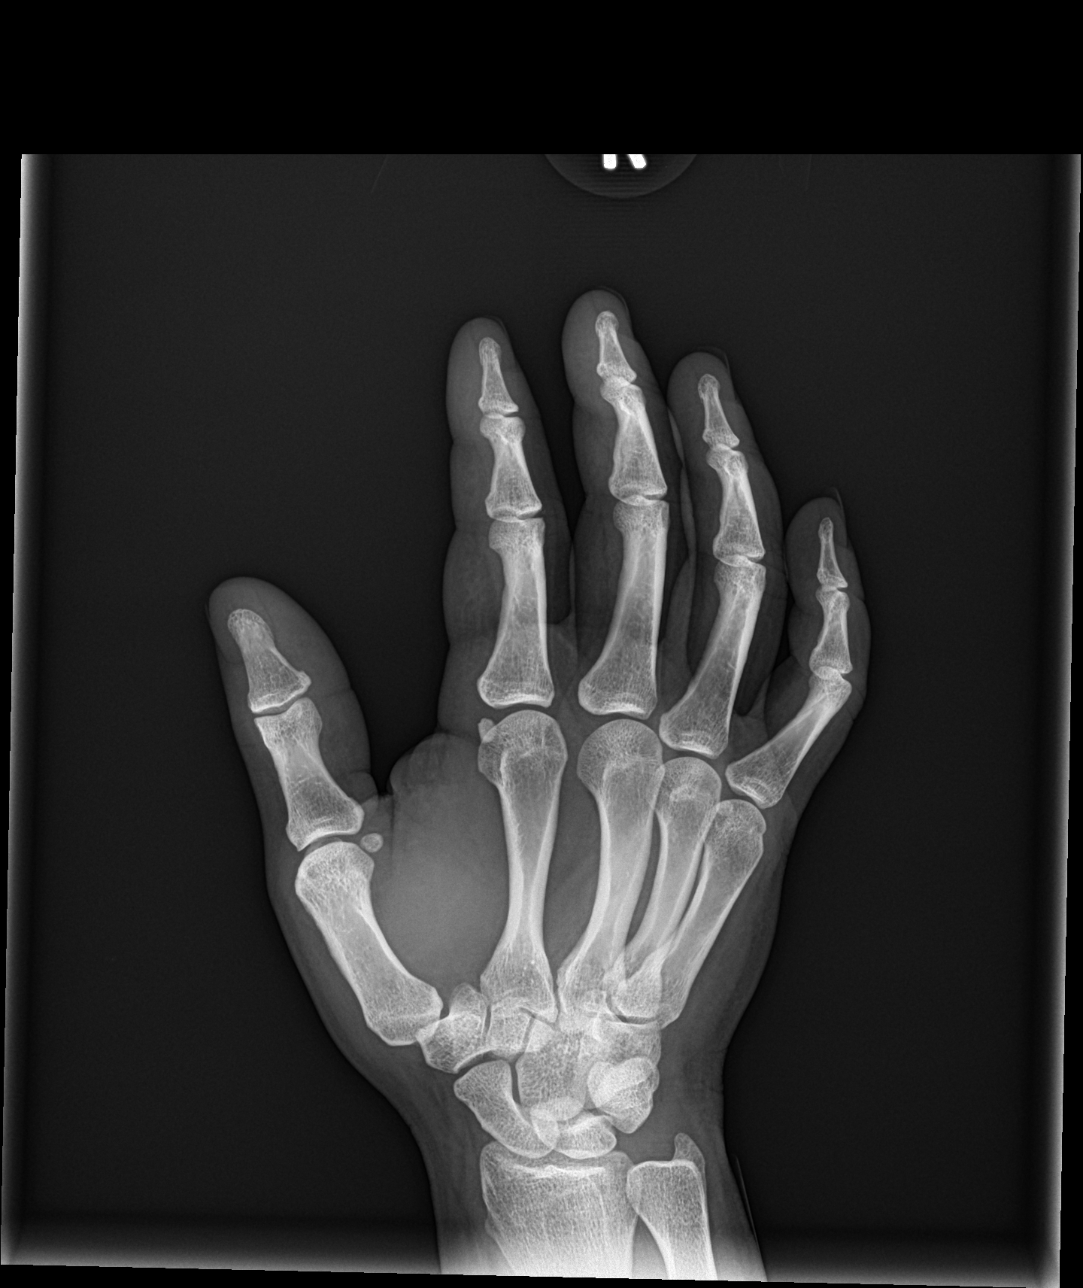

[hand lat]
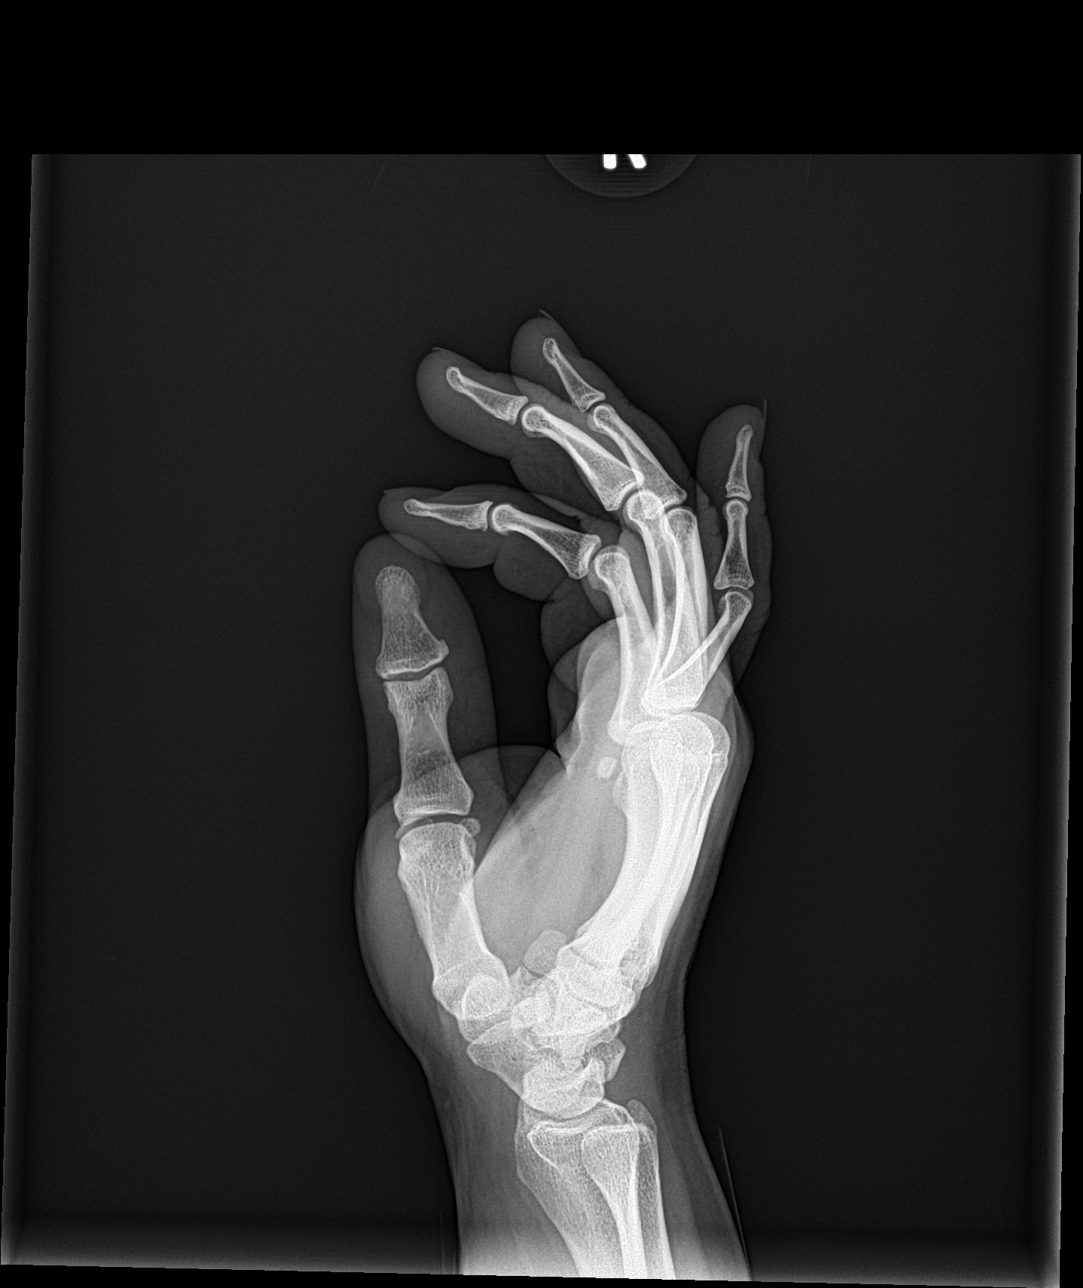

[3 of 3 positions shown; findings below may reference images not displayed]

FINDINGS: Frontal, oblique, and lateral views were obtained. There is no
fracture or dislocation. Joint spaces appear normal. No erosive
change.
IMPRESSION: No fracture or dislocation.  No evident arthropathy.

## 2021-10-08 ENCOUNTER — Other Ambulatory Visit: Payer: Self-pay

## 2021-10-08 ENCOUNTER — Encounter: Payer: Self-pay | Admitting: Internal Medicine

## 2021-10-08 ENCOUNTER — Other Ambulatory Visit
Admission: RE | Admit: 2021-10-08 | Discharge: 2021-10-08 | Disposition: A | Discharge status: Home / Self Care | Payer: Medicaid Other | Attending: Internal Medicine | Admitting: Internal Medicine

## 2021-10-08 ENCOUNTER — Ambulatory Visit (INDEPENDENT_AMBULATORY_CARE_PROVIDER_SITE_OTHER): Payer: Medicaid Other | Admitting: Internal Medicine

## 2021-10-08 VITALS — BP 144/88 | HR 114 | Temp 98.9°F | Ht 60.0 in | Wt 186.0 lb

## 2021-10-08 DIAGNOSIS — R7303 Prediabetes: Secondary | ICD-10-CM

## 2021-10-08 DIAGNOSIS — G43009 Migraine without aura, not intractable, without status migrainosus: Secondary | ICD-10-CM | POA: Diagnosis not present

## 2021-10-08 DIAGNOSIS — I10 Essential (primary) hypertension: Secondary | ICD-10-CM

## 2021-10-08 LAB — BASIC METABOLIC PANEL
Anion gap: 10 (ref 5–15)
BUN: 10 mg/dL (ref 6–20)
CO2: 24 mmol/L (ref 22–32)
Calcium: 9.5 mg/dL (ref 8.9–10.3)
Chloride: 102 mmol/L (ref 98–111)
Creatinine, Ser: 0.61 mg/dL (ref 0.44–1.00)
GFR, Estimated: >60 mL/min (ref >60)
Glucose, Bld: 93 mg/dL (ref 70–99)
Potassium: 3.8 mmol/L (ref 3.5–5.1)
Sodium: 136 mmol/L (ref 135–145)

## 2021-10-08 MED ORDER — IBUPROFEN 600 MG PO TABS: 600.0000 mg | ORAL_TABLET | Freq: Four times a day (QID) | ORAL | 0 refills | Status: DC | PRN

## 2021-10-08 MED ORDER — LISINOPRIL 30 MG PO TABS: 30.0000 mg | ORAL_TABLET | Freq: Every day | ORAL | 1 refills | Status: DC

## 2021-10-08 MED ORDER — TRAMADOL HCL 50 MG PO TABS: 50.0000 mg | ORAL_TABLET | Freq: Every day | ORAL | 0 refills | Status: DC | PRN

## 2021-10-08 NOTE — Progress Notes (Signed)
Date:  10/08/2021   Name:  Amy Gomez   DOB:  11/03/1981   MRN:  [id number]   Chief Complaint: Hypertension, Diabetes, and Migraine  Hypertension This is a chronic problem. The problem has been waxing and waning since onset. The problem is controlled. Associated symptoms include headaches. Pertinent negatives include no chest pain, palpitations or shortness of breath. There are no associated agents to hypertension. Past treatments include ACE inhibitors. There are no compliance problems.   Migraine  This is a recurrent problem. The pain quality is similar to prior headaches. Pertinent negatives include no abdominal pain, coughing, dizziness or weakness. She has tried triptans and antidepressants (recently started seeing Neurology) for the symptoms. Her past medical history is significant for hypertension.  Diabetes She presents for her follow-up diabetic visit. Diabetes type: prediabetes. Her disease course has been stable. Hypoglycemia symptoms include headaches. Pertinent negatives for hypoglycemia include no dizziness or nervousness/anxiousness. Pertinent negatives for diabetes include no chest pain, no fatigue and no weakness.   Unable to take Effexor due to recurrent of urticaria.  She tried a second time with the same side effect.  Effexor helped her mood significantly and she would like to be able to take it.  She can not get to the Neurologist right now due to transportation issues.  Lab Results  Component Value Date   NA 137 12/17/2020   K 3.9 12/17/2020   CO2 21 (L) 12/17/2020   GLUCOSE 115 (H) 12/17/2020   BUN 12 12/17/2020   CREATININE 0.60 12/17/2020   CALCIUM 8.7 (L) 12/17/2020   GFRNONAA >60 12/17/2020   No results found for: CHOL, HDL, LDLCALC, LDLDIRECT, TRIG, CHOLHDL Lab Results  Component Value Date   TSH 0.954 04/24/2021   Lab Results  Component Value Date   HGBA1C 5.8 (H) 01/26/2019   Lab Results  Component Value Date   WBC 13.2 (H) 12/17/2020    HGB 13.5 12/17/2020   HCT 40.1 12/17/2020   MCV 83.9 12/17/2020   PLT 277 12/17/2020   Lab Results  Component Value Date   ALT 17 12/17/2020   AST 17 12/17/2020   ALKPHOS 69 12/17/2020   BILITOT 0.6 12/17/2020   No results found for: 25OHVITD2, 25OHVITD3, VD25OH   Review of Systems  Constitutional:  Negative for fatigue and unexpected weight change.  HENT:  Negative for nosebleeds.   Eyes:  Negative for visual disturbance.  Respiratory:  Negative for cough, chest tightness, shortness of breath and wheezing.   Cardiovascular:  Negative for chest pain, palpitations and leg swelling.  Gastrointestinal:  Negative for abdominal pain, constipation and diarrhea.  Skin:  Positive for rash (urticaria comes and goes).  Neurological:  Positive for headaches. Negative for dizziness, weakness and light-headedness.  Psychiatric/Behavioral:  Negative for dysphoric mood and sleep disturbance. The patient is not nervous/anxious.    Patient Active Problem List   Diagnosis Date Noted   Left cervical radiculopathy 02/04/2021   Supraspinatus tendinitis, left 02/04/2021   Cervical paraspinal muscle spasm 02/04/2021   Rosacea 01/09/2020   Panic disorder with agoraphobia 07/11/2019   Pre-diabetes 01/26/2019   Liver mass 01/25/2019   Essential hypertension 07/15/2017   Gastroesophageal reflux disease without esophagitis 12/18/2016   History of kidney stones 06/04/2015   Dysmenorrhea 06/04/2015   Migraine without aura and without status migrainosus, not intractable 06/04/2015    Allergies  Allergen Reactions   Sulfa Antibiotics Anaphylaxis, Swelling, Rash and Other (See Comments)   Azithromycin Rash    Rash as  a teenager, "Z Pack"    Past Surgical History:  Procedure Laterality Date   DILATION AND CURETTAGE OF UTERUS     TUBAL LIGATION Bilateral 2007    Social History   Tobacco Use   Smoking status: Every Day    Packs/day: 0.50    Years: 24.00    Pack years: 12.00    Types:  Cigarettes    Start date: 1996   Smokeless tobacco: Never  Vaping Use   Vaping Use: Never used  Substance Use Topics   Alcohol use: No    Alcohol/week: 0.0 standard drinks   Drug use: No     Medication list has been reviewed and updated.  Current Meds  Medication Sig   lisinopril (ZESTRIL) 20 MG tablet Take 1 tablet (20 mg total) by mouth daily.   rizatriptan (MAXALT-MLT) 10 MG disintegrating tablet Take by mouth.   traMADol (ULTRAM) 50 MG tablet Take 1 tablet (50 mg total) by mouth daily as needed (migraine HA).    PHQ 2/9 Scores 10/08/2021 07/08/2021 05/20/2021 03/11/2021  PHQ - 2 Score 0 0 1 0  PHQ- 9 Score 7 7 8  0    GAD 7 : Generalized Anxiety Score 10/08/2021 07/08/2021 05/20/2021 03/11/2021  Nervous, Anxious, on Edge 2 1 2 3   Control/stop worrying 1 1 0 3  Worry too much - different things 1 1 3 2   Trouble relaxing 1 1 3  0  Restless 2 0 0 0  Easily annoyed or irritable 0 1 1 1   Afraid - awful might happen 1 1 0 0  Total GAD 7 Score 8 6 9 9   Anxiety Difficulty Somewhat difficult Not difficult at all Not difficult at all Not difficult at all    BP Readings from Last 3 Encounters:  10/08/21 (!) 144/88  07/08/21 (!) 146/96  06/25/21 (!) 160/106    Physical Exam Vitals and nursing note reviewed.  Constitutional:      General: She is not in acute distress.    Appearance: She is well-developed.  HENT:     Head: Normocephalic and atraumatic.  Cardiovascular:     Rate and Rhythm: Normal rate and regular rhythm.     Pulses: Normal pulses.  Pulmonary:     Effort: Pulmonary effort is normal. No respiratory distress.     Breath sounds: No wheezing or rhonchi.  Musculoskeletal:     Right lower leg: No edema.     Left lower leg: No edema.  Skin:    General: Skin is warm and dry.     Findings: No rash.  Neurological:     Mental Status: She is alert and oriented to person, place, and time.  Psychiatric:        Mood and Affect: Mood normal.        Behavior: Behavior  normal.    Wt Readings from Last 3 Encounters:  10/08/21 186 lb (84.4 kg)  07/08/21 190 lb (86.2 kg)  06/25/21 190 lb (86.2 kg)    BP (!) 144/88   Pulse (!) 114   Temp 98.9 F (37.2 C) (Oral)   Ht 5' (1.524 m)   Wt 186 lb (84.4 kg)   SpO2 98%   BMI 36.33 kg/m   Assessment and Plan: 1. Essential hypertension Not well controlled on 20 mg.  Will increase dose to 30 mg and recheck in 2 months. Avoid excessive intake of caffeined beverages. - Basic metabolic panel - lisinopril (ZESTRIL) 30 MG tablet; Take 1 tablet (30 mg total)  by mouth daily.  Dispense: 90 tablet; Refill: 1  2. Migraine without aura and without status migrainosus, not intractable Can try Effexor qod but stop if hives return Will refill Advil and Tramadol to use PRN until she can return to Neurology for approval of Ajovy. - ibuprofen (ADVIL) 600 MG tablet; Take 1 tablet (600 mg total) by mouth every 6 (six) hours as needed.  Dispense: 120 tablet; Refill: 0  3. Pre-diabetes Has not been check in several years. She continues to work on diet and weight loss. - Hemoglobin A1c   Partially dictated using Editor, commissioning. Any errors are unintentional.  Halina Maidens, MD [facility]  10/08/2021

## 2021-10-09 LAB — HEMOGLOBIN A1C
Hgb A1c MFr Bld: 5.9 % — ABNORMAL HIGH (ref 4.8–5.6)
Mean Plasma Glucose: 123 mg/dL

## 2021-10-21 ENCOUNTER — Encounter: Payer: Self-pay | Admitting: Internal Medicine

## 2021-10-25 ENCOUNTER — Encounter: Payer: Self-pay | Admitting: Internal Medicine

## 2021-10-30 ENCOUNTER — Ambulatory Visit: Payer: Medicaid Other | Admitting: Internal Medicine

## 2021-11-19 ENCOUNTER — Ambulatory Visit: Payer: Medicaid Other | Admitting: Internal Medicine

## 2021-11-22 ENCOUNTER — Other Ambulatory Visit: Payer: Self-pay | Admitting: Internal Medicine

## 2021-11-22 DIAGNOSIS — G43009 Migraine without aura, not intractable, without status migrainosus: Secondary | ICD-10-CM

## 2021-11-22 MED ORDER — TRAMADOL HCL 50 MG PO TABS: 50.0000 mg | ORAL_TABLET | Freq: Every day | ORAL | 0 refills | Status: DC | PRN

## 2021-11-22 NOTE — Telephone Encounter (Signed)
Refill if appropriate.  Please advise.  

## 2021-12-09 ENCOUNTER — Other Ambulatory Visit: Payer: Self-pay

## 2021-12-09 ENCOUNTER — Ambulatory Visit (INDEPENDENT_AMBULATORY_CARE_PROVIDER_SITE_OTHER): Payer: Medicaid Other | Admitting: Internal Medicine

## 2021-12-09 ENCOUNTER — Encounter: Payer: Self-pay | Admitting: Internal Medicine

## 2021-12-09 VITALS — BP 110/58 | HR 99 | Ht 60.0 in | Wt 188.0 lb

## 2021-12-09 DIAGNOSIS — L508 Other urticaria: Secondary | ICD-10-CM | POA: Diagnosis not present

## 2021-12-09 DIAGNOSIS — R7303 Prediabetes: Secondary | ICD-10-CM

## 2021-12-09 DIAGNOSIS — I10 Essential (primary) hypertension: Secondary | ICD-10-CM | POA: Diagnosis not present

## 2021-12-09 DIAGNOSIS — G43009 Migraine without aura, not intractable, without status migrainosus: Secondary | ICD-10-CM

## 2021-12-09 NOTE — Progress Notes (Signed)
Date:  12/09/2021   Name:  Amy Gomez   DOB:  1981/03/12   MRN:  [id number]   Chief Complaint: Hypertension  Hypertension This is a chronic problem. The problem is controlled. Associated symptoms include headaches. Pertinent negatives include no chest pain or shortness of breath. Past treatments include ACE inhibitors and beta blockers. The current treatment provides significant improvement.  Migraine  This is a recurrent problem. The pain quality is similar to prior headaches. Pertinent negatives include no abdominal pain, coughing, dizziness or fever. She has tried beta blockers (currently on propranolol from Neurology; uses tramadol PRN) for the symptoms. Her past medical history is significant for hypertension.  Urticaria This is a recurrent problem. The problem has been waxing and waning since onset. The rash is diffuse. The rash is characterized by itchiness and redness. She was exposed to nothing. Pertinent negatives include no cough, fatigue, fever or shortness of breath.  Weight - she is concerned that she is not losing weight.  She does not eat any meals for several days, but does nibble sometimes and then gets very shaking and feels weak.  Her appetite comes and goes and she report anorexia in her teens that improved after she became pregnant.  She does not understand why she is not losing.  Lab Results  Component Value Date   NA 136 10/08/2021   K 3.8 10/08/2021   CO2 24 10/08/2021   GLUCOSE 93 10/08/2021   BUN 10 10/08/2021   CREATININE 0.61 10/08/2021   CALCIUM 9.5 10/08/2021   GFRNONAA >60 10/08/2021   No results found for: CHOL, HDL, LDLCALC, LDLDIRECT, TRIG, CHOLHDL Lab Results  Component Value Date   TSH 0.954 04/24/2021   Lab Results  Component Value Date   HGBA1C 5.9 (H) 10/08/2021   Lab Results  Component Value Date   WBC 13.2 (H) 12/17/2020   HGB 13.5 12/17/2020   HCT 40.1 12/17/2020   MCV 83.9 12/17/2020   PLT 277 12/17/2020   Lab Results   Component Value Date   ALT 17 12/17/2020   AST 17 12/17/2020   ALKPHOS 69 12/17/2020   BILITOT 0.6 12/17/2020   No results found for: 25OHVITD2, 25OHVITD3, VD25OH   Review of Systems  Constitutional:  Negative for chills, fatigue, fever and unexpected weight change.  Respiratory:  Negative for cough, chest tightness, shortness of breath and wheezing.   Cardiovascular:  Negative for chest pain.  Gastrointestinal:  Negative for abdominal pain and constipation.  Neurological:  Positive for headaches. Negative for dizziness and light-headedness.  Psychiatric/Behavioral:  Negative for sleep disturbance. The patient is not nervous/anxious.    Patient Active Problem List   Diagnosis Date Noted   Left cervical radiculopathy 02/04/2021   Supraspinatus tendinitis, left 02/04/2021   Cervical paraspinal muscle spasm 02/04/2021   Rosacea 01/09/2020   Panic disorder with agoraphobia 07/11/2019   Pre-diabetes 01/26/2019   Liver mass 01/25/2019   Essential hypertension 07/15/2017   Gastroesophageal reflux disease without esophagitis 12/18/2016   History of kidney stones 06/04/2015   Dysmenorrhea 06/04/2015   Migraine without aura and without status migrainosus, not intractable 06/04/2015    Allergies  Allergen Reactions   Sulfa Antibiotics Anaphylaxis, Swelling, Rash and Other (See Comments)   Azithromycin Rash    Rash as a teenager, "Z Pack"    Past Surgical History:  Procedure Laterality Date   DILATION AND CURETTAGE OF UTERUS     TUBAL LIGATION Bilateral 2007    Social History   Tobacco  Use   Smoking status: Every Day    Packs/day: 0.50    Years: 24.00    Pack years: 12.00    Types: Cigarettes    Start date: 1996   Smokeless tobacco: Never  Vaping Use   Vaping Use: Never used  Substance Use Topics   Alcohol use: No    Alcohol/week: 0.0 standard drinks   Drug use: No     Medication list has been reviewed and updated.  Current Meds  Medication Sig   ibuprofen  (ADVIL) 600 MG tablet Take 1 tablet (600 mg total) by mouth every 6 (six) hours as needed.   lisinopril (ZESTRIL) 30 MG tablet Take 1 tablet (30 mg total) by mouth daily.   propranolol (INDERAL) 20 MG tablet Take by mouth.   traMADol (ULTRAM) 50 MG tablet Take 1 tablet (50 mg total) by mouth daily as needed (migraine HA).   [DISCONTINUED] rizatriptan (MAXALT-MLT) 10 MG disintegrating tablet Take by mouth.    PHQ 2/9 Scores 12/09/2021 10/08/2021 07/08/2021 05/20/2021  PHQ - 2 Score 0 0 0 1  PHQ- 9 Score 5 7 7 8     GAD 7 : Generalized Anxiety Score 12/09/2021 10/08/2021 07/08/2021 05/20/2021  Nervous, Anxious, on Edge 1 2 1 2   Control/stop worrying 1 1 1  0  Worry too much - different things 1 1 1 3   Trouble relaxing 1 1 1 3   Restless 1 2 0 0  Easily annoyed or irritable 1 0 1 1  Afraid - awful might happen 1 1 1  0  Total GAD 7 Score 7 8 6 9   Anxiety Difficulty - Somewhat difficult Not difficult at all Not difficult at all    BP Readings from Last 3 Encounters:  12/09/21 (!) 110/58  10/08/21 (!) 144/88  07/08/21 (!) 146/96    Physical Exam Vitals and nursing note reviewed.  Constitutional:      General: She is not in acute distress.    Appearance: Normal appearance. She is well-developed.  HENT:     Head: Normocephalic and atraumatic.  Neck:     Vascular: No carotid bruit.  Cardiovascular:     Rate and Rhythm: Normal rate and regular rhythm.     Pulses: Normal pulses.     Heart sounds: No murmur heard. Pulmonary:     Effort: Pulmonary effort is normal. No respiratory distress.     Breath sounds: No wheezing or rhonchi.  Musculoskeletal:     Cervical back: Normal range of motion.     Right lower leg: No edema.     Left lower leg: No edema.  Lymphadenopathy:     Cervical: No cervical adenopathy.  Skin:    General: Skin is warm and dry.     Capillary Refill: Capillary refill takes less than 2 seconds.     Findings: No rash.  Neurological:     General: No focal deficit  present.     Mental Status: She is alert and oriented to person, place, and time.  Psychiatric:        Mood and Affect: Mood normal.        Behavior: Behavior normal.    Wt Readings from Last 3 Encounters:  12/09/21 188 lb (85.3 kg)  10/08/21 186 lb (84.4 kg)  07/08/21 190 lb (86.2 kg)    BP (!) 110/58    Pulse 99    Ht 5' (1.524 m)    Wt 188 lb (85.3 kg)    SpO2 98%    BMI  36.72 kg/m   Assessment and Plan: 1. Essential hypertension Clinically stable exam with well controlled BP. Tolerating medications without side effects at this time. Pt to continue current regimen and low sodium diet; benefits of regular exercise as able discussed.  2. Migraine without aura and without status migrainosus, not intractable Headaches slightly improved. Continue Propranolol from Neurology  3. Pre-diabetes Diet changes such as intermittent fasting recommended. She needs to eat one balanced meal per day and eliminate all soda and caffeine containing energy drinks.   Partially dictated using Animal nutritionistDragon software. Any errors are unintentional.  Bari EdwardLaura Marcel Gary, MD [facility]  12/09/2021

## 2021-12-17 ENCOUNTER — Other Ambulatory Visit: Payer: Self-pay | Admitting: Internal Medicine

## 2021-12-17 DIAGNOSIS — G43009 Migraine without aura, not intractable, without status migrainosus: Secondary | ICD-10-CM

## 2021-12-18 ENCOUNTER — Encounter: Payer: Self-pay | Admitting: Internal Medicine

## 2021-12-22 ENCOUNTER — Encounter: Payer: Self-pay | Admitting: Internal Medicine

## 2021-12-23 ENCOUNTER — Other Ambulatory Visit: Payer: Self-pay | Admitting: Internal Medicine

## 2021-12-23 DIAGNOSIS — G43009 Migraine without aura, not intractable, without status migrainosus: Secondary | ICD-10-CM

## 2021-12-23 MED ORDER — TRAMADOL HCL 50 MG PO TABS: 50.0000 mg | ORAL_TABLET | Freq: Every day | ORAL | 0 refills | Status: DC | PRN

## 2022-01-07 ENCOUNTER — Encounter: Payer: Self-pay | Admitting: Internal Medicine

## 2022-01-13 ENCOUNTER — Encounter: Payer: Self-pay | Admitting: Internal Medicine

## 2022-01-13 ENCOUNTER — Other Ambulatory Visit: Payer: Self-pay

## 2022-01-13 MED ORDER — ONDANSETRON HCL 4 MG PO TABS: 4.0000 mg | ORAL_TABLET | Freq: Three times a day (TID) | ORAL | 0 refills | Status: DC | PRN

## 2022-01-20 ENCOUNTER — Other Ambulatory Visit: Payer: Self-pay | Admitting: Internal Medicine

## 2022-01-20 DIAGNOSIS — G43009 Migraine without aura, not intractable, without status migrainosus: Secondary | ICD-10-CM

## 2022-01-20 NOTE — Telephone Encounter (Signed)
Please review. Last office visit 12/09/2021. ? ?KP

## 2022-01-21 ENCOUNTER — Other Ambulatory Visit: Payer: Self-pay | Admitting: Internal Medicine

## 2022-01-21 DIAGNOSIS — G43009 Migraine without aura, not intractable, without status migrainosus: Secondary | ICD-10-CM

## 2022-01-21 MED ORDER — TRAMADOL HCL 50 MG PO TABS: 50.0000 mg | ORAL_TABLET | Freq: Every day | ORAL | 0 refills | Status: DC | PRN

## 2022-02-18 ENCOUNTER — Encounter: Payer: Self-pay | Admitting: Internal Medicine

## 2022-02-19 ENCOUNTER — Ambulatory Visit
Admission: EM | Admit: 2022-02-19 | Discharge: 2022-02-19 | Disposition: A | Discharge status: Home / Self Care | Payer: Medicaid Other | Attending: Emergency Medicine | Admitting: Emergency Medicine

## 2022-02-19 ENCOUNTER — Ambulatory Visit: Payer: Medicaid Other | Admitting: Internal Medicine

## 2022-02-19 DIAGNOSIS — N611 Abscess of the breast and nipple: Secondary | ICD-10-CM

## 2022-02-19 MED ORDER — DOXYCYCLINE HYCLATE 100 MG PO CAPS: 100.0000 mg | ORAL_CAPSULE | Freq: Two times a day (BID) | ORAL | 0 refills | Status: DC

## 2022-02-19 MED ORDER — OXYCODONE HCL 5 MG PO TABS: 5.0000 mg | ORAL_TABLET | Freq: Four times a day (QID) | ORAL | 0 refills | Status: AC | PRN

## 2022-02-19 NOTE — ED Provider Notes (Signed)
?[facility] ? ? ? ?CSN: [id number] ?Arrival date & time: 02/19/22  1255 ? ? ?  ? ?History   ?Chief Complaint ?Chief Complaint  ?Patient presents with  ? Abscess  ? Breast Pain  ? ? ?HPI ?Amy Gomez is a 41 y.o. female.  ? ?Presents with abscess underneath the right breast for 3 days.  Site has increased in size and tenderness.  Pain is worsened with movement.  Endorses that spontaneous drainage occurred yesterday described as a dark blackish color.  Place Neosporin and a bandage over the area 1 day ago.  Denies fever, chills. ? ?Past Medical History:  ?Diagnosis Date  ? Hypertension   ? Kidney stones   ? Leukocytosis 08/16/2018  ? Migraine   ? ? ?Patient Active Problem List  ? Diagnosis Date Noted  ? Recurrent periodic urticaria 12/09/2021  ? Left cervical radiculopathy 02/04/2021  ? Supraspinatus tendinitis, left 02/04/2021  ? Cervical paraspinal muscle spasm 02/04/2021  ? Rosacea 01/09/2020  ? Panic disorder with agoraphobia 07/11/2019  ? Pre-diabetes 01/26/2019  ? Liver mass 01/25/2019  ? Essential hypertension 07/15/2017  ? Gastroesophageal reflux disease without esophagitis 12/18/2016  ? History of kidney stones 06/04/2015  ? Dysmenorrhea 06/04/2015  ? Migraine without aura and without status migrainosus, not intractable 06/04/2015  ? ? ?Past Surgical History:  ?Procedure Laterality Date  ? DILATION AND CURETTAGE OF UTERUS    ? TUBAL LIGATION Bilateral 2007  ? ? ?OB History   ?No obstetric history on file. ?  ? ? ? ?Home Medications   ? ?Prior to Admission medications   ?Medication Sig Start Date End Date Taking? Authorizing Provider  ?doxycycline (VIBRAMYCIN) 100 MG capsule Take 1 capsule (100 mg total) by mouth 2 (two) times daily. 02/19/22  Yes Indiana Gamero, Elita Boone, NP  ?oxyCODONE (ROXICODONE) 5 MG immediate release tablet Take 1 tablet (5 mg total) by mouth every 6 (six) hours as needed for up to 3 days for severe pain. 02/19/22 02/22/22 Yes Larcenia Holaday, Elita Boone, NP  ?ibuprofen (ADVIL) 600 MG  tablet Take 1 tablet (600 mg total) by mouth every 6 (six) hours as needed. 10/08/21   Reubin Milan, MD  ?lisinopril (ZESTRIL) 30 MG tablet Take 1 tablet (30 mg total) by mouth daily. 10/08/21   Reubin Milan, MD  ?meloxicam (MOBIC) 15 MG tablet Take 15 mg by mouth daily as needed. 10/21/21   [provider]  ?ondansetron (ZOFRAN) 4 MG tablet Take 1 tablet (4 mg total) by mouth every 8 (eight) hours as needed for nausea or vomiting. 01/13/22   Reubin Milan, MD  ?propranolol (INDERAL) 20 MG tablet Take by mouth. 12/05/21   [provider]  ?traMADol (ULTRAM) 50 MG tablet Take 1 tablet (50 mg total) by mouth daily as needed (migraine HA). 01/21/22   Reubin Milan, MD  ?Ubrogepant 50 MG TABS Take 50mg  at headache onset. Can repeat after 2 hours if needed. Do not exceed 200mg  in 24 hours. 01/06/22   [provider]  ?diphenhydrAMINE HCl (BENADRYL PO) Take by mouth as needed.  09/03/20  [provider]  ?omeprazole (PRILOSEC) 10 MG capsule Take 1 capsule (10 mg total) by mouth daily. ?Patient not taking: No sig reported 09/24/19 12/06/19  Cuthriell, 09/26/19, PA-C  ? ? ?Family History ?Family History  ?Problem Relation Age of Onset  ? Other Mother   ?     unknown medical history  ? Other Father   ?  unknown medical hisotry  ? Endometriosis Maternal Grandmother   ? ? ?Social History ?Social History  ? ?Tobacco Use  ? Smoking status: Every Day  ?  Packs/day: 0.50  ?  Years: 24.00  ?  Pack years: 12.00  ?  Types: Cigarettes  ?  Start date: 21996  ? Smokeless tobacco: Never  ?Vaping Use  ? Vaping Use: Never used  ?Substance Use Topics  ? Alcohol use: No  ?  Alcohol/week: 0.0 standard drinks  ? Drug use: No  ? ? ? ?Allergies   ?Sulfa antibiotics and Azithromycin ? ? ?Review of Systems ?Review of Systems  ?Constitutional: Negative.   ?Respiratory: Negative.    ?Cardiovascular: Negative.   ?Skin:  Positive for wound. Negative for color change, pallor and rash.  ?Neurological:  Negative.   ? ? ?Physical Exam ?Triage Vital Signs ?ED Triage Vitals  ?Enc Vitals Group  ?   BP 02/19/22 1324 121/80  ?   Pulse Rate 02/19/22 1324 99  ?   Resp 02/19/22 1324 16  ?   Temp 02/19/22 1324 98.4 ?F (36.9 ?C)  ?   Temp Source 02/19/22 1324 Oral  ?   SpO2 02/19/22 1324 100 %  ?   Weight --   ?   Height --   ?   Head Circumference --   ?   Peak Flow --   ?   Pain Score 02/19/22 1323 6  ?   Pain Loc --   ?   Pain Edu? --   ?   Excl. in GC? --   ? ?No data found. ? ?Updated Vital Signs ?BP 121/80 (BP Location: Left Arm)   Pulse 99   Temp 98.4 ?F (36.9 ?C) (Oral)   Resp 16   LMP 02/12/2022   SpO2 100%  ? ?Visual Acuity ?Right Eye Distance:   ?Left Eye Distance:   ?Bilateral Distance:   ? ?Right Eye Near:   ?Left Eye Near:    ?Bilateral Near:    ? ?Physical Exam ?Constitutional:   ?   Appearance: Normal appearance.  ?HENT:  ?   Head: Normocephalic.  ?Eyes:  ?   Extraocular Movements: Extraocular movements intact.  ?Skin: ?   Comments: 2 x 3 cm immature abscess noted under the right breast along the bra line, scant bloody drainage noted, tenderness noted  ?Neurological:  ?   Mental Status: She is alert and oriented to person, place, and time. Mental status is at baseline.  ?Psychiatric:     ?   Mood and Affect: Mood normal.     ?   Behavior: Behavior normal.  ? ? ? ?UC Treatments / Results  ?Labs ?(all labs ordered are listed, but only abnormal results are displayed) ?Labs Reviewed - No data to display ? ?EKG ? ? ?Radiology ?No results found. ? ?Procedures ?Procedures (including critical care time) ? ?Medications Ordered in UC ?Medications - No data to display ? ?Initial Impression / Assessment and Plan / UC Course  ?I have reviewed the triage vital signs and the nursing notes. ? ?Pertinent labs & imaging results that were available during my care of the patient were reviewed by me and considered in my medical decision making (see chart for details). ? ?Abscess of right breast ? ?Site has begun to drain,  will add on antibiotic,doxycyline 7 day course prescribed, recommended warm compresses in addition to facilitate draining, prescribed oxy IR for management of pain, PDMP reviewed, moderate risk, 12 tablets to be dispensed,  may follow up for non healing site,  ?Final Clinical Impressions(s) / UC Diagnoses  ? ?Final diagnoses:  ?Abscess of right breast  ? ? ? ?Discharge Instructions   ? ?  ?Take doxycycline twice daily for 7 days ? ?Hold warm-hot compresses to affected area at least 4 times a day, this helps to facilitate draining, the more the better ? ?Please return for evaluation for increased swelling, increased tenderness or pain, non healing site, non draining site, you begin to have fever or chills  ? ?We reviewed the etiology of recurrent abscesses of skin.  Skin abscesses are collections of pus within the dermis and deeper skin tissues. Skin abscesses manifest as painful, tender, fluctuant, and erythematous nodules, frequently surmounted by a pustule and surrounded by a rim of erythematous swelling.  Spontaneous drainage of purulent material may occur.  Fever can occur on occasion.   ? ?-Skin abscesses can develop in healthy individuals with no predisposing conditions other than skin or nasal carriage of Staphylococcus aureus.  Individuals in close contact with others who have active infection with skin abscesses are at increased risk which is likely to explain why twin brother has similar episodes.   In addition, any process leading to a breach in the skin barrier can also predispose to the development of a skin abscesses, such as atopic dermatitis.    ? ? ?ED Prescriptions   ? ? Medication Sig Dispense Auth. Provider  ? doxycycline (VIBRAMYCIN) 100 MG capsule Take 1 capsule (100 mg total) by mouth 2 (two) times daily. 14 capsule Zykeriah Mathia R, NP  ? oxyCODONE (ROXICODONE) 5 MG immediate release tablet Take 1 tablet (5 mg total) by mouth every 6 (six) hours as needed for up to 3 days for severe pain. 12  tablet Valinda Hoar, NP  ? ?  ? ?I have reviewed the PDMP during this encounter. ?  ?Valinda Hoar, NP ?02/19/22 1444 ? ?

## 2022-02-19 NOTE — ED Triage Notes (Signed)
Patient presents to Urgent Care with complaints of abscess located on her right breast pain since Monday.  Drainage is blackish color. Treating with neosporin and bandage. She is unsure if spider bite or abscess. ? ?Denies fever.  ?

## 2022-02-19 NOTE — Discharge Instructions (Addendum)
Take doxycycline twice daily for 7 days ? ?Hold warm-hot compresses to affected area at least 4 times a day, this helps to facilitate draining, the more the better ? ?Please return for evaluation for increased swelling, increased tenderness or pain, non healing site, non draining site, you begin to have fever or chills  ? ?We reviewed the etiology of recurrent abscesses of skin.  Skin abscesses are collections of pus within the dermis and deeper skin tissues. Skin abscesses manifest as painful, tender, fluctuant, and erythematous nodules, frequently surmounted by a pustule and surrounded by a rim of erythematous swelling.  Spontaneous drainage of purulent material may occur.  Fever can occur on occasion.   ? ?-Skin abscesses can develop in healthy individuals with no predisposing conditions other than skin or nasal carriage of Staphylococcus aureus.  Individuals in close contact with others who have active infection with skin abscesses are at increased risk which is likely to explain why twin brother has similar episodes.   In addition, any process leading to a breach in the skin barrier can also predispose to the development of a skin abscesses, such as atopic dermatitis.    ?

## 2022-02-20 ENCOUNTER — Ambulatory Visit (INDEPENDENT_AMBULATORY_CARE_PROVIDER_SITE_OTHER): Payer: Medicaid Other | Admitting: Obstetrics and Gynecology

## 2022-02-20 ENCOUNTER — Encounter: Payer: Self-pay | Admitting: Obstetrics and Gynecology

## 2022-02-20 DIAGNOSIS — N920 Excessive and frequent menstruation with regular cycle: Secondary | ICD-10-CM

## 2022-02-20 DIAGNOSIS — N921 Excessive and frequent menstruation with irregular cycle: Secondary | ICD-10-CM | POA: Diagnosis not present

## 2022-02-20 DIAGNOSIS — N946 Dysmenorrhea, unspecified: Secondary | ICD-10-CM | POA: Diagnosis not present

## 2022-02-20 NOTE — Progress Notes (Signed)
HPI: ?     [name] is a 41 y.o. [phone] who LMP was Patient's last menstrual period was 02/12/2022. ? ?Subjective:  ? ?She presents today stating that use of Slynd OCPs has not given her adequate cycle control of her heavy menstrual bleeding.  She was inquiring about the possibility of endometrial ablation.  At her last visit we spoke in detail about methods of cycle control.  She was opposed to IUD at that time but has since had second thoughts and would consider IUD. ? ?  Hx: ?The following portions of the patient's history were reviewed and updated as appropriate: ?            She  has a past medical history of Hypertension, Kidney stones, Leukocytosis (08/16/2018), and Migraine. ?She does not have any pertinent problems on file. ?She  has a past surgical history that includes Tubal ligation (Bilateral, 2007) and Dilation and curettage of uterus. ?Her family history includes Endometriosis in her maternal grandmother; Other in her father and mother. ?She  reports that she has been smoking cigarettes. She started smoking about 27 years ago. She has a 12.00 pack-year smoking history. She has never used smokeless tobacco. She reports that she does not drink alcohol and does not use drugs. ?She has a current medication list which includes the following prescription(s): doxycycline, ibuprofen, lisinopril, oxycodone, propranolol, tramadol, [DISCONTINUED] diphenhydramine hcl, and [DISCONTINUED] omeprazole. ?She is allergic to sulfa antibiotics and azithromycin. ?      ?Review of Systems:  ?Review of Systems ? ?Constitutional: Denied constitutional symptoms, night sweats, recent illness, fatigue, fever, insomnia and weight loss.  ?Eyes: Denied eye symptoms, eye pain, photophobia, vision change and visual disturbance.  ?Ears/Nose/Throat/Neck: Denied ear, nose, throat or neck symptoms, hearing loss, nasal discharge, sinus congestion and sore throat.  ?Cardiovascular: Denied cardiovascular symptoms,  arrhythmia, chest pain/pressure, edema, exercise intolerance, orthopnea and palpitations.  ?Respiratory: Denied pulmonary symptoms, asthma, pleuritic pain, productive sputum, cough, dyspnea and wheezing.  ?Gastrointestinal: Denied, gastro-esophageal reflux, melena, nausea and vomiting.  ?Genitourinary: See HPI for additional information.  ?Musculoskeletal: Denied musculoskeletal symptoms, stiffness, swelling, muscle weakness and myalgia.  ?Dermatologic: Denied dermatology symptoms, rash and scar.  ?Neurologic: Denied neurology symptoms, dizziness, headache, neck pain and syncope.  ?Psychiatric: Denied psychiatric symptoms, anxiety and depression.  ?Endocrine: Denied endocrine symptoms including hot flashes and night sweats.  ? ?Meds: ?  ?Current Outpatient Medications on File Prior to Visit  ?Medication Sig Dispense Refill  ? doxycycline (VIBRAMYCIN) 100 MG capsule Take 1 capsule (100 mg total) by mouth 2 (two) times daily. 14 capsule 0  ? ibuprofen (ADVIL) 600 MG tablet Take 1 tablet (600 mg total) by mouth every 6 (six) hours as needed. 120 tablet 0  ? lisinopril (ZESTRIL) 30 MG tablet Take 1 tablet (30 mg total) by mouth daily. 90 tablet 1  ? oxyCODONE (ROXICODONE) 5 MG immediate release tablet Take 1 tablet (5 mg total) by mouth every 6 (six) hours as needed for up to 3 days for severe pain. 12 tablet 0  ? propranolol (INDERAL) 20 MG tablet Take by mouth.    ? traMADol (ULTRAM) 50 MG tablet Take 1 tablet (50 mg total) by mouth daily as needed (migraine HA). 15 tablet 0  ? [DISCONTINUED] diphenhydrAMINE HCl (BENADRYL PO) Take by mouth as needed.    ? [DISCONTINUED] omeprazole (PRILOSEC) 10 MG capsule Take 1 capsule (10 mg total) by mouth daily. (Patient not taking: No sig reported) 30 capsule 0  ? ?No  current facility-administered medications on file prior to visit.  ? ? ? ? ?Objective:  ?  ? ?There were no vitals filed for this visit. ?There were no vitals filed for this visit. ?  ?          ?         ? ?Assessment:  ?  ?[id number] ?Patient Active Problem List  ? Diagnosis Date Noted  ? Recurrent periodic urticaria 12/09/2021  ? Left cervical radiculopathy 02/04/2021  ? Supraspinatus tendinitis, left 02/04/2021  ? Cervical paraspinal muscle spasm 02/04/2021  ? Rosacea 01/09/2020  ? Panic disorder with agoraphobia 07/11/2019  ? Pre-diabetes 01/26/2019  ? Liver mass 01/25/2019  ? Essential hypertension 07/15/2017  ? Gastroesophageal reflux disease without esophagitis 12/18/2016  ? History of kidney stones 06/04/2015  ? Dysmenorrhea 06/04/2015  ? Migraine without aura and without status migrainosus, not intractable 06/04/2015  ? ?  ?1. Menorrhagia with irregular cycle   ?2. Dysmenorrhea   ? ? Failed progesterone only pills for cycle control. ? ? ?Plan:  ?  ?       ? 1.  We have discussed methods for cycle control.  Because she inquired I have specifically discussed endometrial ablation in the face of tubal ligation.  PATSS and its 10% recurrence rate was discussed.  Patient is sure that she does not want this anymore. ?We have again discussed IUD and she thinks that she would like to try IUD. ?Orders ?No orders of the defined types were placed in this encounter. ? ? No orders of the defined types were placed in this encounter. ?  ?  F/U ? Return for Next Scheduled Follow-up. ?I spent 21 minutes involved in the care of this patient preparing to see the patient by obtaining and reviewing her medical history (including labs, imaging tests and prior procedures), documenting clinical information in the electronic health record (EHR), counseling and coordinating care plans, writing and sending prescriptions, ordering tests or procedures and in direct communicating with the patient and medical staff discussing pertinent items from her history and physical exam. ? ?Elonda Husky, M.D. ?02/20/2022 ?1:20 PM ? ? ? ? ?

## 2022-02-20 NOTE — Progress Notes (Signed)
Patient presents today due to menorrhagia with irregular cycle and to discuss an ablation. She states her cycles are very heavy, painful and irregular in length of time. She states she has tried OCP's and they did not help, patient has a BTL. Patient states no other questions or concerns at this time.  ?

## 2022-02-21 ENCOUNTER — Encounter: Payer: Self-pay | Admitting: Obstetrics and Gynecology

## 2022-03-04 ENCOUNTER — Encounter: Payer: Self-pay | Admitting: Internal Medicine

## 2022-03-11 ENCOUNTER — Ambulatory Visit: Payer: Medicaid Other | Admitting: Internal Medicine

## 2022-03-12 ENCOUNTER — Ambulatory Visit: Payer: Medicaid Other | Admitting: Internal Medicine

## 2022-03-12 ENCOUNTER — Encounter: Payer: Self-pay | Admitting: Internal Medicine

## 2022-03-12 ENCOUNTER — Ambulatory Visit (INDEPENDENT_AMBULATORY_CARE_PROVIDER_SITE_OTHER): Payer: Medicaid Other | Admitting: Internal Medicine

## 2022-03-12 VITALS — BP 118/70 | HR 82 | Ht 60.0 in | Wt 189.0 lb

## 2022-03-12 DIAGNOSIS — G43009 Migraine without aura, not intractable, without status migrainosus: Secondary | ICD-10-CM | POA: Diagnosis not present

## 2022-03-12 DIAGNOSIS — F332 Major depressive disorder, recurrent severe without psychotic features: Secondary | ICD-10-CM | POA: Diagnosis not present

## 2022-03-12 DIAGNOSIS — I10 Essential (primary) hypertension: Secondary | ICD-10-CM | POA: Diagnosis not present

## 2022-03-12 MED ORDER — LISINOPRIL 30 MG PO TABS: 30.0000 mg | ORAL_TABLET | Freq: Every day | ORAL | 1 refills | Status: DC

## 2022-03-12 MED ORDER — PROPRANOLOL HCL 20 MG PO TABS: 20.0000 mg | ORAL_TABLET | Freq: Every day | ORAL | 1 refills | Status: DC

## 2022-03-12 NOTE — Progress Notes (Signed)
? ? ?Date:  03/12/2022  ? ?Name:  Amy Gomez   DOB:  Apr 02, 1981   MRN:  [id number] ? ? ?Chief Complaint: Hypertension, Depression, and Anxiety ? ?Hypertension ?This is a chronic problem. The current episode started more than 1 year ago. The problem has been waxing and waning since onset. The problem is resistant. Associated symptoms include anxiety, headaches and malaise/fatigue. Pertinent negatives include no chest pain, palpitations or shortness of breath.  ?Depression ?       This is a recurrent problem.  The current episode started 1 to 4 weeks ago.   The onset quality is sudden.   The problem has been rapidly worsening since onset.  Associated symptoms include decreased concentration, fatigue, insomnia, irritable, restlessness, appetite change and headaches.( Migraines)     The symptoms are aggravated by family issues (Loss of her Father on April 19th, Mother in [place] had a stroke 1 day before this happened, Sister is now homeless).  Risk factors include family history of mental illness, major life event and stress.   Past medical history includes chronic fatigue syndrome and anxiety.   ?Anxiety ?Presents for initial visit. Onset was 1 to 4 weeks ago. The problem has been rapidly worsening. Symptoms include decreased concentration, depressed mood, excessive worry, insomnia, irritability, malaise, nervous/anxious behavior and restlessness. Patient reports no chest pain, palpitations or shortness of breath. Symptoms occur constantly.  ? ?Risk factors include a major life event.  ? ?Lab Results  ?Component Value Date  ? NA 136 10/08/2021  ? K 3.8 10/08/2021  ? CO2 24 10/08/2021  ? GLUCOSE 93 10/08/2021  ? BUN 10 10/08/2021  ? CREATININE 0.61 10/08/2021  ? CALCIUM 9.5 10/08/2021  ? GFRNONAA >60 10/08/2021  ? ?No results found for: CHOL, HDL, LDLCALC, LDLDIRECT, TRIG, CHOLHDL ?Lab Results  ?Component Value Date  ? TSH 0.954 04/24/2021  ? ?Lab Results  ?Component Value Date  ? HGBA1C 5.9 (H) 10/08/2021  ? ?Lab  Results  ?Component Value Date  ? WBC 13.2 (H) 12/17/2020  ? HGB 13.5 12/17/2020  ? HCT 40.1 12/17/2020  ? MCV 83.9 12/17/2020  ? PLT 277 12/17/2020  ? ?Lab Results  ?Component Value Date  ? ALT 17 12/17/2020  ? AST 17 12/17/2020  ? ALKPHOS 69 12/17/2020  ? BILITOT 0.6 12/17/2020  ? ?No results found for: 25OHVITD2, 25OHVITD3, VD25OH  ? ?Review of Systems  ?Constitutional:  Positive for appetite change, fatigue, irritability and malaise/fatigue.  ?Respiratory:  Negative for chest tightness and shortness of breath.   ?Cardiovascular:  Negative for chest pain and palpitations.  ?Neurological:  Positive for headaches.  ?Psychiatric/Behavioral:  Positive for decreased concentration and depression. The patient is nervous/anxious and has insomnia.   ? ?Patient Active Problem List  ? Diagnosis Date Noted  ? Recurrent periodic urticaria 12/09/2021  ? Left cervical radiculopathy 02/04/2021  ? Supraspinatus tendinitis, left 02/04/2021  ? Cervical paraspinal muscle spasm 02/04/2021  ? Rosacea 01/09/2020  ? Panic disorder with agoraphobia 07/11/2019  ? Pre-diabetes 01/26/2019  ? Liver mass 01/25/2019  ? Essential hypertension 07/15/2017  ? Gastroesophageal reflux disease without esophagitis 12/18/2016  ? History of kidney stones 06/04/2015  ? Dysmenorrhea 06/04/2015  ? Migraine without aura and without status migrainosus, not intractable 06/04/2015  ? ? ?Allergies  ?Allergen Reactions  ? Sulfa Antibiotics Anaphylaxis, Swelling, Rash and Other (See Comments)  ? Azithromycin Rash  ?  Rash as a teenager, "Z Pack"  ? ? ?Past Surgical History:  ?Procedure Laterality Date  ?  DILATION AND CURETTAGE OF UTERUS    ? TUBAL LIGATION Bilateral 2007  ? ? ?Social History  ? ?Tobacco Use  ? Smoking status: Every Day  ?  Packs/day: 0.50  ?  Years: 24.00  ?  Pack years: 12.00  ?  Types: Cigarettes  ?  Start date: 881996  ? Smokeless tobacco: Never  ?Vaping Use  ? Vaping Use: Never used  ?Substance Use Topics  ? Alcohol use: No  ?  Alcohol/week:  0.0 standard drinks  ? Drug use: No  ? ? ? ?Medication list has been reviewed and updated. ? ?Current Meds  ?Medication Sig  ? ibuprofen (ADVIL) 600 MG tablet Take 1 tablet (600 mg total) by mouth every 6 (six) hours as needed.  ? lisinopril (ZESTRIL) 30 MG tablet Take 1 tablet (30 mg total) by mouth daily.  ? propranolol (INDERAL) 20 MG tablet Take 20 mg by mouth daily.  ? traMADol (ULTRAM) 50 MG tablet Take 1 tablet (50 mg total) by mouth daily as needed (migraine HA).  ? ? ? ?  03/12/2022  ? 10:58 AM 12/09/2021  ? 10:20 AM 10/08/2021  ?  1:27 PM 07/08/2021  ?  3:56 PM  ?GAD 7 : Generalized Anxiety Score  ?Nervous, Anxious, on Edge 3 1 2 1   ?Control/stop worrying 3 1 1 1   ?Worry too much - different things 3 1 1 1   ?Trouble relaxing 3 1 1 1   ?Restless 2 1 2  0  ?Easily annoyed or irritable 3 1 0 1  ?Afraid - awful might happen 3 1 1 1   ?Total GAD 7 Score 20 7 8 6   ?Anxiety Difficulty Extremely difficult  Somewhat difficult Not difficult at all  ? ? ? ?  03/12/2022  ? 10:58 AM  ?Depression screen PHQ 2/9  ?Decreased Interest 1  ?Down, Depressed, Hopeless 1  ?PHQ - 2 Score 2  ?Altered sleeping 3  ?Tired, decreased energy 2  ?Change in appetite 2  ?Feeling bad or failure about yourself  1  ?Trouble concentrating 3  ?Moving slowly or fidgety/restless 0  ?Suicidal thoughts 2  ?PHQ-9 Score 15  ?Difficult doing work/chores Extremely dIfficult  ? ? ?BP Readings from Last 3 Encounters:  ?03/12/22 118/70  ?02/19/22 121/80  ?12/09/21 (!) 110/58  ? ? ?Physical Exam ?Vitals and nursing note reviewed.  ?Constitutional:   ?   General: She is irritable. She is not in acute distress. ?   Appearance: Normal appearance. She is well-developed.  ?HENT:  ?   Head: Normocephalic and atraumatic.  ?Cardiovascular:  ?   Rate and Rhythm: Normal rate and regular rhythm.  ?Pulmonary:  ?   Effort: Pulmonary effort is normal. No respiratory distress.  ?   Breath sounds: No wheezing or rhonchi.  ?Musculoskeletal:  ?   Cervical back: Normal range of  motion.  ?   Right lower leg: No edema.  ?   Left lower leg: No edema.  ?Skin: ?   General: Skin is warm and dry.  ?   Findings: No rash.  ?Neurological:  ?   Mental Status: She is alert and oriented to person, place, and time.  ?Psychiatric:     ?   Attention and Perception: Attention normal.     ?   Mood and Affect: Mood and affect normal.     ?   Speech: Speech normal.     ?   Behavior: Behavior normal.  ? ? ?Wt Readings from Last 3 Encounters:  ?03/12/22  189 lb (85.7 kg)  ?12/09/21 188 lb (85.3 kg)  ?10/08/21 186 lb (84.4 kg)  ? ? ?BP 118/70   Pulse 82   Ht 5' (1.524 m)   Wt 189 lb (85.7 kg)   LMP 02/12/2022   BMI 36.91 kg/m?  ? ?Assessment and Plan: ?1. Essential hypertension ?Clinically stable exam with well controlled BP. ?Tolerating medications without side effects at this time. ?Pt to continue current regimen and low sodium diet; benefits of regular exercise as able discussed.  She needs to cut back on energy drinks and nicotine vaping. ?- lisinopril (ZESTRIL) 30 MG tablet; Take 1 tablet (30 mg total) by mouth daily.  Dispense: 90 tablet; Refill: 1 ? ?2. Severe episode of recurrent major depressive disorder, without psychotic features (HCC) ?Recent loss of her father, mother-in-law in hospice, sister homeless with 2 small children living with her. ?Symptoms are severe but without SI/HI. ?Auvelity samples given - follow up in 1 month ? ?3. Migraine without aura and without status migrainosus, not intractable ?Continue beta blocker and Excedrin Migraine ?Working with Neurology to find a medication that is approved. ?Currently using Tramadol with a limit of 15/mo. ?- propranolol (INDERAL) 20 MG tablet; Take 1 tablet (20 mg total) by mouth daily.  Dispense: 90 tablet; Refill: 1 ? ? ?Partially dictated using Animal nutritionist. Any errors are unintentional. ? ?Bari Edward, MD ?[facility] ?[facility] ? ?03/12/2022 ? ? ? ? ? ?

## 2022-03-12 NOTE — Patient Instructions (Signed)
Auvelity - take one a day in the AM for three days then increase to 1 tab twice a day. ? ?Call for a prescription if helpful and be sure to use the coupon code on your phone. ?

## 2022-03-13 ENCOUNTER — Encounter: Payer: Medicaid Other | Admitting: Obstetrics and Gynecology

## 2022-03-13 DIAGNOSIS — Z30430 Encounter for insertion of intrauterine contraceptive device: Secondary | ICD-10-CM

## 2022-03-21 ENCOUNTER — Other Ambulatory Visit: Payer: Self-pay | Admitting: Internal Medicine

## 2022-03-21 ENCOUNTER — Encounter: Payer: Self-pay | Admitting: Internal Medicine

## 2022-03-21 DIAGNOSIS — L0291 Cutaneous abscess, unspecified: Secondary | ICD-10-CM

## 2022-03-21 MED ORDER — DOXYCYCLINE HYCLATE 100 MG PO CAPS: 100.0000 mg | ORAL_CAPSULE | Freq: Two times a day (BID) | ORAL | 0 refills | Status: AC

## 2022-03-27 ENCOUNTER — Other Ambulatory Visit: Payer: Self-pay | Admitting: Internal Medicine

## 2022-03-27 DIAGNOSIS — G43009 Migraine without aura, not intractable, without status migrainosus: Secondary | ICD-10-CM

## 2022-03-27 NOTE — Telephone Encounter (Signed)
Please review. Last office visit 03/12/22.  KP

## 2022-03-28 MED ORDER — TRAMADOL HCL 50 MG PO TABS: 50.0000 mg | ORAL_TABLET | Freq: Every day | ORAL | 0 refills | Status: DC | PRN

## 2022-04-01 ENCOUNTER — Encounter: Payer: Self-pay | Admitting: Internal Medicine

## 2022-04-02 ENCOUNTER — Ambulatory Visit (INDEPENDENT_AMBULATORY_CARE_PROVIDER_SITE_OTHER): Payer: Medicaid Other | Admitting: Internal Medicine

## 2022-04-02 ENCOUNTER — Encounter: Payer: Self-pay | Admitting: Internal Medicine

## 2022-04-02 VITALS — BP 122/78 | HR 84 | Ht 60.0 in | Wt 190.0 lb

## 2022-04-02 DIAGNOSIS — R21 Rash and other nonspecific skin eruption: Secondary | ICD-10-CM

## 2022-04-02 MED ORDER — BENZOYL PEROXIDE 5 % EX CREA: TOPICAL_CREAM | Freq: Every day | CUTANEOUS | 0 refills | Status: DC

## 2022-04-02 NOTE — Progress Notes (Signed)
Date:  04/02/2022   Name:  Amy Gomez   DOB:  Dec 07, 1980   MRN:  [id number]   Chief Complaint: Breast Mass (Left breast more has popped up, new cyst are now getting smaller, painful )  Carbuncle:  Patient presents today with several cyst/ boils on skin of Left Breast. This has been going on since patients last visit 3 weeks ago. Patient said the carbuncles have been draining "dark pus", and they are very painful. Some of the new spots have decreased in size.  Lab Results  Component Value Date   NA 136 10/08/2021   K 3.8 10/08/2021   CO2 24 10/08/2021   GLUCOSE 93 10/08/2021   BUN 10 10/08/2021   CREATININE 0.61 10/08/2021   CALCIUM 9.5 10/08/2021   GFRNONAA >60 10/08/2021   No results found for: CHOL, HDL, LDLCALC, LDLDIRECT, TRIG, CHOLHDL Lab Results  Component Value Date   TSH 0.954 04/24/2021   Lab Results  Component Value Date   HGBA1C 5.9 (H) 10/08/2021   Lab Results  Component Value Date   WBC 13.2 (H) 12/17/2020   HGB 13.5 12/17/2020   HCT 40.1 12/17/2020   MCV 83.9 12/17/2020   PLT 277 12/17/2020   Lab Results  Component Value Date   ALT 17 12/17/2020   AST 17 12/17/2020   ALKPHOS 69 12/17/2020   BILITOT 0.6 12/17/2020   No results found for: 25OHVITD2, 25OHVITD3, VD25OH   Review of Systems  Constitutional: Negative.  Negative for fatigue and fever.  HENT: Negative.    Eyes: Negative.   Respiratory: Negative.  Negative for chest tightness and shortness of breath.   Cardiovascular: Negative.  Negative for chest pain and palpitations.  Gastrointestinal: Negative.   Endocrine: Negative.   Genitourinary: Negative.   Musculoskeletal: Negative.   Skin:  Positive for rash (Carbuncles on the Left Breast).  Neurological:  Positive for headaches. Negative for dizziness.   Patient Active Problem List   Diagnosis Date Noted   Recurrent periodic urticaria 12/09/2021   Left cervical radiculopathy 02/04/2021   Supraspinatus tendinitis, left  02/04/2021   Cervical paraspinal muscle spasm 02/04/2021   Rosacea 01/09/2020   Panic disorder with agoraphobia 07/11/2019   Pre-diabetes 01/26/2019   Liver mass 01/25/2019   Essential hypertension 07/15/2017   Gastroesophageal reflux disease without esophagitis 12/18/2016   History of kidney stones 06/04/2015   Dysmenorrhea 06/04/2015   Migraine without aura and without status migrainosus, not intractable 06/04/2015    Allergies  Allergen Reactions   Sulfa Antibiotics Anaphylaxis, Swelling, Rash and Other (See Comments)   Azithromycin Rash    Rash as a teenager, "Z Pack"    Past Surgical History:  Procedure Laterality Date   DILATION AND CURETTAGE OF UTERUS     TUBAL LIGATION Bilateral 2007    Social History   Tobacco Use   Smoking status: Every Day    Packs/day: 0.50    Years: 24.00    Pack years: 12.00    Types: Cigarettes    Start date: 1996   Smokeless tobacco: Never  Vaping Use   Vaping Use: Never used  Substance Use Topics   Alcohol use: No    Alcohol/week: 0.0 standard drinks   Drug use: No     Medication list has been reviewed and updated.  Current Meds  Medication Sig   benzoyl peroxide 5 % cream Apply topically at bedtime. To skin under both breasts   ibuprofen (ADVIL) 600 MG tablet Take 1 tablet (600 mg  total) by mouth every 6 (six) hours as needed.   lisinopril (ZESTRIL) 30 MG tablet Take 1 tablet (30 mg total) by mouth daily.   propranolol (INDERAL) 20 MG tablet Take 1 tablet (20 mg total) by mouth daily.   traMADol (ULTRAM) 50 MG tablet Take 1 tablet (50 mg total) by mouth daily as needed (migraine HA).       04/02/2022    1:28 PM 03/12/2022   10:58 AM 12/09/2021   10:20 AM 10/08/2021    1:27 PM  GAD 7 : Generalized Anxiety Score  Nervous, Anxious, on Edge 1 3 1 2   Control/stop worrying 3 3 1 1   Worry too much - different things 3 3 1 1   Trouble relaxing 3 3 1 1   Restless 2 2 1 2   Easily annoyed or irritable 2 3 1  0  Afraid - awful might  happen 2 3 1 1   Total GAD 7 Score 16 20 7 8   Anxiety Difficulty Somewhat difficult Extremely difficult  Somewhat difficult       04/02/2022    1:28 PM  Depression screen PHQ 2/9  Decreased Interest 1  Down, Depressed, Hopeless 2  PHQ - 2 Score 3  Altered sleeping 0  Tired, decreased energy 0  Change in appetite 0  Feeling bad or failure about yourself  0  Trouble concentrating 1  Moving slowly or fidgety/restless 0  Suicidal thoughts 0  PHQ-9 Score 4  Difficult doing work/chores Not difficult at all    BP Readings from Last 3 Encounters:  04/02/22 122/78  03/12/22 118/70  02/19/22 121/80    Physical Exam Vitals and nursing note reviewed.  Constitutional:      General: She is not in acute distress.    Appearance: Normal appearance. She is well-developed.  HENT:     Head: Normocephalic and atraumatic.  Cardiovascular:     Rate and Rhythm: Normal rate and regular rhythm.  Pulmonary:     Effort: Pulmonary effort is normal. No respiratory distress.     Breath sounds: No wheezing or rhonchi.  Chest:     Comments: Several healing small lesions under both breasts c/w acne.  No active infection. Skin:    General: Skin is warm and dry.     Findings: Abscess present. No rash.       Neurological:     Mental Status: She is alert and oriented to person, place, and time.  Psychiatric:        Mood and Affect: Mood normal.        Behavior: Behavior normal.    Wt Readings from Last 3 Encounters:  04/02/22 190 lb (86.2 kg)  03/12/22 189 lb (85.7 kg)  12/09/21 188 lb (85.3 kg)    BP 122/78   Pulse 84   Ht 5' (1.524 m)   Wt 190 lb (86.2 kg)   SpO2 98%   BMI 37.11 kg/m   Assessment and Plan: 1. Rash and nonspecific skin eruption Continue antibacterial cleansers. Apply BP cream every other night sparingly to control outbreaks - benzoyl peroxide 5 % cream; Apply topically at bedtime. To skin under both breasts  Dispense: 113.4 g; Refill: 0   Partially dictated using  04/04/2022. Any errors are unintentional.  4/9, MD [facility]  04/02/2022

## 2022-04-11 ENCOUNTER — Encounter: Payer: Self-pay | Admitting: Internal Medicine

## 2022-04-11 ENCOUNTER — Ambulatory Visit (INDEPENDENT_AMBULATORY_CARE_PROVIDER_SITE_OTHER): Payer: Medicaid Other | Admitting: Internal Medicine

## 2022-04-11 VITALS — BP 108/82 | HR 96 | Ht 60.0 in | Wt 187.0 lb

## 2022-04-11 DIAGNOSIS — F332 Major depressive disorder, recurrent severe without psychotic features: Secondary | ICD-10-CM | POA: Insufficient documentation

## 2022-04-11 MED ORDER — AUVELITY 45-105 MG PO TBCR: 1.0000 | EXTENDED_RELEASE_TABLET | Freq: Two times a day (BID) | ORAL | 0 refills | Status: DC

## 2022-04-11 NOTE — Progress Notes (Signed)
Date:  04/11/2022   Name:  Amy Gomez   DOB:  Dec 02, 1980   MRN:  [id number]   Chief Complaint: Depression  Depression        This is a chronic problem.  The problem has been gradually improving since onset.  Associated symptoms include appetite change (decreased some) and headaches.  Associated symptoms include no fatigue.  Treatments tried: currently on Avelity.  Compliance with treatment is good.  Previous treatment provided moderate relief. She has tried multiple agents over the years without much benefit and with side effects.  She is certain that she has tried and failed Lexapro, Prozac, Zoloft, Seroquel, Buspar, Trazodone and possibly Paxil.  Lab Results  Component Value Date   NA 136 10/08/2021   K 3.8 10/08/2021   CO2 24 10/08/2021   GLUCOSE 93 10/08/2021   BUN 10 10/08/2021   CREATININE 0.61 10/08/2021   CALCIUM 9.5 10/08/2021   GFRNONAA >60 10/08/2021   No results found for: CHOL, HDL, LDLCALC, LDLDIRECT, TRIG, CHOLHDL Lab Results  Component Value Date   TSH 0.954 04/24/2021   Lab Results  Component Value Date   HGBA1C 5.9 (H) 10/08/2021   Lab Results  Component Value Date   WBC 13.2 (H) 12/17/2020   HGB 13.5 12/17/2020   HCT 40.1 12/17/2020   MCV 83.9 12/17/2020   PLT 277 12/17/2020   Lab Results  Component Value Date   ALT 17 12/17/2020   AST 17 12/17/2020   ALKPHOS 69 12/17/2020   BILITOT 0.6 12/17/2020   No results found for: 25OHVITD2, 25OHVITD3, VD25OH   Review of Systems  Constitutional:  Positive for appetite change (decreased some). Negative for chills, fatigue and fever.  Respiratory:  Positive for chest tightness. Negative for cough, shortness of breath and wheezing.   Cardiovascular:  Negative for chest pain, palpitations and leg swelling.  Musculoskeletal:  Negative for arthralgias and gait problem.  Neurological:  Positive for headaches. Negative for dizziness, light-headedness and numbness.  Psychiatric/Behavioral:  Positive for  depression and dysphoric mood. The patient is nervous/anxious (ocassional panic attacks with chest pressure).    Patient Active Problem List   Diagnosis Date Noted   Recurrent periodic urticaria 12/09/2021   Left cervical radiculopathy 02/04/2021   Supraspinatus tendinitis, left 02/04/2021   Cervical paraspinal muscle spasm 02/04/2021   Rosacea 01/09/2020   Panic disorder with agoraphobia 07/11/2019   Pre-diabetes 01/26/2019   Liver mass 01/25/2019   Essential hypertension 07/15/2017   Gastroesophageal reflux disease without esophagitis 12/18/2016   History of kidney stones 06/04/2015   Dysmenorrhea 06/04/2015   Migraine without aura and without status migrainosus, not intractable 06/04/2015    Allergies  Allergen Reactions   Sulfa Antibiotics Anaphylaxis, Swelling, Rash and Other (See Comments)   Azithromycin Rash    Rash as a teenager, "Z Pack"    Past Surgical History:  Procedure Laterality Date   DILATION AND CURETTAGE OF UTERUS     TUBAL LIGATION Bilateral 2007    Social History   Tobacco Use   Smoking status: Every Day    Packs/day: 0.50    Years: 24.00    Pack years: 12.00    Types: Cigarettes    Start date: 1996   Smokeless tobacco: Never  Vaping Use   Vaping Use: Never used  Substance Use Topics   Alcohol use: No    Alcohol/week: 0.0 standard drinks   Drug use: No     Medication list has been reviewed and updated.  Current  Meds  Medication Sig   ibuprofen (ADVIL) 600 MG tablet Take 1 tablet (600 mg total) by mouth every 6 (six) hours as needed.   lisinopril (ZESTRIL) 30 MG tablet Take 1 tablet (30 mg total) by mouth daily.   propranolol (INDERAL) 20 MG tablet Take 1 tablet (20 mg total) by mouth daily.   traMADol (ULTRAM) 50 MG tablet Take 1 tablet (50 mg total) by mouth daily as needed (migraine HA).       04/11/2022   10:36 AM 04/02/2022    1:28 PM 03/12/2022   10:58 AM 12/09/2021   10:20 AM  GAD 7 : Generalized Anxiety Score  Nervous, Anxious,  on Edge 1 1 3 1   Control/stop worrying 2 3 3 1   Worry too much - different things 2 3 3 1   Trouble relaxing 2 3 3 1   Restless 1 2 2 1   Easily annoyed or irritable 2 2 3 1   Afraid - awful might happen 1 2 3 1   Total GAD 7 Score 11 16 20 7   Anxiety Difficulty Somewhat difficult Somewhat difficult Extremely difficult        04/11/2022   10:36 AM  Depression screen PHQ 2/9  Decreased Interest 1  Down, Depressed, Hopeless 1  PHQ - 2 Score 2  Altered sleeping 0  Tired, decreased energy 1  Change in appetite 0  Feeling bad or failure about yourself  0  Trouble concentrating 1  Moving slowly or fidgety/restless 1  Suicidal thoughts 0  PHQ-9 Score 5  Difficult doing work/chores Somewhat difficult    BP Readings from Last 3 Encounters:  04/11/22 108/82  04/02/22 122/78  03/12/22 118/70    Physical Exam Vitals and nursing note reviewed.  Constitutional:      General: She is not in acute distress.    Appearance: Normal appearance. She is well-developed.  HENT:     Head: Normocephalic and atraumatic.  Cardiovascular:     Rate and Rhythm: Normal rate and regular rhythm.     Pulses: Normal pulses.     Heart sounds: No murmur heard. Pulmonary:     Effort: Pulmonary effort is normal. No respiratory distress.     Breath sounds: No wheezing or rhonchi.  Musculoskeletal:     Cervical back: Normal range of motion.     Right lower leg: No edema.     Left lower leg: No edema.  Lymphadenopathy:     Cervical: No cervical adenopathy.  Skin:    General: Skin is warm and dry.     Findings: No rash.  Neurological:     Mental Status: She is alert and oriented to person, place, and time.  Psychiatric:        Mood and Affect: Mood normal.        Behavior: Behavior normal.    Wt Readings from Last 3 Encounters:  04/11/22 187 lb (84.8 kg)  04/02/22 190 lb (86.2 kg)  03/12/22 189 lb (85.7 kg)    BP 108/82   Pulse 96   Ht 5' (1.524 m)   Wt 187 lb (84.8 kg)   SpO2 98%   BMI 36.52  kg/m   Assessment and Plan: 1. Severe episode of recurrent major depressive disorder, without psychotic features (HCC) Long standing symptoms that wax and wane. More recently external stressors have contributed.  She has responded favorably to Auvelity samples and should continue therapy. No SI/HI noted.  No adverse side effects and appetite is decreased leading to modest weight loss. -  Dextromethorphan-buPROPion ER (AUVELITY) 45-105 MG TBCR; Take 1 tablet by mouth 2 (two) times daily.  Dispense: 60 tablet; Refill: 0   Partially dictated using Animal nutritionist. Any errors are unintentional.  Bari Edward, MD [facility]  04/11/2022

## 2022-05-09 ENCOUNTER — Other Ambulatory Visit: Payer: Self-pay | Admitting: Internal Medicine

## 2022-05-09 DIAGNOSIS — G43009 Migraine without aura, not intractable, without status migrainosus: Secondary | ICD-10-CM

## 2022-05-09 MED ORDER — TRAMADOL HCL 50 MG PO TABS: 50.0000 mg | ORAL_TABLET | Freq: Every day | ORAL | 0 refills | Status: DC | PRN

## 2022-05-09 NOTE — Telephone Encounter (Signed)
Pt will come in for samples for Auvelity.  KP

## 2022-05-20 ENCOUNTER — Encounter: Payer: Self-pay | Admitting: Internal Medicine

## 2022-05-29 ENCOUNTER — Ambulatory Visit: Payer: Medicaid Other | Admitting: Internal Medicine

## 2022-05-30 ENCOUNTER — Ambulatory Visit: Payer: Medicaid Other | Admitting: Internal Medicine

## 2022-06-16 ENCOUNTER — Other Ambulatory Visit: Payer: Self-pay | Admitting: Internal Medicine

## 2022-06-16 ENCOUNTER — Other Ambulatory Visit: Payer: Self-pay

## 2022-06-16 ENCOUNTER — Encounter: Payer: Self-pay | Admitting: Internal Medicine

## 2022-06-16 DIAGNOSIS — G43009 Migraine without aura, not intractable, without status migrainosus: Secondary | ICD-10-CM

## 2022-06-16 MED ORDER — IBUPROFEN 600 MG PO TABS: 600.0000 mg | ORAL_TABLET | Freq: Four times a day (QID) | ORAL | 0 refills | Status: DC | PRN

## 2022-06-16 MED ORDER — TRAMADOL HCL 50 MG PO TABS: 50.0000 mg | ORAL_TABLET | Freq: Every day | ORAL | 0 refills | Status: DC | PRN

## 2022-06-16 NOTE — Telephone Encounter (Signed)
Please review.  KP

## 2022-06-18 ENCOUNTER — Encounter (INDEPENDENT_AMBULATORY_CARE_PROVIDER_SITE_OTHER): Payer: Self-pay

## 2022-07-01 ENCOUNTER — Encounter: Payer: Self-pay | Admitting: Internal Medicine

## 2022-07-15 ENCOUNTER — Ambulatory Visit: Payer: Medicaid Other | Admitting: Internal Medicine

## 2022-07-16 ENCOUNTER — Ambulatory Visit: Payer: Medicaid Other | Admitting: Internal Medicine

## 2022-07-18 ENCOUNTER — Ambulatory Visit (INDEPENDENT_AMBULATORY_CARE_PROVIDER_SITE_OTHER): Payer: Medicaid Other | Admitting: Internal Medicine

## 2022-07-18 ENCOUNTER — Encounter: Payer: Self-pay | Admitting: Internal Medicine

## 2022-07-18 VITALS — BP 124/88 | HR 95 | Ht 60.0 in | Wt 189.0 lb

## 2022-07-18 DIAGNOSIS — I10 Essential (primary) hypertension: Secondary | ICD-10-CM | POA: Diagnosis not present

## 2022-07-18 DIAGNOSIS — Z23 Encounter for immunization: Secondary | ICD-10-CM | POA: Diagnosis not present

## 2022-07-18 DIAGNOSIS — F5101 Primary insomnia: Secondary | ICD-10-CM

## 2022-07-18 NOTE — Progress Notes (Signed)
Date:  07/18/2022   Name:  Amy Gomez   DOB:  Jul 05, 1981   MRN:  [id number]   Chief Complaint: Depression, Insomnia, and Flu Vaccine  Depression        This is a recurrent problem.  The problem occurs intermittently.  The problem has been waxing and waning since onset.  Associated symptoms include insomnia, headaches and sad.  Associated symptoms include no decreased concentration, no fatigue, not irritable, no restlessness and no suicidal ideas.     The symptoms are aggravated by family issues (several unexpected deaths, loss of job and possibly insurance).  Treatments tried: tried a number of medications over the years. Insomnia Primary symptoms: sleep disturbance, difficulty falling asleep, frequent awakening.   The current episode started more than one month. The onset quality is sudden. The problem occurs nightly. The problem is unchanged. The symptoms are aggravated by family issues. How many beverages per day that contain caffeine: 2-3.  Types of beverages you drink: coffee and soda. PMH includes: depression.     Lab Results  Component Value Date   NA 136 10/08/2021   K 3.8 10/08/2021   CO2 24 10/08/2021   GLUCOSE 93 10/08/2021   BUN 10 10/08/2021   CREATININE 0.61 10/08/2021   CALCIUM 9.5 10/08/2021   GFRNONAA >60 10/08/2021   No results found for: "CHOL", "HDL", "LDLCALC", "LDLDIRECT", "TRIG", "CHOLHDL" Lab Results  Component Value Date   TSH 0.954 04/24/2021   Lab Results  Component Value Date   HGBA1C 5.9 (H) 10/08/2021   Lab Results  Component Value Date   WBC 13.2 (H) 12/17/2020   HGB 13.5 12/17/2020   HCT 40.1 12/17/2020   MCV 83.9 12/17/2020   PLT 277 12/17/2020   Lab Results  Component Value Date   ALT 17 12/17/2020   AST 17 12/17/2020   ALKPHOS 69 12/17/2020   BILITOT 0.6 12/17/2020   No results found for: "25OHVITD2", "25OHVITD3", "VD25OH"   Review of Systems  Constitutional:  Negative for chills, fatigue and unexpected weight change.   Respiratory:  Negative for chest tightness and shortness of breath.   Cardiovascular:  Negative for chest pain.  Skin:  Positive for color change (recurrent boils on thighs and under breasts).  Neurological:  Positive for headaches. Negative for dizziness.  Psychiatric/Behavioral:  Positive for depression, dysphoric mood and sleep disturbance. Negative for decreased concentration and suicidal ideas. The patient is nervous/anxious and has insomnia.     Patient Active Problem List   Diagnosis Date Noted   Severe episode of recurrent major depressive disorder, without psychotic features (HCC) 04/11/2022   Recurrent periodic urticaria 12/09/2021   Left cervical radiculopathy 02/04/2021   Supraspinatus tendinitis, left 02/04/2021   Cervical paraspinal muscle spasm 02/04/2021   Rosacea 01/09/2020   Panic disorder with agoraphobia 07/11/2019   Pre-diabetes 01/26/2019   Liver mass 01/25/2019   Essential hypertension 07/15/2017   Gastroesophageal reflux disease without esophagitis 12/18/2016   History of kidney stones 06/04/2015   Dysmenorrhea 06/04/2015   Migraine without aura and without status migrainosus, not intractable 06/04/2015    Allergies  Allergen Reactions   Sulfa Antibiotics Anaphylaxis, Swelling, Rash and Other (See Comments)   Azithromycin Rash    Rash as a teenager, "Z Pack"    Past Surgical History:  Procedure Laterality Date   DILATION AND CURETTAGE OF UTERUS     TUBAL LIGATION Bilateral 2007    Social History   Tobacco Use   Smoking status: Every Day  Packs/day: 0.50    Years: 24.00    Total pack years: 12.00    Types: Cigarettes    Start date: 1996   Smokeless tobacco: Never  Vaping Use   Vaping Use: Never used  Substance Use Topics   Alcohol use: No    Alcohol/week: 0.0 standard drinks of alcohol   Drug use: No     Medication list has been reviewed and updated.  Current Meds  Medication Sig   ibuprofen (ADVIL) 600 MG tablet Take 1 tablet  (600 mg total) by mouth every 6 (six) hours as needed.   lisinopril (ZESTRIL) 30 MG tablet Take 1 tablet (30 mg total) by mouth daily.   propranolol (INDERAL) 20 MG tablet Take 1 tablet (20 mg total) by mouth daily.   traMADol (ULTRAM) 50 MG tablet Take 1 tablet (50 mg total) by mouth daily as needed (migraine HA).       07/18/2022    9:38 AM 04/11/2022   10:36 AM 04/02/2022    1:28 PM 03/12/2022   10:58 AM  GAD 7 : Generalized Anxiety Score  Nervous, Anxious, on Edge 3 1 1 3   Control/stop worrying 3 2 3 3   Worry too much - different things 3 2 3 3   Trouble relaxing 3 2 3 3   Restless 3 1 2 2   Easily annoyed or irritable 3 2 2 3   Afraid - awful might happen 3 1 2 3   Total GAD 7 Score 21 11 16 20   Anxiety Difficulty Extremely difficult Somewhat difficult Somewhat difficult Extremely difficult       07/18/2022    9:37 AM 04/11/2022   10:36 AM 04/02/2022    1:28 PM  Depression screen PHQ 2/9  Decreased Interest 2 1 1   Down, Depressed, Hopeless 1 1 2   PHQ - 2 Score 3 2 3   Altered sleeping 3 0 0  Tired, decreased energy 3 1 0  Change in appetite 0 0 0  Feeling bad or failure about yourself  2 0 0  Trouble concentrating 2 1 1   Moving slowly or fidgety/restless 2 1 0  Suicidal thoughts 0 0 0  PHQ-9 Score 15 5 4   Difficult doing work/chores Extremely dIfficult Somewhat difficult Not difficult at all    BP Readings from Last 3 Encounters:  07/18/22 124/88  04/11/22 108/82  04/02/22 122/78    Physical Exam Vitals and nursing note reviewed.  Constitutional:      General: She is not irritable.She is not in acute distress.    Appearance: She is well-developed.  HENT:     Head: Normocephalic and atraumatic.  Cardiovascular:     Rate and Rhythm: Normal rate and regular rhythm.  Pulmonary:     Effort: Pulmonary effort is normal. No respiratory distress.  Musculoskeletal:     Cervical back: Normal range of motion.     Right lower leg: No edema.     Left lower leg: No edema.   Lymphadenopathy:     Cervical: No cervical adenopathy.  Skin:    General: Skin is warm and dry.     Findings: No rash.     Comments: 0.5 cm raised red lesion inner right thigh - no bleeding or drainage  Neurological:     Mental Status: She is alert and oriented to person, place, and time.  Psychiatric:        Mood and Affect: Mood normal.        Behavior: Behavior normal.     Wt Readings  from Last 3 Encounters:  07/18/22 189 lb (85.7 kg)  04/11/22 187 lb (84.8 kg)  04/02/22 190 lb (86.2 kg)    BP 124/88   Pulse 95   Ht 5' (1.524 m)   Wt 189 lb (85.7 kg)   SpO2 98%   BMI 36.91 kg/m   Assessment and Plan: 1. Primary insomnia Samples of Belsomra 15 mg given for 12 days  Take nightly  2. Need for immunization against influenza - Flu Vaccine QUAD 54mo+IM (Fluarix, Fluzone & Alfiuria Quad PF)  3. Essential hypertension Clinically stable exam with well controlled BP. Tolerating medications without side effects at this time. Pt to continue current regimen and low sodium diet; benefits of regular exercise as able discussed.   Partially dictated using Animal nutritionist. Any errors are unintentional.  Bari Edward, MD [facility]  07/18/2022

## 2022-07-21 ENCOUNTER — Ambulatory Visit
Admission: EM | Admit: 2022-07-21 | Discharge: 2022-07-21 | Disposition: A | Discharge status: Home / Self Care | Payer: Medicaid Other | Attending: Internal Medicine | Admitting: Internal Medicine

## 2022-07-21 ENCOUNTER — Encounter: Payer: Self-pay | Admitting: Emergency Medicine

## 2022-07-21 DIAGNOSIS — L03119 Cellulitis of unspecified part of limb: Secondary | ICD-10-CM | POA: Insufficient documentation

## 2022-07-21 DIAGNOSIS — L02419 Cutaneous abscess of limb, unspecified: Secondary | ICD-10-CM | POA: Insufficient documentation

## 2022-07-21 MED ORDER — TRAMADOL HCL 100 MG PO TABS: ORAL_TABLET | ORAL | 0 refills | Status: DC

## 2022-07-21 MED ORDER — CEFTRIAXONE SODIUM 1 G IJ SOLR
1.0000 g | Freq: Once | INTRAMUSCULAR | Status: AC
  Administered 2022-07-21: 1 g via INTRAMUSCULAR

## 2022-07-21 MED ORDER — DOXYCYCLINE HYCLATE 100 MG PO CAPS: 100.0000 mg | ORAL_CAPSULE | Freq: Two times a day (BID) | ORAL | 0 refills | Status: DC

## 2022-07-21 NOTE — ED Provider Notes (Signed)
[facility]    CSN: [id number] Arrival date & time: 07/21/22  1558      History   Chief Complaint Chief Complaint  Patient presents with   Bump     HPI Amy Gomez is a 41 y.o. female who presents with painful rash on her R inner thigh x 4 days. Has hx of frequent abscesses, but has never had them cultured. Has been using warm compresses and today it started draining.  OTC meds are not helping pain and she has to watch her sister's kids tomorrow. Would like something for pain.    Past Medical History:  Diagnosis Date   Hypertension    Kidney stones    Leukocytosis 08/16/2018   Migraine     Patient Active Problem List   Diagnosis Date Noted   Severe episode of recurrent major depressive disorder, without psychotic features (HCC) 04/11/2022   Recurrent periodic urticaria 12/09/2021   Left cervical radiculopathy 02/04/2021   Supraspinatus tendinitis, left 02/04/2021   Cervical paraspinal muscle spasm 02/04/2021   Rosacea 01/09/2020   Panic disorder with agoraphobia 07/11/2019   Pre-diabetes 01/26/2019   Liver mass 01/25/2019   Essential hypertension 07/15/2017   Gastroesophageal reflux disease without esophagitis 12/18/2016   History of kidney stones 06/04/2015   Dysmenorrhea 06/04/2015   Migraine without aura and without status migrainosus, not intractable 06/04/2015    Past Surgical History:  Procedure Laterality Date   DILATION AND CURETTAGE OF UTERUS     TUBAL LIGATION Bilateral 2007    OB History     Gravida  4   Para  3   Term  2   Preterm  1   AB  1   Living  3      SAB  1   IAB      Ectopic      Multiple      Live Births  3            Home Medications    Prior to Admission medications   Medication Sig Start Date End Date Taking? Authorizing Provider  doxycycline (VIBRAMYCIN) 100 MG capsule Take 1 capsule (100 mg total) by mouth 2 (two) times daily. 07/21/22  Yes Rodriguez-Southworth, Nettie Elm, PA-C   lisinopril (ZESTRIL) 30 MG tablet Take 1 tablet (30 mg total) by mouth daily. 03/12/22  Yes Reubin Milan, MD  propranolol (INDERAL) 20 MG tablet Take 1 tablet (20 mg total) by mouth daily. 03/12/22  Yes Reubin Milan, MD  traMADol HCl 100 MG TABS 1 q 6h prn abscess  pain 07/21/22  Yes Rodriguez-Southworth, Nettie Elm, PA-C  ibuprofen (ADVIL) 600 MG tablet Take 1 tablet (600 mg total) by mouth every 6 (six) hours as needed. 06/16/22   Reubin Milan, MD  diphenhydrAMINE HCl (BENADRYL PO) Take by mouth as needed.  09/03/20  [provider]  omeprazole (PRILOSEC) 10 MG capsule Take 1 capsule (10 mg total) by mouth daily. Patient not taking: Reported on 09/26/2019 09/24/19 12/06/19  Cuthriell, Delorise Royals, PA-C    Family History Family History  Problem Relation Age of Onset   Other Mother        unknown medical history   Other Father        unknown medical hisotry   Endometriosis Maternal Grandmother     Social History Social History   Tobacco Use   Smoking status: Every Day    Packs/day: 0.50    Years: 24.00    Total pack years: 12.00  Types: Cigarettes    Start date: 1996   Smokeless tobacco: Never  Vaping Use   Vaping Use: Former  Substance Use Topics   Alcohol use: No    Alcohol/week: 0.0 standard drinks of alcohol   Drug use: No     Allergies   Sulfa antibiotics and Azithromycin   Review of Systems Review of Systems  Constitutional:  Positive for chills. Negative for fever.  Skin:  Positive for color change and wound.     Physical Exam Triage Vital Signs ED Triage Vitals  Enc Vitals Group     BP 07/21/22 1651 135/88     Pulse Rate 07/21/22 1651 89     Resp 07/21/22 1651 16     Temp 07/21/22 1651 98.6 F (37 C)     Temp Source 07/21/22 1651 Oral     SpO2 07/21/22 1651 100 %     Weight --      Height --      Head Circumference --      Peak Flow --      Pain Score 07/21/22 1649 8     Pain Loc --      Pain Edu? --      Excl. in GC? --     No data found.  Updated Vital Signs BP 135/88 (BP Location: Left Wrist)   Pulse 89   Temp 98.6 F (37 C) (Oral)   Resp 16   LMP 07/16/2022 (Approximate)   SpO2 100%   Visual Acuity Right Eye Distance:   Left Eye Distance:   Bilateral Distance:    Right Eye Near:   Left Eye Near:    Bilateral Near:     Physical Exam Vitals and nursing note reviewed.  Constitutional:      General: She is not in acute distress.    Appearance: She is obese. She is not toxic-appearing.  HENT:     Right Ear: External ear normal.     Left Ear: External ear normal.  Eyes:     General: No scleral icterus.    Conjunctiva/sclera: Conjunctivae normal.  Pulmonary:     Effort: Pulmonary effort is normal.  Musculoskeletal:        General: Normal range of motion.     Cervical back: Neck supple.  Lymphadenopathy:     Lower Body: No right inguinal adenopathy.  Skin:    Comments: R UPPER LEG- with 3 cm x 4 cm fluctuant mass which is draining puss, and has erythema and warmth around it, but no streaking.   Neurological:     Mental Status: She is alert and oriented to person, place, and time.     Gait: Gait normal.  Psychiatric:        Mood and Affect: Mood normal.        Behavior: Behavior normal.        Thought Content: Thought content normal.        Judgment: Judgment normal.      UC Treatments / Results  Labs (all labs ordered are listed, but only abnormal results are displayed) Labs Reviewed - No data to display  EKG   Radiology No results found.  Procedures Procedures (including critical care time)  Medications Ordered in UC Medications  cefTRIAXone (ROCEPHIN) injection 1 g (has no administration in time range)    Initial Impression / Assessment and Plan / UC Course  I have reviewed the triage vital signs and the nursing notes.  Abscess R inner thigh  Wound culture ordered She was given Rocephin 1G IM I also placed her on Doxy as noted.  She was given a few  Tramadols for pain as noted. This does not cause sedation for her.   Final Clinical Impressions(s) / UC Diagnoses   Final diagnoses:  Cellulitis and abscess of leg     Discharge Instructions      We will call you when the wound culture is back Continue using the heat compresses     ED Prescriptions     Medication Sig Dispense Auth. Provider   doxycycline (VIBRAMYCIN) 100 MG capsule Take 1 capsule (100 mg total) by mouth 2 (two) times daily. 20 capsule Rodriguez-Southworth, Jaydence Arnesen, PA-C   traMADol HCl 100 MG TABS 1 q 6h prn abscess  pain 10 tablet Rodriguez-Southworth, Nettie Elm, PA-C      I have reviewed the PDMP during this encounter.   Garey Ham, PA-C 07/21/22 1725

## 2022-07-21 NOTE — Discharge Instructions (Addendum)
We will call you when the wound culture is back Continue using the heat compresses

## 2022-07-21 NOTE — ED Triage Notes (Signed)
Pt presents with a bump on her right inner thigh red and painful x 4 days.

## 2022-07-26 LAB — AEROBIC/ANAEROBIC CULTURE W GRAM STAIN (SURGICAL/DEEP WOUND): Gram Stain: NONE SEEN

## 2022-08-05 ENCOUNTER — Encounter: Payer: Self-pay | Admitting: Internal Medicine

## 2022-08-12 ENCOUNTER — Other Ambulatory Visit: Payer: Self-pay | Admitting: Internal Medicine

## 2022-08-12 DIAGNOSIS — G43009 Migraine without aura, not intractable, without status migrainosus: Secondary | ICD-10-CM

## 2022-08-12 NOTE — Telephone Encounter (Signed)
Requested medication (s) are due for refill today - no  Requested medication (s) are on the active medication list -no  Future visit scheduled -no  Last refill: 07/21/22 #10- but not at this dose- this dose is not on current medication list  Notes to clinic: non delegated Rx  Requested Prescriptions  Pending Prescriptions Disp Refills   traMADol (ULTRAM) 50 MG tablet [Pharmacy Med Name: traMADol HCl 50 MG Oral Tablet] 15 tablet 0    Sig: TAKE 1 TABLET BY MOUTH ONCE DAILY AS NEEDED (MIGRAINE HEADACHE).     Not Delegated - Analgesics:  Opioid Agonists Failed - 08/12/2022  8:22 AM      Failed - This refill cannot be delegated      Failed - Urine Drug Screen completed in last 360 days      Passed - Valid encounter within last 3 months    Recent Outpatient Visits           3 weeks ago Primary insomnia   [place] [facility] at [facility], Jesse Sans, MD   4 months ago Severe episode of recurrent major depressive disorder, without psychotic features [facility])   [place] [facility] at [facility], Jesse Sans, MD   4 months ago Rash and nonspecific skin eruption   [place] [facility] at [facility], Jesse Sans, MD   5 months ago Essential hypertension   [place] [facility] at [facility], Jesse Sans, MD   8 months ago Essential hypertension   [place] [facility] Medicine at [facility], Jesse Sans, MD                 Requested Prescriptions  Pending Prescriptions Disp Refills   traMADol (ULTRAM) 50 MG tablet [Pharmacy Med Name: traMADol HCl 50 MG Oral Tablet] 15 tablet 0    Sig: TAKE 1 TABLET BY MOUTH ONCE DAILY AS NEEDED (MIGRAINE HEADACHE).     Not Delegated - Analgesics:  Opioid Agonists Failed - 08/12/2022  8:22 AM      Failed - This refill cannot be delegated      Failed - Urine Drug Screen completed in  last 360 days      Passed - Valid encounter within last 3 months    Recent Outpatient Visits           3 weeks ago Primary insomnia   [place] [facility] at [facility], Jesse Sans, MD   4 months ago Severe episode of recurrent major depressive disorder, without psychotic features [facility])   [place] [facility] at [facility], Jesse Sans, MD   4 months ago Rash and nonspecific skin eruption   [place] [facility] at [facility], Jesse Sans, MD   5 months ago Essential hypertension   [place] [facility] at [facility], Jesse Sans, MD   8 months ago Essential hypertension   [place] [facility] at [facility], Jesse Sans, MD

## 2022-09-15 ENCOUNTER — Other Ambulatory Visit: Payer: Self-pay | Admitting: Internal Medicine

## 2022-09-15 DIAGNOSIS — G43009 Migraine without aura, not intractable, without status migrainosus: Secondary | ICD-10-CM

## 2022-09-15 MED ORDER — TRAMADOL HCL 50 MG PO TABS: 50.0000 mg | ORAL_TABLET | Freq: Every day | ORAL | 0 refills | Status: DC | PRN

## 2022-09-15 NOTE — Telephone Encounter (Signed)
Please review.  KP

## 2022-09-15 NOTE — Telephone Encounter (Signed)
Requested medication (s) are due for refill today - yes  Requested medication (s) are on the active medication list -yes  Future visit scheduled -no  Last refill: 08/12/22 #15  Notes to clinic: non delegated Rx  Requested Prescriptions  Pending Prescriptions Disp Refills   traMADol (ULTRAM) 50 MG tablet [Pharmacy Med Name: traMADol HCl 50 MG Oral Tablet] 15 tablet 0    Sig: TAKE 1 TABLET BY MOUTH ONCE DAILY AS NEEDED (MIGRAINE HEADACHE).     Not Delegated - Analgesics:  Opioid Agonists Failed - 09/15/2022  9:57 AM      Failed - This refill cannot be delegated      Failed - Urine Drug Screen completed in last 360 days      Passed - Valid encounter within last 3 months    Recent Outpatient Visits           1 month ago Primary insomnia   [place] [facility] at [facility], Jesse Sans, MD   5 months ago Severe episode of recurrent major depressive disorder, without psychotic features [facility])   [place] [facility] at [facility], Jesse Sans, MD   5 months ago Rash and nonspecific skin eruption   [place] [facility] at [facility], Jesse Sans, MD   6 months ago Essential hypertension   [place] [facility] at [facility], Jesse Sans, MD   9 months ago Essential hypertension   [place] and Sports Medicine at [facility], Jesse Sans, MD                 Requested Prescriptions  Pending Prescriptions Disp Refills   traMADol (ULTRAM) 50 MG tablet [Pharmacy Med Name: traMADol HCl 50 MG Oral Tablet] 15 tablet 0    Sig: TAKE 1 TABLET BY MOUTH ONCE DAILY AS NEEDED (MIGRAINE HEADACHE).     Not Delegated - Analgesics:  Opioid Agonists Failed - 09/15/2022  9:57 AM      Failed - This refill cannot be delegated      Failed - Urine Drug Screen completed in last 360 days      Passed - Valid encounter within last 3 months     Recent Outpatient Visits           1 month ago Primary insomnia   [place] [facility] at [facility], Jesse Sans, MD   5 months ago Severe episode of recurrent major depressive disorder, without psychotic features [facility])   [place] [facility] at [facility], Jesse Sans, MD   5 months ago Rash and nonspecific skin eruption   [place] [facility] at [facility], Jesse Sans, MD   6 months ago Essential hypertension   [place] [facility] at [facility], Jesse Sans, MD   9 months ago Essential hypertension   [place] [facility] at [facility], Jesse Sans, MD

## 2022-09-22 ENCOUNTER — Encounter: Payer: Self-pay | Admitting: Internal Medicine

## 2022-09-22 ENCOUNTER — Ambulatory Visit: Payer: Medicaid Other | Admitting: Family Medicine

## 2022-09-23 ENCOUNTER — Encounter: Payer: Self-pay | Admitting: Internal Medicine

## 2022-09-23 ENCOUNTER — Ambulatory Visit (INDEPENDENT_AMBULATORY_CARE_PROVIDER_SITE_OTHER): Payer: Medicaid Other | Admitting: Family Medicine

## 2022-09-23 ENCOUNTER — Encounter: Payer: Self-pay | Admitting: Family Medicine

## 2022-09-23 VITALS — BP 118/78 | HR 114 | Ht 60.0 in | Wt 187.6 lb

## 2022-09-23 DIAGNOSIS — J019 Acute sinusitis, unspecified: Secondary | ICD-10-CM | POA: Diagnosis not present

## 2022-09-23 DIAGNOSIS — R051 Acute cough: Secondary | ICD-10-CM

## 2022-09-23 LAB — POCT INFLUENZA A/B
Influenza A, POC: NEGATIVE
Influenza B, POC: NEGATIVE

## 2022-09-23 MED ORDER — AMOXICILLIN-POT CLAVULANATE 875-125 MG PO TABS: 1.0000 | ORAL_TABLET | Freq: Two times a day (BID) | ORAL | 0 refills | Status: DC

## 2022-09-23 NOTE — Patient Instructions (Signed)
-   Take Augmentin for full course Take the following while on antibiotics Start Flonase (fluticasone propionate), 2 sprays in each nostril daily Start Mucinex (guaifenesin) twice daily Use vitamin D and vitamin C  - Drink plenty of fluids and get plenty of rest - Contact us for any residual symptoms after the above

## 2022-09-23 NOTE — Progress Notes (Signed)
     Primary Care / Sports Medicine Office Visit  Patient Information:  Patient ID: Amy Gomez, female DOB: [birth year] Age: 41 y.o. MRN: [id number]   Amy Gomez is a pleasant 41 y.o. female presenting with the following:  Chief Complaint  Patient presents with   URI    Cough, runny nose, chills, body aches. Fever at 103, for 3 days.     Vitals:   09/23/22 1534  BP: 118/78  Pulse: (!) 114  SpO2: 98%   Vitals:   09/23/22 1534  Weight: 187 lb 9.6 oz (85.1 kg)  Height: 5' (1.524 m)   Body mass index is 36.64 kg/m.  No results found.   Independent interpretation of notes and tests performed by another provider:   None  Procedures performed:   None  Pertinent History, Exam, Impression, and Recommendations:   Problem List Items Addressed This Visit       Respiratory   Acute rhinosinusitis - Primary    3 day history of fevers, chills, myalgias, headache refractory to her usual treatments. Denies shortness of air, mildly productive cough.  Examination reveals a mildly ill-appearing patient, swollen erythematous turbinates with mild tenderness about the sinuses diffusely, tympanic membranes with fluid posteriorly, no erythema at the membrane or canal, oropharynx with cobblestone pattern, lung fields are clear to auscultation bilaterally without wheezes, rales, rhonchi, O2 sat at baseline.  Her clinical history raises concern for acute rhinosinusitis, etiology can be viral or bacterial, we did perform flu test which was negative, she took a home COVID test which was negative, plan for course of Augmentin, supportive care with scheduled Flonase, Mucinex, vitamin D and C.  She is to contact us for any persistent symptoms and otherwise follow-up as needed.      Relevant Medications   amoxicillin-clavulanate (AUGMENTIN) 875-125 MG tablet   Other Visit Diagnoses     Acute cough       Relevant Orders   POCT Influenza A/B (Completed)        Orders &  Medications Meds ordered this encounter  Medications   amoxicillin-clavulanate (AUGMENTIN) 875-125 MG tablet    Sig: Take 1 tablet by mouth 2 (two) times daily.    Dispense:  20 tablet    Refill:  0   Orders Placed This Encounter  Procedures   POCT Influenza A/B     Return if symptoms worsen or fail to improve.     Jerrol Banana, MD   Primary Care Sports Medicine [facility]

## 2022-09-23 NOTE — Assessment & Plan Note (Signed)
3 day history of fevers, chills, myalgias, headache refractory to her usual treatments. Denies shortness of air, mildly productive cough.  Examination reveals a mildly ill-appearing patient, swollen erythematous turbinates with mild tenderness about the sinuses diffusely, tympanic membranes with fluid posteriorly, no erythema at the membrane or canal, oropharynx with cobblestone pattern, lung fields are clear to auscultation bilaterally without wheezes, rales, rhonchi, O2 sat at baseline.  Her clinical history raises concern for acute rhinosinusitis, etiology can be viral or bacterial, we did perform flu test which was negative, she took a home COVID test which was negative, plan for course of Augmentin, supportive care with scheduled Flonase, Mucinex, vitamin D and C.  She is to contact us for any persistent symptoms and otherwise follow-up as needed.

## 2022-09-24 ENCOUNTER — Encounter: Payer: Self-pay | Admitting: Internal Medicine

## 2022-09-24 NOTE — Telephone Encounter (Signed)
Please review.  KP

## 2022-09-30 ENCOUNTER — Encounter: Payer: Self-pay | Admitting: Internal Medicine

## 2022-10-23 ENCOUNTER — Other Ambulatory Visit: Payer: Self-pay | Admitting: Internal Medicine

## 2022-10-23 DIAGNOSIS — G43009 Migraine without aura, not intractable, without status migrainosus: Secondary | ICD-10-CM

## 2022-10-23 MED ORDER — TRAMADOL HCL 50 MG PO TABS: 50.0000 mg | ORAL_TABLET | Freq: Every day | ORAL | 0 refills | Status: DC | PRN

## 2022-10-23 NOTE — Telephone Encounter (Signed)
Please review. Last office visit 09/23/2022.  KP

## 2022-10-30 ENCOUNTER — Encounter: Payer: Self-pay | Admitting: Internal Medicine

## 2022-11-11 ENCOUNTER — Encounter: Payer: Self-pay | Admitting: Internal Medicine

## 2022-11-26 ENCOUNTER — Other Ambulatory Visit: Payer: Self-pay | Admitting: Internal Medicine

## 2022-11-26 DIAGNOSIS — G43009 Migraine without aura, not intractable, without status migrainosus: Secondary | ICD-10-CM

## 2022-11-26 MED ORDER — TRAMADOL HCL 50 MG PO TABS: 50.0000 mg | ORAL_TABLET | Freq: Every day | ORAL | 0 refills | Status: DC | PRN

## 2022-12-23 ENCOUNTER — Other Ambulatory Visit: Payer: Self-pay | Admitting: Internal Medicine

## 2022-12-23 DIAGNOSIS — G43009 Migraine without aura, not intractable, without status migrainosus: Secondary | ICD-10-CM

## 2022-12-24 MED ORDER — TRAMADOL HCL 50 MG PO TABS: 50.0000 mg | ORAL_TABLET | Freq: Every day | ORAL | 2 refills | Status: DC | PRN

## 2023-01-12 ENCOUNTER — Ambulatory Visit: Payer: Medicaid Other | Admitting: Internal Medicine

## 2023-01-12 ENCOUNTER — Telehealth: Payer: Self-pay | Admitting: Internal Medicine

## 2023-01-12 NOTE — Telephone Encounter (Signed)
Copied from [place] [id number]. Topic: Appointment Scheduling - Scheduling Inquiry for Clinic >> Jan 12, 2023  8:48 AM Tiffany B wrote: Patient was scheduled today at 4 pm to see PCP. Patient appointment was [facility] to 10:20 am by practice. Patient did not realize at the time 10:20 am did not work for her and would prefer to see PCP on Thursday or Friday of this week.   Patient states right breast size changes and nipple is itchy, patient denies breast pain.

## 2023-01-16 ENCOUNTER — Encounter: Payer: Self-pay | Admitting: Internal Medicine

## 2023-01-16 ENCOUNTER — Ambulatory Visit (INDEPENDENT_AMBULATORY_CARE_PROVIDER_SITE_OTHER): Payer: Medicaid Other | Admitting: Internal Medicine

## 2023-01-16 VITALS — BP 118/78 | HR 100 | Ht 60.0 in | Wt 187.0 lb

## 2023-01-16 DIAGNOSIS — F332 Major depressive disorder, recurrent severe without psychotic features: Secondary | ICD-10-CM | POA: Diagnosis not present

## 2023-01-16 DIAGNOSIS — F419 Anxiety disorder, unspecified: Secondary | ICD-10-CM

## 2023-01-16 DIAGNOSIS — L689 Hypertrichosis, unspecified: Secondary | ICD-10-CM | POA: Diagnosis not present

## 2023-01-16 DIAGNOSIS — G43009 Migraine without aura, not intractable, without status migrainosus: Secondary | ICD-10-CM | POA: Diagnosis not present

## 2023-01-16 DIAGNOSIS — Z23 Encounter for immunization: Secondary | ICD-10-CM | POA: Diagnosis not present

## 2023-01-16 MED ORDER — EFLORNITHINE HCL 13.9 % EX CREA: 1.0000 | TOPICAL_CREAM | Freq: Two times a day (BID) | CUTANEOUS | 1 refills | Status: DC

## 2023-01-16 MED ORDER — BUSPIRONE HCL 10 MG PO TABS: 10.0000 mg | ORAL_TABLET | Freq: Three times a day (TID) | ORAL | 0 refills | Status: DC

## 2023-01-16 NOTE — Progress Notes (Signed)
Date:  01/16/2023   Name:  Amy Gomez   DOB:  30-Mar-1981   MRN:  [id number]   Chief Complaint: Breast Problem (Right, 3-4 weeks, itching odd shape with hair around nipple)  Anxiety Presents for follow-up visit. Symptoms include decreased concentration and nervous/anxious behavior. Patient reports no chest pain, dizziness, palpitations, shortness of breath or suicidal ideas. Symptoms occur constantly (planning to become guardian of 2 nieces from [place]). The severity of symptoms is moderate.    Migraine  This is a recurrent problem. The problem has been unchanged. The pain does not radiate. The pain quality is similar to prior headaches. Pertinent negatives include no abdominal pain, coughing, dizziness or weakness. Treatments tried: multiple agents without benefit or with side effects. Improvement on treatment: now taking Tramadol PRN.   Breast abnormality - right breast skin change with hair; no mass, pain or discharge.  Excess facial hair - this is worsening and causing distress.  Lab Results  Component Value Date   NA 136 10/08/2021   K 3.8 10/08/2021   CO2 24 10/08/2021   GLUCOSE 93 10/08/2021   BUN 10 10/08/2021   CREATININE 0.61 10/08/2021   CALCIUM 9.5 10/08/2021   GFRNONAA >60 10/08/2021   No results found for: "CHOL", "HDL", "LDLCALC", "LDLDIRECT", "TRIG", "CHOLHDL" Lab Results  Component Value Date   TSH 0.954 04/24/2021   Lab Results  Component Value Date   HGBA1C 5.9 (H) 10/08/2021   Lab Results  Component Value Date   WBC 13.2 (H) 12/17/2020   HGB 13.5 12/17/2020   HCT 40.1 12/17/2020   MCV 83.9 12/17/2020   PLT 277 12/17/2020   Lab Results  Component Value Date   ALT 17 12/17/2020   AST 17 12/17/2020   ALKPHOS 69 12/17/2020   BILITOT 0.6 12/17/2020   No results found for: "25OHVITD2", "25OHVITD3", "VD25OH"   Review of Systems  Constitutional:  Negative for fatigue and unexpected weight change.  HENT:  Negative for nosebleeds.   Eyes:   Negative for visual disturbance.  Respiratory:  Negative for cough, chest tightness, shortness of breath and wheezing.   Cardiovascular:  Negative for chest pain, palpitations and leg swelling.  Gastrointestinal:  Negative for abdominal pain, constipation and diarrhea.  Neurological:  Negative for dizziness, weakness, light-headedness and headaches.  Psychiatric/Behavioral:  Positive for decreased concentration. Negative for suicidal ideas. The patient is nervous/anxious.     Patient Active Problem List   Diagnosis Date Noted   Excess body and facial hair 01/16/2023   Severe episode of recurrent major depressive disorder, without psychotic features ([place]) 04/11/2022   Recurrent periodic urticaria 12/09/2021   Left cervical radiculopathy 02/04/2021   Supraspinatus tendinitis, left 02/04/2021   Cervical paraspinal muscle spasm 02/04/2021   Rosacea 01/09/2020   Panic disorder with agoraphobia 07/11/2019   Pre-diabetes 01/26/2019   Liver mass 01/25/2019   Essential hypertension 07/15/2017   Gastroesophageal reflux disease without esophagitis 12/18/2016   History of kidney stones 06/04/2015   Dysmenorrhea 06/04/2015   Migraine without aura and without status migrainosus, not intractable 06/04/2015    Allergies  Allergen Reactions   Sulfa Antibiotics Anaphylaxis, Swelling, Rash and Other (See Comments)   Azithromycin Rash    Rash as a teenager, "Z Pack"    Past Surgical History:  Procedure Laterality Date   DILATION AND CURETTAGE OF UTERUS     TUBAL LIGATION Bilateral 2007    Social History   Tobacco Use   Smoking status: Every Day    Packs/day:  0.50    Years: 24.00    Total pack years: 12.00    Types: Cigarettes    Start date: 1996   Smokeless tobacco: Never  Vaping Use   Vaping Use: Former  Substance Use Topics   Alcohol use: No    Alcohol/week: 0.0 standard drinks of alcohol   Drug use: No     Medication list has been reviewed and updated.  Current Meds   Medication Sig   Eflornithine HCl 13.9 % cream Apply 1 Application topically in the morning and at bedtime.   lisinopril (ZESTRIL) 30 MG tablet Take 1 tablet (30 mg total) by mouth daily.   traMADol (ULTRAM) 50 MG tablet Take 1 tablet (50 mg total) by mouth daily as needed (Migraine).       01/16/2023    2:03 PM 07/18/2022    9:38 AM 04/11/2022   10:36 AM 04/02/2022    1:28 PM  GAD 7 : Generalized Anxiety Score  Nervous, Anxious, on Edge '3 3 1 1  '$ Control/stop worrying '3 3 2 3  '$ Worry too much - different things '3 3 2 3  '$ Trouble relaxing '3 3 2 3  '$ Restless '3 3 1 2  '$ Easily annoyed or irritable '3 3 2 2  '$ Afraid - awful might happen '3 3 1 2  '$ Total GAD 7 Score '21 21 11 16  '$ Anxiety Difficulty Extremely difficult Extremely difficult Somewhat difficult Somewhat difficult       01/16/2023    2:02 PM 07/18/2022    9:37 AM 04/11/2022   10:36 AM  Depression screen PHQ 2/9  Decreased Interest 0 2 1  Down, Depressed, Hopeless 0 1 1  PHQ - 2 Score 0 3 2  Altered sleeping 0 3 0  Tired, decreased energy 0 3 1  Change in appetite 0 0 0  Feeling bad or failure about yourself  0 2 0  Trouble concentrating 0 2 1  Moving slowly or fidgety/restless 0 2 1  Suicidal thoughts 0 0 0  PHQ-9 Score 0 15 5  Difficult doing work/chores Not difficult at all Extremely dIfficult Somewhat difficult    BP Readings from Last 3 Encounters:  01/16/23 118/78  09/23/22 118/78  07/21/22 135/88    Physical Exam Vitals and nursing note reviewed.  Constitutional:      General: She is not in acute distress.    Appearance: Normal appearance. She is well-developed.  HENT:     Head: Normocephalic and atraumatic.     Comments: Excess facial hair on chin Cardiovascular:     Rate and Rhythm: Normal rate and regular rhythm.  Pulmonary:     Effort: Pulmonary effort is normal. No respiratory distress.     Breath sounds: No wheezing or rhonchi.  Chest:  Breasts:    Right: No nipple discharge, skin change or tenderness.      Left: No nipple discharge, skin change or tenderness.     Comments: Few scattered hairs around nipple Musculoskeletal:     Cervical back: Normal range of motion.     Right lower leg: No edema.     Left lower leg: No edema.  Lymphadenopathy:     Cervical: No cervical adenopathy.  Skin:    General: Skin is warm and dry.     Findings: No rash.  Neurological:     Mental Status: She is alert and oriented to person, place, and time.  Psychiatric:        Mood and Affect: Mood normal.  Behavior: Behavior normal.     Wt Readings from Last 3 Encounters:  01/16/23 187 lb (84.8 kg)  09/23/22 187 lb 9.6 oz (85.1 kg)  07/18/22 189 lb (85.7 kg)    BP 118/78   Pulse 100   Ht 5' (1.524 m)   Wt 187 lb (84.8 kg)   SpO2 98%   BMI 36.52 kg/m   Assessment and Plan: Problem List Items Addressed This Visit       Cardiovascular and Mediastinum   Migraine without aura and without status migrainosus, not intractable (Chronic)    Using Tramadol as needed - 15 per month Needs a PA on Medicaid        Musculoskeletal and Integument   Excess body and facial hair    Use vaniqa only on facial hair daily      Relevant Medications   Eflornithine HCl 13.9 % cream     Other   Severe episode of recurrent major depressive disorder, without psychotic features ([facility]) (Chronic)    Having more anxiety thinking about taking guardianship of 2 nieces with mental health issues Will resume Buspar      Relevant Medications   busPIRone (BUSPAR) 10 MG tablet   Other Visit Diagnoses     Need for Tdap vaccination    -  Primary   Relevant Orders   Tdap vaccine greater than or equal to 7yo IM (Completed)   Anxiety       Relevant Medications   busPIRone (BUSPAR) 10 MG tablet        Partially dictated using Editor, commissioning. Any errors are unintentional.  Halina Maidens, MD [facility]  01/16/2023

## 2023-01-16 NOTE — Assessment & Plan Note (Signed)
Use vaniqa only on facial hair daily

## 2023-01-16 NOTE — Assessment & Plan Note (Signed)
Having more anxiety thinking about taking guardianship of 2 nieces with mental health issues Will resume Buspar

## 2023-01-16 NOTE — Assessment & Plan Note (Addendum)
Using Tramadol as needed - 15 per month Needs a PA on Medicaid

## 2023-02-15 ENCOUNTER — Ambulatory Visit
Admission: EM | Admit: 2023-02-15 | Discharge: 2023-02-15 | Disposition: A | Discharge status: Home / Self Care | Payer: Medicaid Other | Attending: Family Medicine | Admitting: Family Medicine

## 2023-02-15 ENCOUNTER — Ambulatory Visit (INDEPENDENT_AMBULATORY_CARE_PROVIDER_SITE_OTHER): Payer: Medicaid Other

## 2023-02-15 DIAGNOSIS — M25551 Pain in right hip: Secondary | ICD-10-CM

## 2023-02-15 DIAGNOSIS — M545 Low back pain, unspecified: Secondary | ICD-10-CM

## 2023-02-15 DIAGNOSIS — M5136 Other intervertebral disc degeneration, lumbar region: Secondary | ICD-10-CM

## 2023-02-15 DIAGNOSIS — W19XXXA Unspecified fall, initial encounter: Secondary | ICD-10-CM | POA: Diagnosis not present

## 2023-02-15 DIAGNOSIS — M869 Osteomyelitis, unspecified: Secondary | ICD-10-CM

## 2023-02-15 DIAGNOSIS — N200 Calculus of kidney: Secondary | ICD-10-CM

## 2023-02-15 DIAGNOSIS — M5416 Radiculopathy, lumbar region: Secondary | ICD-10-CM | POA: Diagnosis not present

## 2023-02-15 MED ORDER — GABAPENTIN 100 MG PO CAPS: 100.0000 mg | ORAL_CAPSULE | Freq: Three times a day (TID) | ORAL | 0 refills | Status: DC | PRN

## 2023-02-15 MED ORDER — KETOROLAC TROMETHAMINE 30 MG/ML IJ SOLN
30.0000 mg | Freq: Once | INTRAMUSCULAR | Status: AC
  Administered 2023-02-15: 30 mg via INTRAMUSCULAR

## 2023-02-15 MED ORDER — CYCLOBENZAPRINE HCL 10 MG PO TABS: 10.0000 mg | ORAL_TABLET | Freq: Two times a day (BID) | ORAL | 0 refills | Status: DC | PRN

## 2023-02-15 MED ORDER — NAPROXEN 500 MG PO TABS: 500.0000 mg | ORAL_TABLET | Freq: Two times a day (BID) | ORAL | 0 refills | Status: DC

## 2023-02-15 NOTE — Discharge Instructions (Addendum)
Your hip showed some age-related changes and some inflammation in the joint between your pubic bones.  You have some right-sided kidney stones that have been seen on imaging in the past.  Stop by the pharmacy to pick up your prescriptions.  Gabapentin/Neurontin may make you sleepy.  Do not drive or operate heavy machinery while taking this medication.    If your pain does not resolve in the next 2 weeks, follow-up with your orthopedic provider or consider Dr. Joseph Berkshire who is upstairs in this building.

## 2023-02-15 NOTE — ED Provider Notes (Signed)
[facility]    CSN: [id number] Arrival date & time: 02/15/23  1256      History   Chief Complaint Chief Complaint  Patient presents with   Back Pain    HPI  HPI Amy Gomez is a 42 y.o. female.   Amy Gomez presents for constant low back pain that started a week ago after traveling in a car to a memorial in [place]. It was a 16 hour drive. Says she stopped 12 times to walk around on the way there. They didn't stop on the way back.  The previous week she fell on the right side while cleaning.  She took a shower Thursday and felt a crunch and pop that relieved the pressure. She is unable lift her right leg without pain.    Pain is described as dullness and "like lightning" and with radiation, below the right knee. Pain rated 7/10.  Endorses tingling, leg pain, leg weakness. Denies weakness, numbness, dysuria, abdominal pain, chest pain, incontinence, perianal numbness. Took Aleve PM at night.  Tylenol and ibuprofen during the day.    Continues to have pain with movement. She injured her back years ago when a 90-lb jumped on her.  Pain is worse at night. Laying on her stomach relieves the pain.      Past Medical History:  Diagnosis Date   Hypertension    Kidney stones    Leukocytosis 08/16/2018   Migraine     Patient Active Problem List   Diagnosis Date Noted   Excess body and facial hair 01/16/2023   Severe episode of recurrent major depressive disorder, without psychotic features 04/11/2022   Recurrent periodic urticaria 12/09/2021   Left cervical radiculopathy 02/04/2021   Supraspinatus tendinitis, left 02/04/2021   Cervical paraspinal muscle spasm 02/04/2021   Rosacea 01/09/2020   Panic disorder with agoraphobia 07/11/2019   Pre-diabetes 01/26/2019   Liver mass 01/25/2019   Essential hypertension 07/15/2017   Gastroesophageal reflux disease without esophagitis 12/18/2016   History of kidney stones 06/04/2015   Dysmenorrhea 06/04/2015   Migraine  without aura and without status migrainosus, not intractable 06/04/2015    Past Surgical History:  Procedure Laterality Date   DILATION AND CURETTAGE OF UTERUS     TUBAL LIGATION Bilateral 2007    OB History     Gravida  4   Para  3   Term  2   Preterm  1   AB  1   Living  3      SAB  1   IAB      Ectopic      Multiple      Live Births  3            Home Medications    Prior to Admission medications   Medication Sig Start Date End Date Taking? Authorizing Provider  cyclobenzaprine (FLEXERIL) 10 MG tablet Take 1 tablet (10 mg total) by mouth 2 (two) times daily as needed for muscle spasms. 02/15/23  Yes Salome Hautala, DO  gabapentin (NEURONTIN) 100 MG capsule Take 1 capsule (100 mg total) by mouth 3 (three) times daily as needed. 02/15/23  Yes Kincaid Tiger, DO  lisinopril (ZESTRIL) 30 MG tablet Take 1 tablet (30 mg total) by mouth daily. 03/12/22  Yes Reubin Milan, MD  naproxen (NAPROSYN) 500 MG tablet Take 1 tablet (500 mg total) by mouth 2 (two) times daily with a meal. 02/15/23  Yes Jezebelle Ledwell, DO  traMADol (ULTRAM) 50 MG tablet Take 1  tablet (50 mg total) by mouth daily as needed (Migraine). 12/24/22  Yes Reubin Milan, MD  busPIRone (BUSPAR) 10 MG tablet Take 1 tablet (10 mg total) by mouth 3 (three) times daily. 01/16/23   Reubin Milan, MD  Eflornithine HCl 13.9 % cream Apply 1 Application topically in the morning and at bedtime. 01/16/23   Reubin Milan, MD  HYDROcodone-acetaminophen (NORCO/VICODIN) 5-325 MG tablet Take 1 tablet by mouth at bedtime as needed for up to 15 days for moderate pain. 02/17/23 03/04/23  Reubin Milan, MD  diphenhydrAMINE HCl (BENADRYL PO) Take by mouth as needed.  09/03/20  [provider]  omeprazole (PRILOSEC) 10 MG capsule Take 1 capsule (10 mg total) by mouth daily. Patient not taking: Reported on 09/26/2019 09/24/19 12/06/19  Cuthriell, Delorise Royals, PA-C    Family History Family History  Problem  Relation Age of Onset   Other Mother        unknown medical history   Other Father        unknown medical hisotry   Endometriosis Maternal Grandmother     Social History Social History   Tobacco Use   Smoking status: Every Day    Packs/day: 0.50    Years: 24.00    Additional pack years: 0.00    Total pack years: 12.00    Types: Cigarettes    Start date: 1996   Smokeless tobacco: Never  Vaping Use   Vaping Use: Former  Substance Use Topics   Alcohol use: No    Alcohol/week: 0.0 standard drinks of alcohol   Drug use: No     Allergies   Sulfa antibiotics and Azithromycin   Review of Systems Review of Systems: egative unless otherwise stated in HPI.      Physical Exam Triage Vital Signs ED Triage Vitals  Enc Vitals Group     BP 02/15/23 1337 (!) 143/89     Pulse Rate 02/15/23 1337 90     Resp 02/15/23 1337 18     Temp 02/15/23 1337 98.4 F (36.9 C)     Temp Source 02/15/23 1337 Oral     SpO2 02/15/23 1337 99 %     Weight 02/15/23 1334 187 lb (84.8 kg)     Height 02/15/23 1334 4\' 11"  (1.499 m)     Head Circumference --      Peak Flow --      Pain Score 02/15/23 1334 10     Pain Loc --      Pain Edu? --      Excl. in GC? --    No data found.  Updated Vital Signs BP (!) 146/87 (BP Location: Left Arm)   Pulse 90   Temp 98.4 F (36.9 C) (Oral)   Resp 18   Ht 4\' 11"  (1.499 m)   Wt 84.8 kg   LMP 01/19/2023 (Approximate)   SpO2 99%   BMI 37.77 kg/m   Visual Acuity Right Eye Distance:   Left Eye Distance:   Bilateral Distance:    Right Eye Near:   Left Eye Near:    Bilateral Near:     Physical Exam GEN: well appearing female in no acute distress  CVS: well perfused  RESP: speaking in full sentences without pause, no respiratory distress  MSK:  Lumbar spine: - Inspection: no gross deformity or asymmetry, swelling or ecchymosis. No skin changes  - Palpation: TTP over the lumbar spinous processes with bilateral lumbar paraspinal muscle  tenderness, no SI joint  tenderness bilaterally - ROM: limited active ROM of the lumbar spine in flexion and extension due to pain - Strength: 5/5 strength of lower extremity in L4-S1 nerve root distributions b/l - Neuro: sensation intact in the L4-S1 nerve root distribution b/l - Special testing: positive straight leg raise right  Hip, right : TTP noted at iliac crest. No obvious rash, deformity, erythema, ecchymosis, or edema. ROM full in all directions; Strength 4+/5 in IR/ER/Flex/Ext/Abd/Add. Pelvic alignment unremarkable to inspection and palpation. Antalgic gait without unsteadiness. Greater trochanter without tenderness to palpation.Neurovascularly intact. SKIN: warm, dry, no overly skin rash or erythema    UC Treatments / Results  Labs (all labs ordered are listed, but only abnormal results are displayed) Labs Reviewed - No data to display  EKG   Radiology DG Hip Unilat With Pelvis 2-3 Views Right  Result Date: 02/15/2023 CLINICAL DATA:  Right hip pain following a car trip. EXAM: DG HIP (WITH OR WITHOUT PELVIS) 2-3V RIGHT COMPARISON:  None Available. FINDINGS: Minimal right femoral head and neck junction spur formation. No fracture or dislocation. Bilateral osteitis pubis. IMPRESSION: No fracture. Minimal right hip degenerative changes. Electronically Signed   By: Beckie SaltsSteven  Reid M.D.   On: 02/15/2023 15:57   DG Lumbar Spine Complete  Result Date: 02/15/2023 CLINICAL DATA:  Fall, back pain. EXAM: LUMBAR SPINE - COMPLETE 4+ VIEW COMPARISON:  Renal stone CT 06/13/2018 FINDINGS: There is no evidence of lumbar spine fracture. Pars interarticularis defects are again seen at L5. Alignment is normal. Intervertebral disc spaces are maintained. Right superior pole calculus is again noted measuring 4 mm. IMPRESSION: 1. No acute fracture. 2. Bilateral pars interarticularis defects at L5. 3. Right nephrolithiasis. Electronically Signed   By: Darliss CheneyAmy  Guttmann M.D.   On: 02/15/2023 15:52       Procedures Procedures (including critical care time)  Medications Ordered in UC Medications  ketorolac (TORADOL) 30 MG/ML injection 30 mg (30 mg Intramuscular Given 02/15/23 1518)    Initial Impression / Assessment and Plan / UC Course  I have reviewed the triage vital signs and the nursing notes.  Pertinent labs & imaging results that were available during my care of the patient were reviewed by me and considered in my medical decision making (see chart for details).      Pt is a 42 y.o.  female with acute onset low back pain and right hip pain after car ride to the [place].  Obtained right hip and lumbar plain films.  Xray personally interpreted by me were unremarkable for fracture, significant lumbar malalignment or dislocation.  Radiologist report reviewed and notes bilateral pars interarticularis at L5, osteitis pubis and maintained intervertebral disc spaces.  Patient was given 30 mg IM Toradol for acute lumbar radiculopathy.  Radiology results were reviewed with patient.  Patient to gradually return to normal activities, as tolerated and continue ordinary activities within the limits permitted by pain. Prescribed Naproxen sodium, gabapentin and muscle relaxer  for pain relief.  Advised patient to avoid other NSAIDs while taking Naprosyn. Tylenol and Lidocaine patches PRN for multimodal pain relief. Counseled patient on red flag symptoms and when to seek immediate care.  No red flags suggesting cauda equina syndrome or progressive major motor weakness. Patient to follow up with orthopedic provider if symptoms do not improve with conservative treatment.  Return and ED precautions given.   Discussed MDM, treatment plan and plan for follow-up with patient who agrees with plan.   Final Clinical Impressions(s) / UC Diagnoses   Final diagnoses:  Lumbar radiculopathy, acute  Right hip pain  Osteitis pubis  Right kidney stone  DDD (degenerative disc disease), lumbar      Discharge Instructions      Your hip showed some age-related changes and some inflammation in the joint between your pubic bones.  You have some right-sided kidney stones that have been seen on imaging in the past.  Stop by the pharmacy to pick up your prescriptions.  Gabapentin/Neurontin may make you sleepy.  Do not drive or operate heavy machinery while taking this medication.    If your pain does not resolve in the next 2 weeks, follow-up with your orthopedic provider or consider Dr. Joseph BerkshireJason Matthews who is upstairs in this building.     ED Prescriptions     Medication Sig Dispense Auth. Provider   cyclobenzaprine (FLEXERIL) 10 MG tablet Take 1 tablet (10 mg total) by mouth 2 (two) times daily as needed for muscle spasms. 20 tablet Findlay Dagher, DO   gabapentin (NEURONTIN) 100 MG capsule Take 1 capsule (100 mg total) by mouth 3 (three) times daily as needed. 30 capsule Jethro Radke, DO   naproxen (NAPROSYN) 500 MG tablet Take 1 tablet (500 mg total) by mouth 2 (two) times daily with a meal. 30 tablet Judge Duque, DO      PDMP not reviewed this encounter.      Katha CabalBrimage, Anesia Blackwell, DO 02/25/23 [id number]

## 2023-02-15 NOTE — ED Triage Notes (Signed)
"  Left Thursday night heading to [place] arrived Friday and may have hurt back at some point while there". Since arriving back "having increasing worse back pain, sciatic area, right/left with pain radiation to spine "hurts to move".

## 2023-02-17 ENCOUNTER — Encounter: Payer: Self-pay | Admitting: Internal Medicine

## 2023-02-17 ENCOUNTER — Ambulatory Visit (INDEPENDENT_AMBULATORY_CARE_PROVIDER_SITE_OTHER): Payer: Medicaid Other | Admitting: Internal Medicine

## 2023-02-17 VITALS — BP 128/78 | HR 100 | Ht 59.0 in | Wt 180.0 lb

## 2023-02-17 DIAGNOSIS — M533 Sacrococcygeal disorders, not elsewhere classified: Secondary | ICD-10-CM

## 2023-02-17 MED ORDER — HYDROCODONE-ACETAMINOPHEN 5-325 MG PO TABS: 1.0000 | ORAL_TABLET | Freq: Every evening | ORAL | 0 refills | Status: AC | PRN

## 2023-02-17 NOTE — Patient Instructions (Addendum)
Continue the Naproxen bid and Flexeril at bedtime. Take Norco only as needed for severe pain. Use heat - hot bath or heating pad

## 2023-02-17 NOTE — Progress Notes (Signed)
Date:  02/17/2023   Name:  Amy Gomez   DOB:  1981/11/08   MRN:  [id number]   Chief Complaint: Hip Pain (Right for 2 weeks, no injury, went to UC, "old age" )  Hip Pain  There was no injury mechanism (seen in UC - xrays done.  started after a long car ride). The pain is present in the right hip. Treatments tried: UC prescribed gabapentin and flexeril.  Taking Advil and Tylenol then Naproxen from UC. Lumbar: IMPRESSION: 1. No acute fracture. 2. Bilateral pars interarticularis defects at L5. 3. Right nephrolithiasis.   Hip: Narrative & Impression  CLINICAL DATA:  Right hip pain following a car trip.   EXAM: DG HIP (WITH OR WITHOUT PELVIS) 2-3V RIGHT   COMPARISON:  None Available.   FINDINGS: Minimal right femoral head and neck junction spur formation. No fracture or dislocation. Bilateral osteitis pubis.   IMPRESSION: No fracture. Minimal right hip degenerative changes.   Lab Results  Component Value Date   NA 136 10/08/2021   K 3.8 10/08/2021   CO2 24 10/08/2021   GLUCOSE 93 10/08/2021   BUN 10 10/08/2021   CREATININE 0.61 10/08/2021   CALCIUM 9.5 10/08/2021   GFRNONAA >60 10/08/2021   No results found for: "CHOL", "HDL", "LDLCALC", "LDLDIRECT", "TRIG", "CHOLHDL" Lab Results  Component Value Date   TSH 0.954 04/24/2021   Lab Results  Component Value Date   HGBA1C 5.9 (H) 10/08/2021   Lab Results  Component Value Date   WBC 13.2 (H) 12/17/2020   HGB 13.5 12/17/2020   HCT 40.1 12/17/2020   MCV 83.9 12/17/2020   PLT 277 12/17/2020   Lab Results  Component Value Date   ALT 17 12/17/2020   AST 17 12/17/2020   ALKPHOS 69 12/17/2020   BILITOT 0.6 12/17/2020   No results found for: "25OHVITD2", "25OHVITD3", "VD25OH"   Review of Systems  Constitutional:  Negative for chills, fatigue and fever.  Respiratory:  Negative for chest tightness and shortness of breath.   Cardiovascular:  Negative for chest pain.  Musculoskeletal:  Positive for  arthralgias and back pain.  Neurological:  Positive for headaches. Negative for dizziness.  Psychiatric/Behavioral:  Negative for dysphoric mood and sleep disturbance. The patient is not nervous/anxious.     Patient Active Problem List   Diagnosis Date Noted   Excess body and facial hair 01/16/2023   Severe episode of recurrent major depressive disorder, without psychotic features 04/11/2022   Recurrent periodic urticaria 12/09/2021   Left cervical radiculopathy 02/04/2021   Supraspinatus tendinitis, left 02/04/2021   Cervical paraspinal muscle spasm 02/04/2021   Rosacea 01/09/2020   Panic disorder with agoraphobia 07/11/2019   Pre-diabetes 01/26/2019   Liver mass 01/25/2019   Essential hypertension 07/15/2017   Gastroesophageal reflux disease without esophagitis 12/18/2016   History of kidney stones 06/04/2015   Dysmenorrhea 06/04/2015   Migraine without aura and without status migrainosus, not intractable 06/04/2015    Allergies  Allergen Reactions   Sulfa Antibiotics Anaphylaxis, Swelling, Rash and Other (See Comments)   Azithromycin Rash    Rash as a teenager, "Z Pack"    Past Surgical History:  Procedure Laterality Date   DILATION AND CURETTAGE OF UTERUS     TUBAL LIGATION Bilateral 2007    Social History   Tobacco Use   Smoking status: Every Day    Packs/day: 0.50    Years: 24.00    Additional pack years: 0.00    Total pack years: 12.00  Types: Cigarettes    Start date: 1996   Smokeless tobacco: Never  Vaping Use   Vaping Use: Former  Substance Use Topics   Alcohol use: No    Alcohol/week: 0.0 standard drinks of alcohol   Drug use: No     Medication list has been reviewed and updated.  Current Meds  Medication Sig   busPIRone (BUSPAR) 10 MG tablet Take 1 tablet (10 mg total) by mouth 3 (three) times daily.   cyclobenzaprine (FLEXERIL) 10 MG tablet Take 1 tablet (10 mg total) by mouth 2 (two) times daily as needed for muscle spasms.   Eflornithine  HCl 13.9 % cream Apply 1 Application topically in the morning and at bedtime.   gabapentin (NEURONTIN) 100 MG capsule Take 1 capsule (100 mg total) by mouth 3 (three) times daily as needed.   lisinopril (ZESTRIL) 30 MG tablet Take 1 tablet (30 mg total) by mouth daily.   naproxen (NAPROSYN) 500 MG tablet Take 1 tablet (500 mg total) by mouth 2 (two) times daily with a meal.   traMADol (ULTRAM) 50 MG tablet Take 1 tablet (50 mg total) by mouth daily as needed (Migraine).       01/16/2023    2:03 PM 07/18/2022    9:38 AM 04/11/2022   10:36 AM 04/02/2022    1:28 PM  GAD 7 : Generalized Anxiety Score  Nervous, Anxious, on Edge 3 3 1 1   Control/stop worrying 3 3 2 3   Worry too much - different things 3 3 2 3   Trouble relaxing 3 3 2 3   Restless 3 3 1 2   Easily annoyed or irritable 3 3 2 2   Afraid - awful might happen 3 3 1 2   Total GAD 7 Score 21 21 11 16   Anxiety Difficulty Extremely difficult Extremely difficult Somewhat difficult Somewhat difficult       01/16/2023    2:02 PM 07/18/2022    9:37 AM 04/11/2022   10:36 AM  Depression screen PHQ 2/9  Decreased Interest 0 2 1  Down, Depressed, Hopeless 0 1 1  PHQ - 2 Score 0 3 2  Altered sleeping 0 3 0  Tired, decreased energy 0 3 1  Change in appetite 0 0 0  Feeling bad or failure about yourself  0 2 0  Trouble concentrating 0 2 1  Moving slowly or fidgety/restless 0 2 1  Suicidal thoughts 0 0 0  PHQ-9 Score 0 15 5  Difficult doing work/chores Not difficult at all Extremely dIfficult Somewhat difficult    BP Readings from Last 3 Encounters:  02/17/23 128/78  02/15/23 (!) 146/87  01/16/23 118/78    Physical Exam Vitals and nursing note reviewed.  Constitutional:      General: She is in acute distress.     Appearance: She is well-developed.  HENT:     Head: Normocephalic and atraumatic.  Cardiovascular:     Rate and Rhythm: Normal rate and regular rhythm.  Pulmonary:     Effort: Pulmonary effort is normal. No respiratory  distress.     Breath sounds: No wheezing or rhonchi.  Musculoskeletal:     Lumbar back: Spasms present. Positive right straight leg raise test and positive left straight leg raise test.     Right lower leg: No edema.     Left lower leg: No edema.     Comments: Tender right SI region  Skin:    General: Skin is warm and dry.     Findings: No rash.  Neurological:     Mental Status: She is alert and oriented to person, place, and time.  Psychiatric:        Mood and Affect: Mood normal.        Behavior: Behavior normal.     Wt Readings from Last 3 Encounters:  02/17/23 180 lb (81.6 kg)  02/15/23 187 lb (84.8 kg)  01/16/23 187 lb (84.8 kg)    BP 128/78   Pulse 100   Ht 4\' 11"  (1.499 m)   Wt 180 lb (81.6 kg)   LMP 01/19/2023 (Approximate)   SpO2 96%   BMI 36.36 kg/m   Assessment and Plan:  Problem List Items Addressed This Visit   None Visit Diagnoses     Sacro-iliac pain    -  Primary   use heat, naproxen and flexeril use Norco only at [facility] for severe pain       No follow-ups on file.   Partially dictated using Dragon software, any errors are not intentional.  Reubin MilanLaura H. Chosen Geske, MD [facility] [place], [place]

## 2023-02-23 ENCOUNTER — Encounter: Payer: Self-pay | Admitting: Internal Medicine

## 2023-02-23 NOTE — Telephone Encounter (Signed)
Pt response.  KP

## 2023-02-25 ENCOUNTER — Ambulatory Visit: Payer: Self-pay | Admitting: *Deleted

## 2023-02-25 NOTE — Telephone Encounter (Signed)
  Chief Complaint: severe pain right hip Symptoms: radiating pain to front of hip unable to move leg needs help dressing. Unable to walk without assist.  Frequency: today  Pertinent Negatives: Patient denies na Disposition: ED /[] Urgent Care (no appt availability in office) / Appointment(In office/virtual)/  [facility]/ Home Care/ Refused Recommended Disposition /[] [facility]/  Follow-up with PCP Additional Notes:   Recommended ED due to no pain medication or alternative medication helping. Can not walk with out assist.    Reason for Disposition  Can't stand (bear weight) or walk  Answer Assessment - Initial Assessment Questions 1. LOCATION and RADIATION: "Where is the pain located?"      Right hip and radiating to the front of hip 2. QUALITY: "What does the pain feel like?"  (e.g., sharp, dull, aching, burning)     Sever pain unable to lift leg  3. SEVERITY: "How bad is the pain?" "What does it keep you from doing?"   (Scale 1-10; or mild, moderate, severe)   -  MILD (1-3): doesn't interfere with normal activities    -  MODERATE (4-7): interferes with normal activities (e.g., work or school) or awakens from sleep, limping    -  SEVERE (8-10): excruciating pain, unable to do any normal activities, unable to walk     Severe difficulty walking needs help to dress and walk 4. ONSET: "When did the pain start?" "Does it come and go, or is it there all the time?"     constant 5. WORK OR EXERCISE: "Has there been any recent work or exercise that involved this part of the body?"      Na  6. CAUSE: "What do you think is causing the hip pain?"      Not sue  7. AGGRAVATING FACTORS: "What makes the hip pain worse?" (e.g., walking, climbing stairs, running)     Any movement  8. OTHER SYMPTOMS: "Do you have any other symptoms?" (e.g., back pain, pain shooting down leg,  fever, rash)     Hip pain right leg, radiates to front of hip. Previous medications tried  not helping with pain or movement  Protocols used: Hip Pain-A-AH

## 2023-03-23 ENCOUNTER — Encounter: Payer: Self-pay | Admitting: Internal Medicine

## 2023-03-23 ENCOUNTER — Other Ambulatory Visit: Payer: Self-pay | Admitting: Internal Medicine

## 2023-03-23 DIAGNOSIS — G43009 Migraine without aura, not intractable, without status migrainosus: Secondary | ICD-10-CM

## 2023-03-23 MED ORDER — TRAMADOL HCL 50 MG PO TABS: 50.0000 mg | ORAL_TABLET | Freq: Every day | ORAL | 0 refills | Status: DC | PRN

## 2023-04-06 ENCOUNTER — Other Ambulatory Visit: Payer: Self-pay | Admitting: Internal Medicine

## 2023-04-06 DIAGNOSIS — G43009 Migraine without aura, not intractable, without status migrainosus: Secondary | ICD-10-CM

## 2023-04-07 ENCOUNTER — Other Ambulatory Visit: Payer: Self-pay | Admitting: Internal Medicine

## 2023-04-07 ENCOUNTER — Encounter: Payer: Self-pay | Admitting: Internal Medicine

## 2023-04-07 DIAGNOSIS — G43009 Migraine without aura, not intractable, without status migrainosus: Secondary | ICD-10-CM

## 2023-04-07 MED ORDER — TRAMADOL HCL 50 MG PO TABS: 50.0000 mg | ORAL_TABLET | Freq: Every day | ORAL | 2 refills | Status: DC | PRN

## 2023-04-22 ENCOUNTER — Ambulatory Visit: Payer: Medicaid Other | Admitting: Internal Medicine

## 2023-04-28 ENCOUNTER — Encounter: Payer: Self-pay | Admitting: Internal Medicine

## 2023-04-28 NOTE — Telephone Encounter (Signed)
Please review.  KP

## 2023-05-01 ENCOUNTER — Other Ambulatory Visit: Payer: Self-pay | Admitting: Internal Medicine

## 2023-05-01 DIAGNOSIS — G43009 Migraine without aura, not intractable, without status migrainosus: Secondary | ICD-10-CM

## 2023-05-08 ENCOUNTER — Other Ambulatory Visit
Admission: RE | Admit: 2023-05-08 | Discharge: 2023-05-08 | Disposition: A | Discharge status: Home / Self Care | Payer: Managed Care, Other (non HMO) | Attending: Internal Medicine | Admitting: Internal Medicine

## 2023-05-08 ENCOUNTER — Encounter: Payer: Self-pay | Admitting: Internal Medicine

## 2023-05-08 ENCOUNTER — Ambulatory Visit (INDEPENDENT_AMBULATORY_CARE_PROVIDER_SITE_OTHER): Payer: Managed Care, Other (non HMO) | Admitting: Internal Medicine

## 2023-05-08 VITALS — BP 164/84 | HR 97 | Ht <= 58 in | Wt 178.0 lb

## 2023-05-08 DIAGNOSIS — Z0000 Encounter for general adult medical examination without abnormal findings: Secondary | ICD-10-CM | POA: Diagnosis not present

## 2023-05-08 DIAGNOSIS — R7303 Prediabetes: Secondary | ICD-10-CM | POA: Diagnosis not present

## 2023-05-08 DIAGNOSIS — Z13220 Encounter for screening for lipoid disorders: Secondary | ICD-10-CM | POA: Diagnosis not present

## 2023-05-08 DIAGNOSIS — L719 Rosacea, unspecified: Secondary | ICD-10-CM

## 2023-05-08 DIAGNOSIS — I10 Essential (primary) hypertension: Secondary | ICD-10-CM

## 2023-05-08 DIAGNOSIS — Z1159 Encounter for screening for other viral diseases: Secondary | ICD-10-CM | POA: Insufficient documentation

## 2023-05-08 DIAGNOSIS — G43009 Migraine without aura, not intractable, without status migrainosus: Secondary | ICD-10-CM | POA: Diagnosis not present

## 2023-05-08 DIAGNOSIS — Z1231 Encounter for screening mammogram for malignant neoplasm of breast: Secondary | ICD-10-CM | POA: Diagnosis not present

## 2023-05-08 LAB — CBC WITH DIFFERENTIAL/PLATELET
Abs Immature Granulocytes: 0.05 10*3/uL (ref 0.00–0.07)
Basophils Absolute: 0.1 10*3/uL (ref 0.0–0.1)
Basophils Relative: 1 %
Eosinophils Absolute: 0.2 10*3/uL (ref 0.0–0.5)
Eosinophils Relative: 2 %
HCT: 40.1 % (ref 36.0–46.0)
Hemoglobin: 13.9 g/dL (ref 12.0–15.0)
Immature Granulocytes: 0 %
Lymphocytes Relative: 19 %
Lymphs Abs: 2.3 10*3/uL (ref 0.7–4.0)
MCH: 29.1 pg (ref 26.0–34.0)
MCHC: 34.7 g/dL (ref 30.0–36.0)
MCV: 83.9 fL (ref 80.0–100.0)
Monocytes Absolute: 0.9 10*3/uL (ref 0.1–1.0)
Monocytes Relative: 8 %
Neutro Abs: 8.6 10*3/uL — ABNORMAL HIGH (ref 1.7–7.7)
Neutrophils Relative %: 70 %
Platelets: 240 10*3/uL (ref 150–400)
RBC: 4.78 MIL/uL (ref 3.87–5.11)
RDW: 14.1 % (ref 11.5–15.5)
Smear Review: NORMAL
WBC: 12.1 10*3/uL — ABNORMAL HIGH (ref 4.0–10.5)
nRBC: 0 % (ref 0.0–0.2)

## 2023-05-08 LAB — LIPID PANEL
Cholesterol: 143 mg/dL (ref 0–200)
HDL: 40 mg/dL — ABNORMAL LOW (ref >40)
LDL Cholesterol: 93 mg/dL (ref 0–99)
Total CHOL/HDL Ratio: 3.6 RATIO
Triglycerides: 52 mg/dL (ref <150)
VLDL: 10 mg/dL (ref 0–40)

## 2023-05-08 LAB — COMPREHENSIVE METABOLIC PANEL
ALT: 19 U/L (ref 0–44)
AST: 20 U/L (ref 15–41)
Albumin: 3.7 g/dL (ref 3.5–5.0)
Alkaline Phosphatase: 88 U/L (ref 38–126)
Anion gap: 4 — ABNORMAL LOW (ref 5–15)
BUN: 11 mg/dL (ref 6–20)
CO2: 26 mmol/L (ref 22–32)
Calcium: 8.4 mg/dL — ABNORMAL LOW (ref 8.9–10.3)
Chloride: 105 mmol/L (ref 98–111)
Creatinine, Ser: 0.77 mg/dL (ref 0.44–1.00)
GFR, Estimated: >60 mL/min (ref >60)
Glucose, Bld: 98 mg/dL (ref 70–99)
Potassium: 3.8 mmol/L (ref 3.5–5.1)
Sodium: 135 mmol/L (ref 135–145)
Total Bilirubin: <0.1 mg/dL — ABNORMAL LOW (ref 0.3–1.2)
Total Protein: 6.8 g/dL (ref 6.5–8.1)

## 2023-05-08 LAB — TSH: TSH: 0.498 u[IU]/mL (ref 0.350–4.500)

## 2023-05-08 LAB — HEPATITIS C ANTIBODY: HCV Ab: NONREACTIVE

## 2023-05-08 MED ORDER — CLINDAMYCIN PHOSPHATE 1 % EX SOLN: Freq: Two times a day (BID) | CUTANEOUS | 0 refills | Status: DC

## 2023-05-08 MED ORDER — NURTEC 75 MG PO TBDP: 1.0000 | ORAL_TABLET | ORAL | 0 refills | Status: DC

## 2023-05-08 NOTE — Progress Notes (Signed)
Date:  05/08/2023   Name:  Amy Gomez   DOB:  08-31-1981   MRN:  [id number]   Chief Complaint: Annual Exam, Migraine, and Hypertension Juletta Darrianna Blonder is a 42 y.o. female who presents today for her Complete Annual Exam. She feels well. She reports exercising some. She reports she is sleeping poorly. Breast complaints - none.  Mammogram: due DEXA: none Pap smear: 05/2021 neg/neg Colonoscopy: none  Health Maintenance Due  Topic Date Due   Hepatitis C Screening  Never done   Pneumococcal Vaccine 58-65 Years old (2 of 2 - PCV) 06/15/2019   COVID-19 Vaccine (3 - Pfizer risk series) 07/19/2020    Immunization History  Administered Date(s) Administered   Influenza, Seasonal, Injecte, Preservative Fre 11/12/2012   Influenza,inj,Quad PF,6+ Mos 08/01/2013, 08/03/2014, 08/27/2015, 09/04/2016, 08/11/2018, 08/06/2019, 08/19/2021, 07/18/2022   PFIZER(Purple Top)SARS-COV-2 Vaccination 05/31/2020, 06/21/2020   Pneumococcal Polysaccharide-23 06/14/2018   Tdap 01/16/2023    Hypertension This is a chronic problem. The problem is controlled. Associated symptoms include headaches. Pertinent negatives include no chest pain, palpitations or shortness of breath.  Migraine  This is a recurrent problem. Episode frequency: several days per week. The pain quality is similar to prior headaches. Associated symptoms include back pain. Pertinent negatives include no abdominal pain, coughing, dizziness, fever, hearing loss, tinnitus or vomiting. Her past medical history is significant for hypertension.  Back Pain This is a new problem. The problem has been gradually improving since onset. The pain is present in the lumbar spine. Associated symptoms include headaches. Pertinent negatives include no abdominal pain, chest pain, dysuria or fever.    Lab Results  Component Value Date   NA 136 10/08/2021   K 3.8 10/08/2021   CO2 24 10/08/2021   GLUCOSE 93 10/08/2021   BUN 10 10/08/2021   CREATININE  0.61 10/08/2021   CALCIUM 9.5 10/08/2021   GFRNONAA >60 10/08/2021   No results found for: "CHOL", "HDL", "LDLCALC", "LDLDIRECT", "TRIG", "CHOLHDL" Lab Results  Component Value Date   TSH 0.954 04/24/2021   Lab Results  Component Value Date   HGBA1C 5.9 (H) 10/08/2021   Lab Results  Component Value Date   WBC 13.2 (H) 12/17/2020   HGB 13.5 12/17/2020   HCT 40.1 12/17/2020   MCV 83.9 12/17/2020   PLT 277 12/17/2020   Lab Results  Component Value Date   ALT 17 12/17/2020   AST 17 12/17/2020   ALKPHOS 69 12/17/2020   BILITOT 0.6 12/17/2020   No results found for: "25OHVITD2", "25OHVITD3", "VD25OH"   Review of Systems  Constitutional:  Negative for chills, fatigue and fever.  HENT:  Negative for congestion, hearing loss, tinnitus, trouble swallowing and voice change.   Eyes:  Negative for visual disturbance.  Respiratory:  Negative for cough, chest tightness, shortness of breath and wheezing.   Cardiovascular:  Negative for chest pain, palpitations and leg swelling.  Gastrointestinal:  Negative for abdominal pain, constipation, diarrhea and vomiting.  Endocrine: Negative for polydipsia and polyuria.  Genitourinary:  Negative for dysuria, frequency, genital sores, vaginal bleeding and vaginal discharge.  Musculoskeletal:  Positive for back pain. Negative for arthralgias, gait problem and joint swelling.  Skin:  Negative for color change and rash.       Acne rosacea   Neurological:  Positive for headaches. Negative for dizziness, tremors and light-headedness.  Hematological:  Negative for adenopathy. Does not bruise/bleed easily.  Psychiatric/Behavioral:  Negative for dysphoric mood and sleep disturbance. The patient is nervous/anxious.  Patient Active Problem List   Diagnosis Date Noted   Excess body and facial hair 01/16/2023   Severe episode of recurrent major depressive disorder, without psychotic features (HCC) 04/11/2022   Recurrent periodic urticaria 12/09/2021    Left cervical radiculopathy 02/04/2021   Supraspinatus tendinitis, left 02/04/2021   Cervical paraspinal muscle spasm 02/04/2021   Rosacea 01/09/2020   Panic disorder with agoraphobia 07/11/2019   Pre-diabetes 01/26/2019   Liver mass 01/25/2019   Essential hypertension 07/15/2017   Gastroesophageal reflux disease without esophagitis 12/18/2016   History of kidney stones 06/04/2015   Dysmenorrhea 06/04/2015   Migraine without aura and without status migrainosus, not intractable 06/04/2015    Allergies  Allergen Reactions   Sulfa Antibiotics Anaphylaxis, Swelling, Rash and Other (See Comments)   Azithromycin Rash    Rash as a teenager, "Z Pack"    Past Surgical History:  Procedure Laterality Date   DILATION AND CURETTAGE OF UTERUS     TUBAL LIGATION Bilateral 2007    Social History   Tobacco Use   Smoking status: Every Day    Packs/day: 0.50    Years: 24.00    Additional pack years: 0.00    Total pack years: 12.00    Types: Cigarettes    Start date: 1996   Smokeless tobacco: Never  Vaping Use   Vaping Use: Former  Substance Use Topics   Alcohol use: No    Alcohol/week: 0.0 standard drinks of alcohol   Drug use: No     Medication list has been reviewed and updated.  Current Meds  Medication Sig   busPIRone (BUSPAR) 10 MG tablet Take 1 tablet (10 mg total) by mouth 3 (three) times daily.   clindamycin (CLEOCIN T) 1 % external solution Apply topically 2 (two) times daily.   naproxen (NAPROSYN) 500 MG tablet Take 1 tablet (500 mg total) by mouth 2 (two) times daily with a meal.   Rimegepant Sulfate ([name]) 75 MG TBDP Take 1 tablet (75 mg total) by mouth every other day.   traMADol (ULTRAM) 50 MG tablet TAKE 1 TABLET BY MOUTH ONCE DAILY AS NEEDED FOR MIGRAINE HEADACHE   [DISCONTINUED] cyclobenzaprine (FLEXERIL) 10 MG tablet Take 1 tablet (10 mg total) by mouth 2 (two) times daily as needed for muscle spasms.       05/08/2023    8:19 AM 01/16/2023    2:03 PM  07/18/2022    9:38 AM 04/11/2022   10:36 AM  GAD 7 : Generalized Anxiety Score  Nervous, Anxious, on Edge 3 3 3 1   Control/stop worrying 3 3 3 2   Worry too much - different things 2 3 3 2   Trouble relaxing 3 3 3 2   Restless 3 3 3 1   Easily annoyed or irritable 3 3 3 2   Afraid - awful might happen 3 3 3 1   Total GAD 7 Score 20 21 21 11   Anxiety Difficulty Very difficult Extremely difficult Extremely difficult Somewhat difficult       05/08/2023    8:19 AM 01/16/2023    2:02 PM 07/18/2022    9:37 AM  Depression screen PHQ 2/9  Decreased Interest 2 0 2  Down, Depressed, Hopeless 1 0 1  PHQ - 2 Score 3 0 3  Altered sleeping 3 0 3  Tired, decreased energy 2 0 3  Change in appetite 3 0 0  Feeling bad or failure about yourself  3 0 2  Trouble concentrating 2 0 2  Moving slowly or fidgety/restless 2  0 2  Suicidal thoughts 0 0 0  PHQ-9 Score 18 0 15  Difficult doing work/chores Very difficult Not difficult at all Extremely dIfficult    BP Readings from Last 3 Encounters:  05/08/23 (!) 164/84  02/17/23 128/78  02/15/23 (!) 146/87    Physical Exam Vitals and nursing note reviewed.  Constitutional:      General: She is not in acute distress.    Appearance: She is well-developed.  HENT:     Head: Normocephalic and atraumatic.     Right Ear: Tympanic membrane and ear canal normal.     Left Ear: Tympanic membrane and ear canal normal.     Nose:     Right Sinus: No maxillary sinus tenderness.     Left Sinus: No maxillary sinus tenderness.  Eyes:     General: No scleral icterus.       Right eye: No discharge.        Left eye: No discharge.     Conjunctiva/sclera: Conjunctivae normal.  Neck:     Thyroid: No thyromegaly.     Vascular: No carotid bruit.  Cardiovascular:     Rate and Rhythm: Normal rate and regular rhythm.     Pulses: Normal pulses.     Heart sounds: Normal heart sounds.  Pulmonary:     Effort: Pulmonary effort is normal. No respiratory distress.     Breath  sounds: No wheezing.  Chest:  Breasts:    Right: No mass, nipple discharge, skin change or tenderness.     Left: No mass, nipple discharge, skin change or tenderness.  Abdominal:     General: Bowel sounds are normal.     Palpations: Abdomen is soft.     Tenderness: There is no abdominal tenderness.  Musculoskeletal:        General: Normal range of motion.     Cervical back: Normal range of motion. No erythema.     Right lower leg: No edema.     Left lower leg: No edema.  Lymphadenopathy:     Cervical: No cervical adenopathy.  Skin:    General: Skin is warm and dry.     Capillary Refill: Capillary refill takes less than 2 seconds.     Findings: No rash.  Neurological:     General: No focal deficit present.     Mental Status: She is alert and oriented to person, place, and time.     Cranial Nerves: No cranial nerve deficit.     Sensory: No sensory deficit.     Deep Tendon Reflexes: Reflexes are normal and symmetric.  Psychiatric:        Attention and Perception: Attention normal.        Mood and Affect: Mood normal.     Wt Readings from Last 3 Encounters:  05/08/23 178 lb (80.7 kg)  02/17/23 180 lb (81.6 kg)  02/15/23 187 lb (84.8 kg)    BP (!) 164/84 (BP Location: Left Arm, Cuff Size: Large)   Pulse 97   Ht 4\' 10"  (1.473 m)   Wt 178 lb (80.7 kg)   SpO2 97%   BMI 37.20 kg/m   Assessment and Plan:  Problem List Items Addressed This Visit     Rosacea   Relevant Medications   clindamycin (CLEOCIN T) 1 % external solution   Pre-diabetes    Managed with diet alone Lab Results  Component Value Date   HGBA1C 5.9 (H) 10/08/2021        Relevant Orders  Hemoglobin A1c   Migraine without aura and without status migrainosus, not intractable (Chronic)    No recent change in the character or frequency of headaches. Headaches respond well to current therapy with Tramadol. Recommend trial of [name] Will continue current plan and follow up if worsening.        Relevant Medications   Rimegepant Sulfate ([name]) 75 MG TBDP   Essential hypertension (Chronic)    Stable exam with uncontrolled BP.  Currently not taking lisinopril. She will resume daily dosing and follow up in 6 weeks        Relevant Orders   CBC with Differential/Platelet   Comprehensive metabolic panel   TSH   Other Visit Diagnoses     Annual physical exam    -  Primary   Relevant Orders   CBC with Differential/Platelet   Comprehensive metabolic panel   Hepatitis C antibody   Lipid panel   TSH   Hemoglobin A1c   Encounter for screening mammogram for breast cancer       Relevant Orders   MM 3D SCREENING MAMMOGRAM BILATERAL BREAST   Need for hepatitis C screening test       Relevant Orders   Hepatitis C antibody   Screening for lipid disorders       Relevant Orders   Lipid panel       Return in about 6 weeks (around 06/19/2023) for HTN, migraine.   Partially dictated using Dragon software, any errors are not intentional.  Reubin Milan, MD [facility] [place], [place]

## 2023-05-08 NOTE — Patient Instructions (Signed)
Call [facility] to schedule your mammogram at [phone].  

## 2023-05-08 NOTE — Assessment & Plan Note (Addendum)
Stable exam with uncontrolled BP.  Currently not taking lisinopril. She will resume daily dosing and follow up in 6 weeks

## 2023-05-08 NOTE — Assessment & Plan Note (Signed)
Managed with diet alone Lab Results  Component Value Date   HGBA1C 5.9 (H) 10/08/2021

## 2023-05-08 NOTE — Assessment & Plan Note (Signed)
No recent change in the character or frequency of headaches. Headaches respond well to current therapy with Tramadol. Recommend trial of [name] Will continue current plan and follow up if worsening.

## 2023-05-09 LAB — HEMOGLOBIN A1C
Hgb A1c MFr Bld: 5.9 % — ABNORMAL HIGH (ref 4.8–5.6)
Mean Plasma Glucose: 123 mg/dL

## 2023-05-12 ENCOUNTER — Ambulatory Visit: Payer: Medicaid Other | Admitting: Internal Medicine

## 2023-05-22 ENCOUNTER — Encounter: Payer: Self-pay | Admitting: Internal Medicine

## 2023-05-24 ENCOUNTER — Ambulatory Visit: Payer: Self-pay

## 2023-05-24 ENCOUNTER — Encounter: Payer: Self-pay | Admitting: Internal Medicine

## 2023-05-25 ENCOUNTER — Ambulatory Visit
Admission: EM | Admit: 2023-05-25 | Discharge: 2023-05-25 | Disposition: A | Discharge status: Home / Self Care | Payer: Managed Care, Other (non HMO) | Attending: Urgent Care | Admitting: Urgent Care

## 2023-05-25 ENCOUNTER — Other Ambulatory Visit: Payer: Self-pay

## 2023-05-25 ENCOUNTER — Encounter: Payer: Self-pay | Admitting: Emergency Medicine

## 2023-05-25 DIAGNOSIS — K649 Unspecified hemorrhoids: Secondary | ICD-10-CM

## 2023-05-25 DIAGNOSIS — I10 Essential (primary) hypertension: Secondary | ICD-10-CM

## 2023-05-25 MED ORDER — HYDROCORTISONE (PERIANAL) 2.5 % EX CREA: 1.0000 | TOPICAL_CREAM | Freq: Two times a day (BID) | CUTANEOUS | 0 refills | Status: DC

## 2023-05-25 MED ORDER — LIDOCAINE-HYDROCORTISONE ACE 3-2.5 % RE KIT: PACK | RECTAL | 0 refills | Status: DC

## 2023-05-25 MED ORDER — LISINOPRIL 30 MG PO TABS
30.0000 mg | ORAL_TABLET | Freq: Every day | ORAL | 1 refills | Status: DC
Start: 2023-05-25 — End: 2024-01-18

## 2023-05-25 NOTE — ED Triage Notes (Signed)
Pt presents with an external Hemorrhoid that started hurting yesterday.

## 2023-05-25 NOTE — Telephone Encounter (Signed)
Please review.  KP

## 2023-05-25 NOTE — ED Provider Notes (Signed)
[facility]    CSN: [id number] Arrival date & time: 05/25/23  0807      History   Chief Complaint Chief Complaint  Patient presents with   Hemorrhoids    HPI Amy Gomez is a 42 y.o. female.   HPI  Presents with external hemorrhoid with pain starting yesterday.  States she has had previous episode of similar swelling and needed to have a clot removed in urgent care.  She states it started to swell up yesterday.  Endorses very painful.  Review of the patient's chart failed to locate a recent episode of hemorrhoids though there is a historical entry from 2016.  Past Medical History:  Diagnosis Date   Hypertension    Kidney stones    Leukocytosis 08/16/2018   Migraine     Patient Active Problem List   Diagnosis Date Noted   Excess body and facial hair 01/16/2023   Severe episode of recurrent major depressive disorder, without psychotic features (HCC) 04/11/2022   Recurrent periodic urticaria 12/09/2021   Left cervical radiculopathy 02/04/2021   Supraspinatus tendinitis, left 02/04/2021   Cervical paraspinal muscle spasm 02/04/2021   Rosacea 01/09/2020   Panic disorder with agoraphobia 07/11/2019   Pre-diabetes 01/26/2019   Liver mass 01/25/2019   Essential hypertension 07/15/2017   Gastroesophageal reflux disease without esophagitis 12/18/2016   History of kidney stones 06/04/2015   Dysmenorrhea 06/04/2015   Migraine without aura and without status migrainosus, not intractable 06/04/2015    Past Surgical History:  Procedure Laterality Date   DILATION AND CURETTAGE OF UTERUS     TUBAL LIGATION Bilateral 2007    OB History     Gravida  4   Para  3   Term  2   Preterm  1   AB  1   Living  3      SAB  1   IAB      Ectopic      Multiple      Live Births  3            Home Medications    Prior to Admission medications   Medication Sig Start Date End Date Taking? Authorizing Provider  busPIRone (BUSPAR) 10 MG  tablet Take 1 tablet (10 mg total) by mouth 3 (three) times daily. 01/16/23  Yes Reubin Milan, MD  naproxen (NAPROSYN) 500 MG tablet Take 1 tablet (500 mg total) by mouth 2 (two) times daily with a meal. 02/15/23  Yes Brimage, Vondra, DO  Rimegepant Sulfate ([name]) 75 MG TBDP Take 1 tablet (75 mg total) by mouth every other day. 05/08/23  Yes Reubin Milan, MD  traMADol (ULTRAM) 50 MG tablet TAKE 1 TABLET BY MOUTH ONCE DAILY AS NEEDED FOR MIGRAINE HEADACHE 05/01/23  Yes Reubin Milan, MD  clindamycin (CLEOCIN T) 1 % external solution Apply topically 2 (two) times daily. 05/08/23   Reubin Milan, MD  Eflornithine HCl 13.9 % cream Apply 1 Application topically in the morning and at bedtime. Patient not taking: Reported on 05/08/2023 01/16/23   Reubin Milan, MD  lisinopril (ZESTRIL) 30 MG tablet Take 1 tablet (30 mg total) by mouth daily. 05/25/23   Reubin Milan, MD  diphenhydrAMINE HCl (BENADRYL PO) Take by mouth as needed.  09/03/20  [provider]  omeprazole (PRILOSEC) 10 MG capsule Take 1 capsule (10 mg total) by mouth daily. Patient not taking: Reported on 09/26/2019 09/24/19 12/06/19  Cuthriell, Delorise Royals, PA-C    Family  History Family History  Problem Relation Age of Onset   Other Mother        unknown medical history   Other Father        unknown medical hisotry   Endometriosis Maternal Grandmother     Social History Social History   Tobacco Use   Smoking status: Every Day    Current packs/day: 0.50    Average packs/day: 0.5 packs/day for 28.5 years (14.3 ttl pk-yrs)    Types: Cigarettes    Start date: 1996   Smokeless tobacco: Never  Vaping Use   Vaping status: Former  Substance Use Topics   Alcohol use: No    Alcohol/week: 0.0 standard drinks of alcohol   Drug use: No     Allergies   Sulfa antibiotics and Azithromycin   Review of Systems Review of Systems   Physical Exam Triage Vital Signs ED Triage Vitals  Encounter Vitals Group      BP 05/25/23 0833 (!) 145/91     Systolic BP Percentile --      Diastolic BP Percentile --      Pulse Rate 05/25/23 0833 98     Resp 05/25/23 0833 18     Temp 05/25/23 0833 98.6 F (37 C)     Temp Source 05/25/23 0833 Oral     SpO2 05/25/23 0833 96 %     Weight --      Height --      Head Circumference --      Peak Flow --      Pain Score 05/25/23 0832 8     Pain Loc --      Pain Education --      Exclude from Growth Chart --    No data found.  Updated Vital Signs BP (!) 145/91 (BP Location: Right Arm)   Pulse 98   Temp 98.6 F (37 C) (Oral)   Resp 18   LMP 05/11/2023 (Approximate)   SpO2 96%   Visual Acuity Right Eye Distance:   Left Eye Distance:   Bilateral Distance:    Right Eye Near:   Left Eye Near:    Bilateral Near:     Physical Exam Vitals reviewed. Exam conducted with a chaperone present.  Constitutional:      Appearance: Normal appearance.  Genitourinary:   Skin:    General: Skin is dry.  Neurological:     General: No focal deficit present.     Mental Status: She is alert and oriented to person, place, and time.  Psychiatric:        Mood and Affect: Mood normal.        Behavior: Behavior normal.      UC Treatments / Results  Labs (all labs ordered are listed, but only abnormal results are displayed) Labs Reviewed - No data to display  EKG   Radiology No results found.  Procedures Procedures (including critical care time)  Medications Ordered in UC Medications - No data to display  Initial Impression / Assessment and Plan / UC Course  I have reviewed the triage vital signs and the nursing notes.  Pertinent labs & imaging results that were available during my care of the patient were reviewed by me and considered in my medical decision making (see chart for details).   Hemorrhoid is present.  No evidence of thrombosis.  Unable to determine if internal versus external d/t exquisite tenderness.  If internal, then grade  3.  Attempted to treat with lidocaine-hydrocortisone.  If unable to find a compounding pharmacy, then will use straight cortisone cream.  Will provide referral to general surgeon in the event this treatment is not resolved the hemorrhoid or if it recurs.  Counseled patient on potential for adverse effects with medications prescribed/recommended today, ER and return-to-clinic precautions discussed, patient verbalized understanding and agreement with care plan.  Final Clinical Impressions(s) / UC Diagnoses   Final diagnoses:  None   Discharge Instructions   None    ED Prescriptions   None    PDMP not reviewed this encounter.   Charma Igo, [place] 05/25/23 [phone]

## 2023-05-25 NOTE — Discharge Instructions (Addendum)
I have prescribed 2 medications for you.  You should use either 1 or the other.  Not both.  I attempted to prescribe a combination of a pain relieving medication called lidocaine with an anti-inflammatory medication called hydrocortisone.  This medication requires manual mixing and not all pharmacies are able to perform this service.  I sent it to a pharmacy in [place] that may be able to provide the medication.  In the event they are not able to provide the medication, I also sent just the anti-inflammatory medication to your preferred pharmacy.  This medicine should be used no longer than 7 days.  If your symptoms are not resolved after 7 days, or if it recurs, you need to see a general surgeon or a GI doctor to have the hemorrhoid evaluated and treated.

## 2023-05-26 ENCOUNTER — Ambulatory Visit (INDEPENDENT_AMBULATORY_CARE_PROVIDER_SITE_OTHER): Payer: Managed Care, Other (non HMO) | Admitting: Surgery

## 2023-05-26 ENCOUNTER — Encounter: Payer: Self-pay | Admitting: Surgery

## 2023-05-26 VITALS — BP 144/94 | HR 116 | Temp 98.5°F | Ht 60.0 in | Wt 174.2 lb

## 2023-05-26 DIAGNOSIS — K645 Perianal venous thrombosis: Secondary | ICD-10-CM | POA: Insufficient documentation

## 2023-05-26 MED ORDER — DIBUCAINE (PERIANAL) 1 % EX OINT: 1.0000 | TOPICAL_OINTMENT | CUTANEOUS | 0 refills | Status: DC | PRN

## 2023-05-26 MED ORDER — HYDROCODONE-ACETAMINOPHEN 5-325 MG PO TABS: 2.0000 | ORAL_TABLET | Freq: Four times a day (QID) | ORAL | 0 refills | Status: DC | PRN

## 2023-05-26 NOTE — Progress Notes (Signed)
Patient ID: Amy Gomez, female   DOB: 08-03-1981, 42 y.o.   MRN: [id number]  Chief Complaint: Anal pain  History of Present Illness Amy Gomez is a 42 y.o. female with a prior history of hemorrhoids dating back to birth of her child, 17 years ago.  Most recent episode recurred about a week ago after removing a dressing.  She reports "lightening crotch" pain.  She seemed to get along with it but it was worse yesterday when she presented to the urgent care.  She identified a more pronounced hemorrhoidal protrusion.  She knows she has a prior history of thrombosis.  She denies any blood in the stools.  She reports she may have had some constipation following recent use of narcotic pain medication.  But otherwise moves her bowels daily, occasionally twice daily.  She currently reports pain, burning and itching.  She has not utilize sitz bath's, she has been prescribed topicals but has not obtained them.  She has had no prior colonoscopies.  Past Medical History Past Medical History:  Diagnosis Date   Hypertension    Kidney stones    Leukocytosis 08/16/2018   Migraine       Past Surgical History:  Procedure Laterality Date   DILATION AND CURETTAGE OF UTERUS     TUBAL LIGATION Bilateral 2007    Allergies  Allergen Reactions   Sulfa Antibiotics Anaphylaxis, Swelling, Rash and Other (See Comments)   Azithromycin Rash    Rash as a teenager, "Z Pack"    Current Outpatient Medications  Medication Sig Dispense Refill   busPIRone (BUSPAR) 10 MG tablet Take 1 tablet (10 mg total) by mouth 3 (three) times daily. 90 tablet 0   clindamycin (CLEOCIN T) 1 % external solution Apply topically 2 (two) times daily. 30 mL 0   dibucaine (NUPERCAINAL) 1 % OINT Place 1 Application rectally as needed for hemorrhoids. 28 g 0   Eflornithine HCl 13.9 % cream Apply 1 Application topically in the morning and at bedtime. 45 g 1   hydrocortisone (ANUSOL-HC) 2.5 % rectal cream Place 1 Application  rectally 2 (two) times daily. Do not use longer than 7 days. 30 g 0   Lidocaine-Hydrocortisone Ace 3-2.5 % KIT Apply a pea sized amount of ointment to your hemorrhoid and rectum twice daily. Do not use longer than 7 days. 1 kit 0   lisinopril (ZESTRIL) 30 MG tablet Take 1 tablet (30 mg total) by mouth daily. 90 tablet 1   naproxen (NAPROSYN) 500 MG tablet Take 1 tablet (500 mg total) by mouth 2 (two) times daily with a meal. 30 tablet 0   Rimegepant Sulfate ([name]) 75 MG TBDP Take 1 tablet (75 mg total) by mouth every other day. 16 tablet 0   traMADol (ULTRAM) 50 MG tablet TAKE 1 TABLET BY MOUTH ONCE DAILY AS NEEDED FOR MIGRAINE HEADACHE 5 tablet 0   No current facility-administered medications for this visit.    Family History Family History  Problem Relation Age of Onset   Other Mother        unknown medical history   Other Father        unknown medical hisotry   Endometriosis Maternal Grandmother       Social History Social History   Tobacco Use   Smoking status: Every Day    Current packs/day: 0.50    Average packs/day: 0.5 packs/day for 28.5 years (14.3 ttl pk-yrs)    Types: Cigarettes    Start date: 56  Smokeless tobacco: Never  Vaping Use   Vaping status: Former  Substance Use Topics   Alcohol use: No    Alcohol/week: 0.0 standard drinks of alcohol   Drug use: No        Review of Systems  Constitutional: Negative.   HENT: Negative.    Eyes: Negative.   Respiratory: Negative.    Cardiovascular: Negative.   Gastrointestinal: Negative.   Genitourinary: Negative.   Skin: Negative.   Neurological: Negative.   Psychiatric/Behavioral: Negative.       Physical Exam Blood pressure (!) 144/94, pulse (!) 116, temperature 98.5 F (36.9 C), temperature source Oral, height 5' (1.524 m), weight 174 lb 3.2 oz (79 kg), last menstrual period 05/11/2023, SpO2 97%. Last Weight  Most recent update: 05/26/2023  9:46 AM    Weight  79 kg (174 lb 3.2 oz)              CONSTITUTIONAL: Well developed, and nourished, appropriately responsive and aware without distress.   EYES: Sclera non-icteric.   EARS, NOSE, MOUTH AND THROAT:  The oropharynx is clear. Oral mucosa is pink and moist.     Hearing is intact to voice.  NECK: Trachea is midline, and there is no jugular venous distension.  LYMPH NODES:  Lymph nodes in the neck are not appreciated. RESPIRATORY:  Normal respiratory effort without pathologic use of accessory muscles. CARDIOVASCULAR:  Well perfused.  GI: The abdomen is  soft, nontender, and nondistended. There were no palpable masses.  GU: [name] present as chaperone.  She has a rather prominent external hemorrhoid fairly recently thrombosed and tender anteriorly.  She tolerated some degree of digital exam proximal to this point, but not well.  A lot of anal spasm and resistance noted. MUSCULOSKELETAL:  Symmetrical muscle tone appreciated in all four extremities.    SKIN: Skin turgor is normal. No pathologic skin lesions appreciated.  NEUROLOGIC:  Motor and sensation appear grossly normal.  Cranial nerves are grossly without defect. PSYCH:  Alert and oriented to person, place and time. Affect is appropriate for situation.  Data Reviewed I have personally reviewed what is currently available of the patient's imaging, recent labs and medical records.   Labs:     Latest Ref Rng & Units 05/08/2023    9:06 AM 12/17/2020   12:14 PM 10/29/2020    4:18 PM  CBC  WBC 4.0 - 10.5 K/uL 12.1  13.2  16.4   Hemoglobin 12.0 - 15.0 g/dL 29.5  62.1  30.8   Hematocrit 36.0 - 46.0 % 40.1  40.1  39.7   Platelets 150 - 400 K/uL 240  277  244       Latest Ref Rng & Units 05/08/2023    9:06 AM 10/08/2021    2:06 PM 12/17/2020   12:14 PM  CMP  Glucose 70 - 99 mg/dL 98  93  657   BUN 6 - 20 mg/dL 11  10  12    Creatinine 0.44 - 1.00 mg/dL 8.46  9.62  9.52   Sodium 135 - 145 mmol/L 135  136  137   Potassium 3.5 - 5.1 mmol/L 3.8  3.8  3.9   Chloride 98 - 111 mmol/L  105  102  105   CO2 22 - 32 mmol/L 26  24  21    Calcium 8.9 - 10.3 mg/dL 8.4  9.5  8.7   Total Protein 6.5 - 8.1 g/dL 6.8   6.7   Total Bilirubin 0.3 - 1.2 mg/dL <8.4  0.6   Alkaline Phos 38 - 126 U/L 88   69   AST 15 - 41 U/L 20   17   ALT 0 - 44 U/L 19   17       Imaging: Radiological images reviewed:   Within last 24 hrs: No results found.  Assessment    Anteriorly located thrombosed external hemorrhoid, etiology for her recurrent anal pain.  Anticipating prolonged driving a car and feels she is unable to tolerate it at this time. 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/11/2019   Pre-diabetes 01/26/2019   Liver mass 01/25/2019   Essential hypertension 07/15/2017   Gastroesophageal reflux disease without esophagitis 12/18/2016   History of kidney stones 06/04/2015   Dysmenorrhea 06/04/2015   Migraine without aura and without status migrainosus, not intractable 06/04/2015    Plan    Anterior thrombosed external hemorrhoid, external hemorrhoidectomy.  Pre-operative Diagnosis: As above  Post-operative Diagnosis: same.    Surgeon: Campbell Lerner, M.D., FACS  Anesthesia: Local   Findings: As per exam above.  Estimated Blood Loss: Less than 2 mL         Specimens: External thrombosed hemorrhoid discarded.          Complications: none              Procedure Details  The patient was evaluated, the benefits, complications, treatment options, and expected outcomes were discussed with the patient. The risks of bleeding, infection, recurrence  of symptoms, failure to resolve symptoms, unanticipated injury, any of which could require further surgery were reviewed with the patient. The likelihood of improving the patient's symptoms with return to their baseline status is expected.  The patient and/or family concurred with the proposed plan, giving informed consent.  The patient was taken to our procedure room, identified and the procedure verified.    The patient was positioned in the kneeling position and the perianal area was prepped with  Chloraprep and draped in the sterile fashion.  A Time Out was held and the above information confirmed.  Local infiltration with 1% lidocaine with epinephrine is applied to adequate anesthesia.  Hemostat was applied across the base of the hemorrhoid.  More than 3 minutes was allowed for clamp application.  The hemorrhoid was then excised off the clamp and the clamp released.  There is no evidence of bleeding.  The patient tolerated procedure well.  Topical anesthesia recommended, along with sitz bath's, fiber supplementation.  A prescription for Vicodin will be sent to her pharmacy.  She will follow-up in 1 month or as needed.  Advised to pursue a goal of 25 to 30 g of fiber daily.  Made aware that the majority of this may be through natural sources, but advised to be aware of actual consumption and to ensure minimal consumption by daily supplementation.  Various forms of supplements discussed.  Recommended Psyllium husk, that mixes well with applesauce, or the powder which goes down well shaken with chocolate milk.  Strongly advised to consume more fluids to ensure adequate hydration, instructed to watch color of urine to determine adequacy of hydration.  Clarity is  pursued in urine output, and bowel activity that correlates to significant meal intake.   We need to avoid deferring having bowel movements, advised to take the time at the first sign of sensation, typically following meals, and in the morning.    Subsequent utilization of MiraLAX may be needed ensure at least daily movement, ideally twice daily bowel movements.  If multiple doses of MiraLAX are necessary utilize them. Never skip a day...  To be regular, we must do the above EVERY day.    Face-to-face time spent with the patient and accompanying care providers(if present) was 40 minutes, with more than 50% of the time spent counseling, educating, and coordinating care of the patient.    These notes generated with voice recognition software. I apologize for typographical errors.  Campbell Lerner M.D., FACS 05/26/2023, 11:16 AM

## 2023-05-26 NOTE — Patient Instructions (Addendum)
Advised to pursue a goal of 25 to 30 g of fiber daily.  Made aware that the majority of this may be through natural sources, but advised to be aware of actual consumption and to ensure minimal consumption by daily supplementation.  Various forms of supplements discussed.  Recommended Psyllium husk, that mixes well with applesauce, or the powder which goes down well shaken with chocolate milk.  Strongly advised to consume more fluids to ensure adequate hydration, instructed to watch color of urine to determine adequacy of hydration.  Clarity is pursued in urine output, and bowel activity that correlates to significant meal intake.   We need to avoid deferring having bowel movements, advised to take the time at the first sign of sensation, typically following meals, and in the morning.   Subsequent utilization of MiraLAX may be needed ensure at least daily movement, ideally twice daily bowel movements.  If multiple doses of MiraLAX are necessary utilize them. Never skip a day...  To be regular, we must do the above EVERY day.    If you have any concerns or questions, please feel free to call our office.    Hemorrhoids Hemorrhoids are swollen veins that may form: In the butt (rectum). These are called internal hemorrhoids. Around the opening of the butt (anus). These are called external hemorrhoids. Most hemorrhoids do not cause very bad problems. They often get better with changes to your lifestyle and what you eat. What are the causes? Having trouble pooping (constipation) or watery poop (diarrhea). Pushing too hard when you poop. Pregnancy. Being very overweight (obese). Sitting for too long. Riding a bike for a long time. Heavy lifting or other things that take a lot of effort. Anal sex. What are the signs or symptoms? Pain. Itching or soreness in the butt. Bleeding from the butt. Leaking poop. Swelling. One or more lumps around the opening of your butt. How is this treated? In most  cases, hemorrhoids can be treated at home. You may be told to: Change what you eat. Make changes to your lifestyle. If these treatments do not help, you may need to have a procedure done. Your doctor may need to: Place rubber bands at the bottom of the hemorrhoids to make them fall off. Put medicine into the hemorrhoids to shrink them. Shine a type of light on the hemorrhoids to cause them to fall off. Do surgery to get rid of the hemorrhoids. Follow these instructions at home: Medicines Take over-the-counter and prescription medicines only as told by your doctor. Use creams with medicine in them or medicines that you put in your butt as told by your doctor. Eating and drinking  Eat foods that have a lot of fiber in them. These include whole grains, beans, nuts, fruits, and vegetables. Ask your doctor about taking products that have fiber added to them (fibersupplements). Take in less fat. You can do this by: Eating low-fat dairy products. Eating less red meat. Staying away from processed foods. Drink enough fluid to keep your pee (urine) pale yellow. Managing pain and swelling  Take a warm-water bath (sitz bath) for 20 minutes to ease pain. Do this 3-4 times a day. You may do this in a bathtub. You may also use a portable sitz bath that fits over the toilet. If told, put ice on the painful area. It may help to use ice between your warm baths. Put ice in a plastic bag. Place a towel between your skin and the bag. Leave the ice on  for 20 minutes, 2-3 times a day. If your skin turns bright red, take off the ice right away to prevent skin damage. The risk of damage is higher if you cannot feel pain, heat, or cold. General instructions Exercise. Ask your doctor how much and what kind of exercise is best for you. Go to the bathroom when you need to poop. Do not wait. Try not to push too hard when you poop. Keep your butt dry and clean. Use wet toilet paper or moist towelettes after you  poop. Do not sit on the toilet for a long time. Contact a doctor if: You have pain and swelling that do not get better with treatment. You have trouble pooping. You cannot poop. You have pain or swelling outside the area of the hemorrhoids. Get help right away if: You have bleeding from the butt that will not stop. This information is not intended to replace advice given to you by your health care provider. Make sure you discuss any questions you have with your health care provider. Document Revised: 07/09/2022 Document Reviewed: 07/09/2022 Elsevier Patient Education  2024 [employer].

## 2023-06-02 ENCOUNTER — Other Ambulatory Visit: Payer: Self-pay | Admitting: Internal Medicine

## 2023-06-02 DIAGNOSIS — L719 Rosacea, unspecified: Secondary | ICD-10-CM

## 2023-06-05 ENCOUNTER — Encounter: Payer: Self-pay | Admitting: Surgery

## 2023-06-09 ENCOUNTER — Ambulatory Visit (INDEPENDENT_AMBULATORY_CARE_PROVIDER_SITE_OTHER): Payer: Managed Care, Other (non HMO) | Admitting: Surgery

## 2023-06-09 ENCOUNTER — Other Ambulatory Visit: Payer: Self-pay | Admitting: Internal Medicine

## 2023-06-09 VITALS — BP 132/90 | HR 125 | Temp 98.5°F | Ht 60.0 in | Wt 161.8 lb

## 2023-06-09 DIAGNOSIS — K645 Perianal venous thrombosis: Secondary | ICD-10-CM

## 2023-06-09 DIAGNOSIS — G43009 Migraine without aura, not intractable, without status migrainosus: Secondary | ICD-10-CM

## 2023-06-09 NOTE — Progress Notes (Signed)
[facility] POST-OP OFFICE VISIT  06/09/2023  HPI: Amy Gomez is a 42 y.o. female 14 days s/p excision of thrombosed external hemorrhoid, anterior.  She reports she had some swelling that extended into her posterior vaginal wall after her procedure which has diminished and resolved.  Her stools have been quite loose, and it was in discussion yesterday she had had some soilage.  She is not taking any fiber or laxatives.  And she believes this has more to do with her personal issues and relationships.  She denies any pain.  She denies any fevers or chills.  She denies any nausea or vomiting.  Vital signs: BP (!) 132/90   Pulse (!) 125   Temp 98.5 F (36.9 C) (Oral)   Ht 5' (1.524 m)   Wt 161 lb 12.8 oz (73.4 kg)   LMP 05/11/2023 (Approximate)   SpO2 96%   BMI 31.60 kg/m    Physical Exam: Constitutional: She appears well. Abdomen: Benign nontender Skin: Amy Gomez is present as chaperone.  The area of her external hemorrhoid appears to be healing nicely, I do not appreciate any fissure like activity or residual scarring.  There is no gross blood present.  There is no evidence of any erythema or induration or adjacent tenderness.  Assessment/Plan: This is a 42 y.o. female 14 days s/p excision of external thrombosed hemorrhoid.  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/11/2019   Pre-diabetes 01/26/2019   Liver mass 01/25/2019   Essential hypertension 07/15/2017   Gastroesophageal reflux disease without esophagitis 12/18/2016   History of  kidney stones 06/04/2015   Dysmenorrhea 06/04/2015   Migraine without aura and without status migrainosus, not intractable 06/04/2015    -We discussed fiber as her bowels return to their normal activity, hopefully preventing any type of straining or bearing down in the future.  Will be glad to see her in the interim should there be any new concerns arise.   Campbell Lerner M.D., FACS 06/09/2023, 9:13 AM

## 2023-06-09 NOTE — Patient Instructions (Signed)
If you have any concerns or questions, please feel free to call our office.   Excision of Skin Lesions, Care After The following information offers guidance on how to care for yourself after your procedure. Your health care provider may also give you more specific instructions. If you have problems or questions, contact your health care provider. What can I expect after the procedure? After your procedure, it is common to have: Soreness or mild pain. Some redness and swelling. Follow these instructions at home: Excision site care  Follow instructions from your health care provider about how to take care of your excision site. Make sure you: Wash your hands with soap and water for at least 20 seconds before and after you change your bandage (dressing). If soap and water are not available, use hand sanitizer. Change your dressing as told by your health care provider. Leave stitches (sutures), skin glue, or adhesive strips in place. These skin closures may need to stay in place for 2 weeks or longer. If adhesive strip edges start to loosen and curl up, you may trim the loose edges. Do not remove adhesive strips completely unless your health care provider tells you to do that. Check the excision area every day for signs of infection. Watch for: More redness, swelling, or pain. Fluid or blood. Warmth. Pus or a bad smell. Keep the site clean, dry, and protected for at least 48 hours. For bleeding, apply gentle but firm pressure to the area using a folded towel for 20 minutes. Do not take baths, swim, or use a hot tub until your health care provider approves. Ask your health care provider if you may take showers. You may only be allowed to take sponge baths. General instructions Take over-the-counter and prescription medicines only as told by your health care provider. Follow instructions from your health care provider about how to minimize scarring. Scarring should lessen over time. Avoid sun  exposure until the area has healed. Use sunscreen to protect the area from the sun after it has healed. Avoid high-impact exercise and activities until the sutures are removed or the area heals. Keep all follow-up visits. This is important. Contact a health care provider if: You have more redness, swelling, or pain around your excision site. You have fluid or blood coming from your excision site. Your excision site feels warm to the touch. You have pus or a bad smell coming from your excision site. You have a fever. You have pain that does not improve in 2-3 days after your procedure. Get help right away if: You have bleeding that does not stop with pressure or a dressing. Your wound opens up. Summary Take over-the-counter and prescription medicines only as told by your health care provider. Change your dressing as told by your health care provider. Contact a health care provider if you have redness, swelling, pain, or other signs of infection around your excision site. Keep all follow-up visits. This is important. This information is not intended to replace advice given to you by your health care provider. Make sure you discuss any questions you have with your health care provider. Document Revised: 05/28/2021 Document Reviewed: 05/28/2021 Elsevier Patient Education  2024 Elsevier Inc.  

## 2023-06-10 ENCOUNTER — Ambulatory Visit
Admission: RE | Admit: 2023-06-10 | Discharge: 2023-06-10 | Disposition: A | Discharge status: Home / Self Care | Payer: Managed Care, Other (non HMO) | Source: Ambulatory Visit | Attending: Internal Medicine | Admitting: Internal Medicine

## 2023-06-10 DIAGNOSIS — Z1231 Encounter for screening mammogram for malignant neoplasm of breast: Secondary | ICD-10-CM | POA: Insufficient documentation

## 2023-06-19 ENCOUNTER — Ambulatory Visit: Payer: Managed Care, Other (non HMO) | Admitting: Internal Medicine

## 2023-06-30 ENCOUNTER — Other Ambulatory Visit: Payer: Self-pay | Admitting: Internal Medicine

## 2023-06-30 DIAGNOSIS — G43009 Migraine without aura, not intractable, without status migrainosus: Secondary | ICD-10-CM

## 2023-07-01 ENCOUNTER — Other Ambulatory Visit: Payer: Self-pay | Admitting: Internal Medicine

## 2023-07-01 DIAGNOSIS — G43009 Migraine without aura, not intractable, without status migrainosus: Secondary | ICD-10-CM

## 2023-07-01 NOTE — Telephone Encounter (Signed)
Please review.  KP

## 2023-07-10 ENCOUNTER — Ambulatory Visit (INDEPENDENT_AMBULATORY_CARE_PROVIDER_SITE_OTHER): Payer: Managed Care, Other (non HMO) | Admitting: Internal Medicine

## 2023-07-10 ENCOUNTER — Encounter: Payer: Self-pay | Admitting: Internal Medicine

## 2023-07-10 VITALS — BP 108/60 | HR 87 | Resp 95 | Ht 60.0 in | Wt 153.0 lb

## 2023-07-10 DIAGNOSIS — I10 Essential (primary) hypertension: Secondary | ICD-10-CM

## 2023-07-10 DIAGNOSIS — G43009 Migraine without aura, not intractable, without status migrainosus: Secondary | ICD-10-CM | POA: Diagnosis not present

## 2023-07-10 NOTE — Assessment & Plan Note (Signed)
Normal exam with stable BP on lisinopril resumed last visit. No concerns or side effects to current medication. No change in regimen; continue low sodium diet.

## 2023-07-10 NOTE — Assessment & Plan Note (Signed)
Recent improvement in the character and frequency of headaches. May be due to reduced stress since husband moved out. Headaches respond well to current therapy with [name].  Samples given Will continue current plan and follow up if worsening.

## 2023-07-10 NOTE — Progress Notes (Signed)
Date:  07/10/2023   Name:  Amy Gomez   DOB:  Oct 04, 1981   MRN:  [id number]   Chief Complaint: Hypertension and Migraine (Not having them as much but still having them may be due to stress )  Hypertension This is a chronic problem. The problem is controlled. Associated symptoms include headaches. Pertinent negatives include no chest pain, palpitations or shortness of breath. Past treatments include ACE inhibitors. The current treatment provides significant improvement. There are no compliance problems.  There is no history of kidney disease, CAD/MI or CVA.  Migraine  This is a recurrent problem. The problem has been gradually improving. The pain is located in the Frontal region. The pain does not radiate. Pertinent negatives include no abdominal pain, coughing, dizziness or weakness. Exacerbated by: HA better since husband moved out. Treatments tried: [name] was helpful but not covered by insurance. Her past medical history is significant for hypertension.    Lab Results  Component Value Date   NA 135 05/08/2023   K 3.8 05/08/2023   CO2 26 05/08/2023   GLUCOSE 98 05/08/2023   BUN 11 05/08/2023   CREATININE 0.77 05/08/2023   CALCIUM 8.4 (L) 05/08/2023   GFRNONAA >60 05/08/2023   Lab Results  Component Value Date   CHOL 143 05/08/2023   HDL 40 (L) 05/08/2023   LDLCALC 93 05/08/2023   TRIG 52 05/08/2023   CHOLHDL 3.6 05/08/2023   Lab Results  Component Value Date   TSH 0.498 05/08/2023   Lab Results  Component Value Date   HGBA1C 5.9 (H) 05/08/2023   Lab Results  Component Value Date   WBC 12.1 (H) 05/08/2023   HGB 13.9 05/08/2023   HCT 40.1 05/08/2023   MCV 83.9 05/08/2023   PLT 240 05/08/2023   Lab Results  Component Value Date   ALT 19 05/08/2023   AST 20 05/08/2023   ALKPHOS 88 05/08/2023   BILITOT <0.1 (L) 05/08/2023   No results found for: "25OHVITD2", "25OHVITD3", "VD25OH"   Review of Systems  Constitutional:  Negative for chills, fatigue and  unexpected weight change.  HENT:  Negative for nosebleeds.   Eyes:  Negative for visual disturbance.  Respiratory:  Negative for cough, chest tightness, shortness of breath and wheezing.   Cardiovascular:  Negative for chest pain, palpitations and leg swelling.  Gastrointestinal:  Negative for abdominal pain, constipation and diarrhea.  Neurological:  Positive for headaches. Negative for dizziness, weakness and light-headedness.  Psychiatric/Behavioral:  Negative for dysphoric mood and sleep disturbance. The patient is not nervous/anxious.     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/11/2019   Pre-diabetes 01/26/2019   Liver mass 01/25/2019   Essential hypertension 07/15/2017   Gastroesophageal reflux disease without esophagitis 12/18/2016   History of kidney stones 06/04/2015   Dysmenorrhea 06/04/2015   Migraine without aura and without status migrainosus, not intractable 06/04/2015    Allergies  Allergen Reactions   Sulfa Antibiotics Anaphylaxis, Swelling, Rash and Other (See Comments)   Azithromycin Rash    Rash as a teenager, "Z Pack"    Past Surgical History:  Procedure Laterality Date  DILATION AND CURETTAGE OF UTERUS     TUBAL LIGATION Bilateral 2007    Social History   Tobacco Use   Smoking status: Every Day    Current packs/day: 0.50    Average packs/day: 0.5 packs/day for 28.7 years (14.3 ttl pk-yrs)    Types: Cigarettes    Start date: 1996   Smokeless tobacco: Never  Vaping Use   Vaping status: Former  Substance Use Topics    Alcohol use: No    Alcohol/week: 0.0 standard drinks of alcohol   Drug use: No     Medication list has been reviewed and updated.  Current Meds  Medication Sig   busPIRone (BUSPAR) 10 MG tablet Take 1 tablet (10 mg total) by mouth 3 (three) times daily.   clindamycin (CLEOCIN T) 1 % external solution APPLY  SOLUTION TOPICALLY TWICE DAILY   lisinopril (ZESTRIL) 30 MG tablet Take 1 tablet (30 mg total) by mouth daily.   [name] 75 MG TBDP DISSOLVE 1 TABLET BY MOUTH EVERY OTHER DAY   traMADol (ULTRAM) 50 MG tablet Take 1 tablet (50 mg total) by mouth every 12 (twelve) hours as needed.   [DISCONTINUED] naproxen (NAPROSYN) 500 MG tablet Take 1 tablet (500 mg total) by mouth 2 (two) times daily with a meal.       07/10/2023   10:03 AM 05/08/2023    8:19 AM 01/16/2023    2:03 PM 07/18/2022    9:38 AM  GAD 7 : Generalized Anxiety Score  Nervous, Anxious, on Edge 2 3 3 3   Control/stop worrying 2 3 3 3   Worry too much - different things 2 2 3 3   Trouble relaxing 2 3 3 3   Restless 2 3 3 3   Easily annoyed or irritable 0 3 3 3   Afraid - awful might happen 0 3 3 3   Total GAD 7 Score 10 20 21 21   Anxiety Difficulty Not difficult at all Very difficult Extremely difficult Extremely difficult       07/10/2023   10:03 AM 05/08/2023    8:19 AM 01/16/2023    2:02 PM  Depression screen PHQ 2/9  Decreased Interest 0 2 0  Down, Depressed, Hopeless 0 1 0  PHQ - 2 Score 0 3 0  Altered sleeping 3 3 0  Tired, decreased energy 0 2 0  Change in appetite 2 3 0  Feeling bad or failure about yourself  0 3 0  Trouble concentrating 0 2 0  Moving slowly or fidgety/restless 0 2 0  Suicidal thoughts 0 0 0  PHQ-9 Score 5 18 0  Difficult doing work/chores Not difficult at all Very difficult Not difficult at all    BP Readings from Last 3 Encounters:  07/10/23 108/60  06/09/23 (!) 132/90  05/26/23 (!) 144/94    Physical Exam Vitals and nursing note reviewed.  Constitutional:      General: She is not in  acute distress.    Appearance: Normal appearance. She is well-developed.  HENT:     Head: Normocephalic and atraumatic.  Cardiovascular:     Rate and Rhythm: Normal rate and regular rhythm.  Pulmonary:     Effort: Pulmonary effort is normal. No respiratory distress.     Breath sounds: No wheezing or rhonchi.  Musculoskeletal:     Cervical back: Normal range of motion.     Right lower leg: No edema.     Left lower leg: No edema.  Skin:    General: Skin is warm and dry.  Findings: No rash.  Neurological:     Mental Status: She is alert and oriented to person, place, and time.  Psychiatric:        Mood and Affect: Mood normal.        Behavior: Behavior normal.     Wt Readings from Last 3 Encounters:  07/10/23 153 lb (69.4 kg)  06/09/23 161 lb 12.8 oz (73.4 kg)  05/26/23 174 lb 3.2 oz (79 kg)    BP 108/60   Pulse 87   Resp (!) 95   Ht 5' (1.524 m)   Wt 153 lb (69.4 kg)   BMI 29.88 kg/m   Assessment and Plan:  Problem List Items Addressed This Visit       Unprioritized   Migraine without aura and without status migrainosus, not intractable (Chronic)    Recent improvement in the character and frequency of headaches. May be due to reduced stress since husband moved out. Headaches respond well to current therapy with [name].  Samples given Will continue current plan and follow up if worsening.       Essential hypertension - Primary (Chronic)    Normal exam with stable BP on lisinopril resumed last visit. No concerns or side effects to current medication. No change in regimen; continue low sodium diet.        No follow-ups on file.    Reubin Milan, MD [facility] [place]

## 2023-07-23 ENCOUNTER — Other Ambulatory Visit: Payer: Self-pay | Admitting: Internal Medicine

## 2023-07-23 DIAGNOSIS — G43009 Migraine without aura, not intractable, without status migrainosus: Secondary | ICD-10-CM

## 2023-07-24 ENCOUNTER — Other Ambulatory Visit: Payer: Self-pay | Admitting: Internal Medicine

## 2023-07-24 DIAGNOSIS — G43009 Migraine without aura, not intractable, without status migrainosus: Secondary | ICD-10-CM

## 2023-07-24 NOTE — Telephone Encounter (Signed)
Please review.  KP

## 2023-07-24 NOTE — Telephone Encounter (Signed)
Requested medication (s) are due for refill today:yes  Requested medication (s) are on the active medication list: yes  Last refill:  07/01/23 #10 tabs  Future visit scheduled:no  Notes to clinic:  Med not delegated to NT to RF    Requested Prescriptions  Pending Prescriptions Disp Refills   traMADol (ULTRAM) 50 MG tablet [Pharmacy Med Name: traMADol HCl 50 MG Oral Tablet] 10 tablet 0    Sig: TAKE 1 TABLET BY MOUTH EVERY 12 HOURS AS NEEDED     Not Delegated - Analgesics:  Opioid Agonists Failed - 07/23/2023  5:29 PM      Failed - This refill cannot be delegated      Failed - Urine Drug Screen completed in last 360 days      Passed - Valid encounter within last 3 months    Recent Outpatient Visits           2 weeks ago Essential hypertension   [facility], Nyoka Cowden, MD   2 months ago Annual physical exam   [facility] at [facility], Nyoka Cowden, MD   5 months ago Sacro-iliac pain   [facility], Nyoka Cowden, MD   6 months ago Need for Tdap vaccination   [facility] at [facility], Nyoka Cowden, MD   10 months ago Acute rhinosinusitis   [facility] at [facility], Ocie Bob, MD

## 2023-07-24 NOTE — Telephone Encounter (Signed)
Please advise 

## 2023-07-29 ENCOUNTER — Telehealth: Payer: Managed Care, Other (non HMO) | Admitting: Internal Medicine

## 2023-07-29 ENCOUNTER — Encounter: Payer: Self-pay | Admitting: Internal Medicine

## 2023-07-29 VITALS — Ht 60.0 in | Wt 153.0 lb

## 2023-07-29 DIAGNOSIS — J0100 Acute maxillary sinusitis, unspecified: Secondary | ICD-10-CM | POA: Diagnosis not present

## 2023-07-29 MED ORDER — AMOXICILLIN 875 MG PO TABS
875.0000 mg | ORAL_TABLET | Freq: Two times a day (BID) | ORAL | 0 refills | Status: AC
Start: 2023-07-29 — End: 2023-08-08

## 2023-07-29 NOTE — Progress Notes (Signed)
Date:  07/29/2023   Name:  Amy Gomez   DOB:  Jan 08, 1981   MRN:  [id number]   This encounter was conducted via video encounter. This platform was deemed appropriate for the issues to be addressed.  The patient was correctly identified.  I advised that I am conducting the visit from a secure room in my office at [facility] clinic.  The patient is located at work. The limitations of this form of encounter were discussed with the patient and he/she agreed to proceed.  Some vital signs will be absent.  Chief Complaint: Sinusitis (X 2 days. Sinus pressure- nasal pressure, and eye pressure. No cough. No fever.)  Sinusitis This is a new problem. The current episode started yesterday. There has been no fever. Associated symptoms include congestion, ear pain, headaches, sinus pressure and a sore throat. Pertinent negatives include no chills or coughing.    Lab Results  Component Value Date   NA 135 05/08/2023   K 3.8 05/08/2023   CO2 26 05/08/2023   GLUCOSE 98 05/08/2023   BUN 11 05/08/2023   CREATININE 0.77 05/08/2023   CALCIUM 8.4 (L) 05/08/2023   GFRNONAA >60 05/08/2023   Lab Results  Component Value Date   CHOL 143 05/08/2023   HDL 40 (L) 05/08/2023   LDLCALC 93 05/08/2023   TRIG 52 05/08/2023   CHOLHDL 3.6 05/08/2023   Lab Results  Component Value Date   TSH 0.498 05/08/2023   Lab Results  Component Value Date   HGBA1C 5.9 (H) 05/08/2023   Lab Results  Component Value Date   WBC 12.1 (H) 05/08/2023   HGB 13.9 05/08/2023   HCT 40.1 05/08/2023   MCV 83.9 05/08/2023   PLT 240 05/08/2023   Lab Results  Component Value Date   ALT 19 05/08/2023   AST 20 05/08/2023   ALKPHOS 88 05/08/2023   BILITOT <0.1 (L) 05/08/2023   No results found for: "25OHVITD2", "25OHVITD3", "VD25OH"   Review of Systems  Constitutional:  Negative for chills, fatigue and fever.  HENT:  Positive for congestion, ear pain, sinus pressure and sore throat.   Respiratory:  Negative  for cough and chest tightness.   Cardiovascular:  Negative for chest pain.  Neurological:  Positive for headaches.    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/11/2019   Pre-diabetes 01/26/2019   Liver mass 01/25/2019   Essential hypertension 07/15/2017   Gastroesophageal reflux disease without esophagitis 12/18/2016   History of kidney stones 06/04/2015   Dysmenorrhea 06/04/2015   Migraine without aura and without status migrainosus, not intractable 06/04/2015    Allergies  Allergen Reactions   Sulfa Antibiotics Anaphylaxis, Swelling, Rash and Other (See Comments)   Azithromycin Rash    Rash as a teenager, "Z Pack"    Past Surgical History:  Procedure Laterality Date   DILATION AND CURETTAGE OF UTERUS     TUBAL LIGATION Bilateral 2007    Social History   Tobacco Use   Smoking status: Every Day    Current packs/day: 0.50    Average packs/day: 0.5 packs/day for 28.7 years (14.4 ttl pk-yrs)  Types: Cigarettes    Start date: 1996   Smokeless tobacco: Never  Vaping Use   Vaping status: Former  Substance Use Topics   Alcohol use: No    Alcohol/week: 0.0 standard drinks of alcohol   Drug use: No     Medication list has been reviewed and updated.  Current Meds  Medication Sig   amoxicillin (AMOXIL) 875 MG tablet Take 1 tablet (875 mg total) by mouth 2 (two) times daily for 10 days.   busPIRone (BUSPAR) 10 MG tablet Take 1 tablet (10 mg total) by mouth 3 (three) times daily.   clindamycin (CLEOCIN T) 1 % external solution APPLY  SOLUTION  TOPICALLY TWICE DAILY   dibucaine (NUPERCAINAL) 1 % OINT Place 1 Application rectally as needed for hemorrhoids.   Eflornithine HCl 13.9 % cream Apply 1 Application topically in the morning and at bedtime.   lisinopril (ZESTRIL) 30 MG tablet Take 1 tablet (30 mg total) by mouth daily.   [name] 75 MG TBDP DISSOLVE 1 TABLET BY MOUTH EVERY OTHER DAY   traMADol (ULTRAM) 50 MG tablet TAKE 1 TABLET BY MOUTH EVERY 12 HOURS AS NEEDED       07/29/2023   11:29 AM 07/10/2023   10:03 AM 05/08/2023    8:19 AM 01/16/2023    2:03 PM  GAD 7 : Generalized Anxiety Score  Nervous, Anxious, on Edge 0 2 3 3   Control/stop worrying 0 2 3 3   Worry too much - different things 0 2 2 3   Trouble relaxing 0 2 3 3   Restless 0 2 3 3   Easily annoyed or irritable 0 0 3 3  Afraid - awful might happen 0 0 3 3  Total GAD 7 Score 0 10 20 21   Anxiety Difficulty Not difficult at all Not difficult at all Very difficult Extremely difficult       07/29/2023   11:29 AM 07/10/2023   10:03 AM 05/08/2023    8:19 AM  Depression screen PHQ 2/9  Decreased Interest 0 0 2  Down, Depressed, Hopeless 0 0 1  PHQ - 2 Score 0 0 3  Altered sleeping 0 3 3  Tired, decreased energy 0 0 2  Change in appetite 0 2 3  Feeling bad or failure about yourself  0 0 3  Trouble concentrating 0 0 2  Moving slowly or fidgety/restless 0 0 2  Suicidal thoughts 0 0 0  PHQ-9 Score 0 5 18  Difficult doing work/chores Not difficult at all Not difficult at all Very difficult    BP Readings from Last 3 Encounters:  07/10/23 108/60  06/09/23 (!) 132/90  05/26/23 (!) 144/94    Physical Exam Constitutional:      General: She is not in acute distress.    Appearance: Normal appearance. She is not ill-appearing.  HENT:     Head:     Comments: Tender across maxillary sinuses with self palpation Pulmonary:     Effort: Pulmonary effort is normal.  Neurological:     General: No focal deficit present.     Mental Status: She is alert.     Wt  Readings from Last 3 Encounters:  07/29/23 153 lb (69.4 kg)  07/10/23 153 lb (69.4 kg)  06/09/23 161 lb 12.8 oz (73.4 kg)    Ht 5' (1.524 m)   Wt 153 lb (69.4 kg)   BMI 29.88 kg/m   Assessment and Plan:  Problem List Items Addressed This Visit   None Visit Diagnoses  Acute non-recurrent maxillary sinusitis    -  Primary   Continue Benadryl bid prn consider Flonase spray if available   Relevant Medications   amoxicillin (AMOXIL) 875 MG tablet      I spent 5 minutes on this encounter, 100% by video. No follow-ups on file.    Reubin Milan, MD [facility] [place]

## 2023-07-29 NOTE — Telephone Encounter (Signed)
Can pt do VV?  Amy Gomez

## 2023-08-17 ENCOUNTER — Other Ambulatory Visit: Payer: Self-pay | Admitting: Internal Medicine

## 2023-08-17 DIAGNOSIS — G43009 Migraine without aura, not intractable, without status migrainosus: Secondary | ICD-10-CM

## 2023-08-17 NOTE — Telephone Encounter (Signed)
Please review.  KP

## 2023-08-19 ENCOUNTER — Other Ambulatory Visit: Payer: Self-pay | Admitting: Internal Medicine

## 2023-08-19 DIAGNOSIS — G43009 Migraine without aura, not intractable, without status migrainosus: Secondary | ICD-10-CM

## 2023-08-19 NOTE — Telephone Encounter (Signed)
Requested medication (s) are due for refill today - provider review   Requested medication (s) are on the active medication list -yes  Future visit scheduled -no  Last refill: 07/26/23 #10  Notes to clinic: non delegated Rx  Requested Prescriptions  Pending Prescriptions Disp Refills   traMADol (ULTRAM) 50 MG tablet [Pharmacy Med Name: traMADol HCl 50 MG Oral Tablet] 10 tablet 0    Sig: TAKE 1 TABLET BY MOUTH EVERY 12 HOURS AS NEEDED     Not Delegated - Analgesics:  Opioid Agonists Failed - 08/19/2023 11:33 AM      Failed - This refill cannot be delegated      Failed - Urine Drug Screen completed in last 360 days      Passed - Valid encounter within last 3 months    Recent Outpatient Visits           3 weeks ago Acute non-recurrent maxillary sinusitis   [place] [facility] at [facility], Nyoka Cowden, MD   1 month ago Essential hypertension   [place] [facility] at [facility], Nyoka Cowden, MD   3 months ago Annual physical exam   [facility] at [facility], Nyoka Cowden, MD   6 months ago Sacro-iliac pain   [place] [facility] at [facility], Nyoka Cowden, MD   7 months ago Need for Tdap vaccination   [facility] at [facility], Nyoka Cowden, MD                 Requested Prescriptions  Pending Prescriptions Disp Refills   traMADol (ULTRAM) 50 MG tablet [Pharmacy Med Name: traMADol HCl 50 MG Oral Tablet] 10 tablet 0    Sig: TAKE 1 TABLET BY MOUTH EVERY 12 HOURS AS NEEDED     Not Delegated - Analgesics:  Opioid Agonists Failed - 08/19/2023 11:33 AM      Failed - This refill cannot be delegated      Failed - Urine Drug Screen completed in last 360 days      Passed - Valid encounter within last 3 months    Recent Outpatient Visits           3 weeks ago Acute non-recurrent maxillary  sinusitis   [place] [facility] at [facility], Nyoka Cowden, MD   1 month ago Essential hypertension   [place] [facility] at [facility], Nyoka Cowden, MD   3 months ago Annual physical exam   [facility] at [facility], Nyoka Cowden, MD   6 months ago Sacro-iliac pain    Primary Care & Sports Medicine at [facility], Nyoka Cowden, MD   7 months ago Need for Tdap vaccination   [facility] at [facility], Nyoka Cowden, MD

## 2023-08-19 NOTE — Telephone Encounter (Signed)
Please advise 

## 2023-08-20 ENCOUNTER — Encounter: Payer: Self-pay | Admitting: Internal Medicine

## 2023-08-20 ENCOUNTER — Ambulatory Visit: Payer: Managed Care, Other (non HMO) | Admitting: Internal Medicine

## 2023-08-20 VITALS — BP 124/70 | HR 99 | Ht 60.0 in | Wt 148.0 lb

## 2023-08-20 DIAGNOSIS — R1111 Vomiting without nausea: Secondary | ICD-10-CM

## 2023-08-20 LAB — POCT INFLUENZA A/B
Influenza A, POC: NEGATIVE
Influenza B, POC: NEGATIVE

## 2023-08-20 LAB — POC COVID19 BINAXNOW: SARS Coronavirus 2 Ag: NEGATIVE

## 2023-08-20 NOTE — Progress Notes (Signed)
Date:  08/20/2023   Name:  Amy Gomez   DOB:  [birth year]   MRN:  [id number]   Chief Complaint: Vomiting (Patient said she threw up and work this morning and they are requiring to be tested for the flu.)  Emesis  This is a new problem. The current episode started today. The problem occurs less than 2 times per day. The problem has been unchanged. There has been no fever. Pertinent negatives include no chest pain, chills or fever.  She ate something that did not taste fresh this AM and then drank a coffee drink with 4 shots of espresso.  She felt nauseated then vomited once,  Since then she has felt well.  However, employer requires that she be seen and evaluated.  Review of Systems  Constitutional:  Negative for chills, diaphoresis and fever.  Respiratory:  Negative for chest tightness and shortness of breath.   Cardiovascular:  Negative for chest pain.  Gastrointestinal:  Positive for vomiting. Negative for constipation and nausea.  Psychiatric/Behavioral:  Negative for sleep disturbance.      Lab Results  Component Value Date   NA 135 05/08/2023   K 3.8 05/08/2023   CO2 26 05/08/2023   GLUCOSE 98 05/08/2023   BUN 11 05/08/2023   CREATININE 0.77 05/08/2023   CALCIUM 8.4 (L) 05/08/2023   GFRNONAA >60 05/08/2023   Lab Results  Component Value Date   CHOL 143 05/08/2023   HDL 40 (L) 05/08/2023   LDLCALC 93 05/08/2023   TRIG 52 05/08/2023   CHOLHDL 3.6 05/08/2023   Lab Results  Component Value Date   TSH 0.498 05/08/2023   Lab Results  Component Value Date   HGBA1C 5.9 (H) 05/08/2023   Lab Results  Component Value Date   WBC 12.1 (H) 05/08/2023   HGB 13.9 05/08/2023   HCT 40.1 05/08/2023   MCV 83.9 05/08/2023   PLT 240 05/08/2023   Lab Results  Component Value Date   ALT 19 05/08/2023   AST 20 05/08/2023   ALKPHOS 88 05/08/2023   BILITOT <0.1 (L) 05/08/2023   No results found for: "25OHVITD2", "25OHVITD3", "VD25OH"   Patient Active Problem List    Diagnosis Date Noted   Thrombosed external hemorrhoid 05/26/2023   Excess body and facial hair 01/16/2023   Severe episode of recurrent major depressive disorder, without psychotic features (HCC) 04/11/2022   Recurrent periodic urticaria 12/09/2021   Left cervical radiculopathy 02/04/2021   Supraspinatus tendinitis, left 02/04/2021   Cervical paraspinal muscle spasm 02/04/2021   Rosacea 01/09/2020   Panic disorder with agoraphobia 07/11/2019   Pre-diabetes 01/26/2019   Liver mass 01/25/2019   Essential hypertension 07/15/2017   Gastroesophageal reflux disease without esophagitis 12/18/2016   History of kidney stones 06/04/2015   Dysmenorrhea 06/04/2015   Migraine without aura and without status migrainosus, not intractable 06/04/2015    Allergies  Allergen Reactions   Sulfa Antibiotics Anaphylaxis, Swelling, Rash and Other (See Comments)   Azithromycin Rash    Rash as a teenager, "Z Pack"    Past Surgical History:  Procedure Laterality Date   DILATION AND CURETTAGE OF UTERUS     TUBAL LIGATION Bilateral 2007    Social History   Tobacco Use   Smoking status: Former    Current packs/day: 0.00    Average packs/day: 0.5 packs/day for 28.6 years (14.3 ttl pk-yrs)    Types: Cigarettes    Start date: 72    Quit date: 06/11/2023    Years since  quitting: 0.1   Smokeless tobacco: Never  Vaping Use   Vaping status: Former  Substance Use Topics   Alcohol use: No    Alcohol/week: 0.0 standard drinks of alcohol   Drug use: No     Medication list has been reviewed and updated.  Current Meds  Medication Sig   busPIRone (BUSPAR) 10 MG tablet Take 1 tablet (10 mg total) by mouth 3 (three) times daily.   clindamycin (CLEOCIN T) 1 % external solution APPLY  SOLUTION TOPICALLY TWICE DAILY   dibucaine (NUPERCAINAL) 1 % OINT Place 1 Application rectally as needed for hemorrhoids.   Eflornithine HCl 13.9 % cream Apply 1 Application topically in the morning and at bedtime.    lisinopril (ZESTRIL) 30 MG tablet Take 1 tablet (30 mg total) by mouth daily.   [name] 75 MG TBDP DISSOLVE 1 TABLET BY MOUTH EVERY OTHER DAY   traMADol (ULTRAM) 50 MG tablet TAKE 1 TABLET BY MOUTH EVERY 12 HOURS AS NEEDED       07/29/2023   11:29 AM 07/10/2023   10:03 AM 05/08/2023    8:19 AM 01/16/2023    2:03 PM  GAD 7 : Generalized Anxiety Score  Nervous, Anxious, on Edge 0 2 3 3   Control/stop worrying 0 2 3 3   Worry too much - different things 0 2 2 3   Trouble relaxing 0 2 3 3   Restless 0 2 3 3   Easily annoyed or irritable 0 0 3 3  Afraid - awful might happen 0 0 3 3  Total GAD 7 Score 0 10 20 21   Anxiety Difficulty Not difficult at all Not difficult at all Very difficult Extremely difficult       07/29/2023   11:29 AM 07/10/2023   10:03 AM 05/08/2023    8:19 AM  Depression screen PHQ 2/9  Decreased Interest 0 0 2  Down, Depressed, Hopeless 0 0 1  PHQ - 2 Score 0 0 3  Altered sleeping 0 3 3  Tired, decreased energy 0 0 2  Change in appetite 0 2 3  Feeling bad or failure about yourself  0 0 3  Trouble concentrating 0 0 2  Moving slowly or fidgety/restless 0 0 2  Suicidal thoughts 0 0 0  PHQ-9 Score 0 5 18  Difficult doing work/chores Not difficult at all Not difficult at all Very difficult    BP Readings from Last 3 Encounters:  08/20/23 124/70  07/10/23 108/60  06/09/23 (!) 132/90    Physical Exam Vitals and nursing note reviewed.  Constitutional:      General: She is not in acute distress.    Appearance: Normal appearance. She is well-developed.  HENT:     Head: Normocephalic and atraumatic.  Cardiovascular:     Rate and Rhythm: Normal rate and regular rhythm.  Pulmonary:     Effort: Pulmonary effort is normal. No respiratory distress.     Breath sounds: No wheezing or rhonchi.  Abdominal:     General: Abdomen is flat.     Palpations: Abdomen is soft.     Tenderness: There is no abdominal tenderness.  Skin:    General: Skin is warm and dry.      Findings: No rash.  Neurological:     Mental Status: She is alert and oriented to person, place, and time.  Psychiatric:        Mood and Affect: Mood normal.        Behavior: Behavior normal.     Wt  Readings from Last 3 Encounters:  08/20/23 148 lb (67.1 kg)  07/29/23 153 lb (69.4 kg)  07/10/23 153 lb (69.4 kg)    BP 124/70   Pulse 99   Ht 5' (1.524 m)   Wt 148 lb (67.1 kg)   SpO2 99%   BMI 28.90 kg/m   Assessment and Plan:  Problem List Items Addressed This Visit   None Visit Diagnoses     Vomiting without nausea, unspecified vomiting type    -  Primary   likely triggered by recent food and drink intake no evidence of infection - she can return to work   Relevant Orders   POC COVID-19 BinaxNow (Completed)   POCT Influenza A/B (Completed)       No follow-ups on file.    Reubin Milan, MD [facility] Medicine [place]

## 2023-08-25 ENCOUNTER — Other Ambulatory Visit: Payer: Self-pay | Admitting: Internal Medicine

## 2023-08-25 DIAGNOSIS — G43009 Migraine without aura, not intractable, without status migrainosus: Secondary | ICD-10-CM

## 2023-08-25 NOTE — Telephone Encounter (Signed)
Please review.  KP

## 2023-08-26 MED ORDER — TRAMADOL HCL 50 MG PO TABS: 50.0000 mg | ORAL_TABLET | Freq: Two times a day (BID) | ORAL | 0 refills | Status: DC | PRN

## 2023-09-20 ENCOUNTER — Other Ambulatory Visit: Payer: Self-pay | Admitting: Internal Medicine

## 2023-09-20 DIAGNOSIS — G43009 Migraine without aura, not intractable, without status migrainosus: Secondary | ICD-10-CM

## 2023-09-21 ENCOUNTER — Other Ambulatory Visit: Payer: Self-pay | Admitting: Internal Medicine

## 2023-09-21 ENCOUNTER — Ambulatory Visit: Payer: Self-pay | Admitting: *Deleted

## 2023-09-21 DIAGNOSIS — G43009 Migraine without aura, not intractable, without status migrainosus: Secondary | ICD-10-CM

## 2023-09-21 MED ORDER — TRAMADOL HCL 50 MG PO TABS: 50.0000 mg | ORAL_TABLET | Freq: Two times a day (BID) | ORAL | 0 refills | Status: DC | PRN

## 2023-09-21 NOTE — Telephone Encounter (Signed)
Please review.  KP

## 2023-09-21 NOTE — Telephone Encounter (Signed)
  Chief Complaint: Migraine. Symptoms: States typical migraine starting. Also with severe "Period cramps." States sees Dr. Judithann Graves for both issues.  Frequency: Headache started this AM Pertinent Negatives: Patient denies  Disposition: [] ED /[] Urgent Care (no appt availability in office) / [] Appointment(In office/virtual)/ []  [facility]/ [] Home Care/ [] Refused Recommended Disposition /[] [facility]/ [x]  Follow-up with PCP Additional Notes:  Declines appt. "I have to work all week and need to knock this out as soon as possible. I get these all the time." Pt requesting tramadol be sent in to [facility] in [place]. States only thing that helps with both issues.  Please advise. Reason for Disposition  [1] MODERATE headache (e.g., interferes with normal activities) AND [2] present > 24 hours AND [3] unexplained  (Exceptions: analgesics not tried, typical migraine, or headache part of viral illness)  Answer Assessment - Initial Assessment Questions 1. LOCATION: "Where does it hurt?"      *No Answer* 2. ONSET: "When did the headache start?" (Minutes, hours or days)      An hour ago 3. PATTERN: "Does the pain come and go, or has it been constant since it started?"     Constant 4. SEVERITY: "How bad is the pain?" and "What does it keep you from doing?"  (e.g., Scale 1-10; mild, moderate, or severe)   - MILD (1-3): doesn't interfere with normal activities    - MODERATE (4-7): interferes with normal activities or awakens from sleep    - SEVERE (8-10): excruciating pain, unable to do any normal activities        Getting worse 5. RECURRENT SYMPTOM: "Have you ever had headaches before?" If Yes, ask: "When was the last time?" and "What happened that time?"      *No Answer* 6. CAUSE: "What do you think is causing the headache?"     *No Answer* 7. MIGRAINE: "Have you been diagnosed with migraine headaches?" If Yes, ask: "Is this headache similar?"      Yes 8. HEAD INJURY: "Has there  been any recent injury to the head?"      no 9. OTHER SYMPTOMS: "Do you have any other symptoms?" (fever, stiff neck, eye pain, sore throat, cold symptoms)     Typical migraine. Stomach cramps from period.  Protocols used: [facility]

## 2023-09-22 NOTE — Telephone Encounter (Signed)
Duplicate   KP 

## 2023-09-22 NOTE — Telephone Encounter (Signed)
Requested medication (s) are due for refill today:   Provider to review  Requested medication (s) are on the active medication list:   Yes  Future visit scheduled:   No    LOV 08/20/2023   Last ordered: 09/21/2023 #10, 0 refills  Non delegated refill    Requested Prescriptions  Pending Prescriptions Disp Refills   traMADol (ULTRAM) 50 MG tablet [Pharmacy Med Name: traMADol HCl 50 MG Oral Tablet] 10 tablet 0    Sig: TAKE 1 TABLET BY MOUTH EVERY 12 HOURS AS NEEDED     Not Delegated - Analgesics:  Opioid Agonists Failed - 09/20/2023 11:08 AM      Failed - This refill cannot be delegated      Failed - Urine Drug Screen completed in last 360 days      Passed - Valid encounter within last 3 months    Recent Outpatient Visits           1 month ago Vomiting without nausea, unspecified vomiting type   [facility] at [facility], Nyoka Cowden, MD   1 month ago Acute non-recurrent maxillary sinusitis   [place] [facility] at [facility], Nyoka Cowden, MD   2 months ago Essential hypertension   [place] [facility] at [facility], Nyoka Cowden, MD   4 months ago Annual physical exam   [facility] Primary Care [facility] Medicine at [facility], Nyoka Cowden, MD   7 months ago Sacro-iliac pain   [facility] Medicine at [facility], Nyoka Cowden, MD

## 2023-10-07 ENCOUNTER — Encounter: Payer: Self-pay | Admitting: Internal Medicine

## 2023-10-07 NOTE — Telephone Encounter (Signed)
Please review.  KP

## 2023-10-13 ENCOUNTER — Encounter: Payer: Self-pay | Admitting: Internal Medicine

## 2023-10-13 ENCOUNTER — Ambulatory Visit (INDEPENDENT_AMBULATORY_CARE_PROVIDER_SITE_OTHER): Payer: MEDICAID | Admitting: Internal Medicine

## 2023-10-13 VITALS — BP 124/70 | HR 103 | Ht 60.0 in | Wt 143.0 lb

## 2023-10-13 DIAGNOSIS — I10 Essential (primary) hypertension: Secondary | ICD-10-CM | POA: Diagnosis not present

## 2023-10-13 DIAGNOSIS — L719 Rosacea, unspecified: Secondary | ICD-10-CM

## 2023-10-13 DIAGNOSIS — G43009 Migraine without aura, not intractable, without status migrainosus: Secondary | ICD-10-CM

## 2023-10-13 DIAGNOSIS — F4001 Agoraphobia with panic disorder: Secondary | ICD-10-CM | POA: Diagnosis not present

## 2023-10-13 MED ORDER — SUMATRIPTAN SUCCINATE 50 MG PO TABS: 50.0000 mg | ORAL_TABLET | ORAL | 3 refills | Status: DC | PRN

## 2023-10-13 NOTE — Assessment & Plan Note (Signed)
Recurrent symptoms - unable to afford topical metronidazole at this time.

## 2023-10-13 NOTE — Assessment & Plan Note (Addendum)
Controlled BP with normal exam. Current regimen is lisinopril.  She has a dry cough so will stop lisinopril briefly. Call for alternative medication if cough resolves.

## 2023-10-13 NOTE — Progress Notes (Signed)
Date:  10/13/2023   Name:  Amy Gomez   DOB:  09-16-1981   MRN:  [id number]   Chief Complaint: Migraine (Getting worse. Possibly due to stress.), Rash (Rash on both arms, hands and fingers. Itching. When touching onions at work, she breaks out. She said she breaks out often.), and Anxiety (Patient is having frequent panic attacks. Unsure of why. She is in a happy relationship, but going through a divorce. Said she is having to depend on her kids for transportation. )  Migraine  This is a recurrent problem. The problem has been gradually worsening. The pain quality is similar to prior headaches. Pertinent negatives include no abdominal pain, coughing, dizziness or weakness.  Rash This is a recurrent problem. The rash is diffuse. Pertinent negatives include no cough, diarrhea, fatigue or shortness of breath.  Anxiety Symptoms include nervous/anxious behavior. Patient reports no chest pain, dizziness, palpitations, shortness of breath or suicidal ideas.      Review of Systems  Constitutional:  Negative for chills, fatigue and unexpected weight change.  HENT:  Negative for nosebleeds.   Eyes:  Negative for visual disturbance.  Respiratory:  Negative for cough, chest tightness, shortness of breath and wheezing.   Cardiovascular:  Negative for chest pain, palpitations and leg swelling.  Gastrointestinal:  Negative for abdominal pain, constipation and diarrhea.  Skin:  Positive for rash.  Neurological:  Positive for headaches. Negative for dizziness, weakness and light-headedness.  Psychiatric/Behavioral:  Negative for dysphoric mood, sleep disturbance and suicidal ideas. The patient is nervous/anxious.      Lab Results  Component Value Date   NA 135 05/08/2023   K 3.8 05/08/2023   CO2 26 05/08/2023   GLUCOSE 98 05/08/2023   BUN 11 05/08/2023   CREATININE 0.77 05/08/2023   CALCIUM 8.4 (L) 05/08/2023   GFRNONAA >60 05/08/2023   Lab Results  Component Value Date   CHOL 143  05/08/2023   HDL 40 (L) 05/08/2023   LDLCALC 93 05/08/2023   TRIG 52 05/08/2023   CHOLHDL 3.6 05/08/2023   Lab Results  Component Value Date   TSH 0.498 05/08/2023   Lab Results  Component Value Date   HGBA1C 5.9 (H) 05/08/2023   Lab Results  Component Value Date   WBC 12.1 (H) 05/08/2023   HGB 13.9 05/08/2023   HCT 40.1 05/08/2023   MCV 83.9 05/08/2023   PLT 240 05/08/2023   Lab Results  Component Value Date   ALT 19 05/08/2023   AST 20 05/08/2023   ALKPHOS 88 05/08/2023   BILITOT <0.1 (L) 05/08/2023   No results found for: "25OHVITD2", "25OHVITD3", "VD25OH"   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/11/2019   Pre-diabetes 01/26/2019   Liver mass 01/25/2019   Essential hypertension 07/15/2017   Gastroesophageal reflux disease without esophagitis 12/18/2016   History of kidney stones 06/04/2015   Dysmenorrhea 06/04/2015   Migraine without aura and without status migrainosus, not intractable 06/04/2015    Allergies  Allergen Reactions   Sulfa Antibiotics Anaphylaxis, Swelling, Rash and Other (See Comments)   Azithromycin Rash    Rash as a teenager, "  Z Pack"    Past Surgical History:  Procedure Laterality Date   DILATION AND CURETTAGE OF UTERUS     TUBAL LIGATION Bilateral 2007    Social History   Tobacco Use   Smoking status: Former    Current packs/day: 0.00    Average packs/day: 0.5 packs/day for 28.6 years (14.3 ttl pk-yrs)    Types: Cigarettes    Start date: 43    Quit date: 06/11/2023    Years since quitting: 0.3    Smokeless tobacco: Never  Vaping Use   Vaping status: Former  Substance Use Topics   Alcohol use: No    Alcohol/week: 0.0 standard drinks of alcohol   Drug use: No     Medication list has been reviewed and updated.  Current Meds  Medication Sig   lisinopril (ZESTRIL) 30 MG tablet Take 1 tablet (30 mg total) by mouth daily.   SUMAtriptan (IMITREX) 50 MG tablet Take 1 tablet (50 mg total) by mouth every 2 (two) hours as needed for migraine. May repeat in 2 hours if headache persists or recurs.   traMADol (ULTRAM) 50 MG tablet Take 1 tablet (50 mg total) by mouth every 12 (twelve) hours as needed.   [DISCONTINUED] busPIRone (BUSPAR) 10 MG tablet Take 1 tablet (10 mg total) by mouth 3 (three) times daily.       10/13/2023    8:56 AM 07/29/2023   11:29 AM 07/10/2023   10:03 AM 05/08/2023    8:19 AM  GAD 7 : Generalized Anxiety Score  Nervous, Anxious, on Edge 2 0 2 3  Control/stop worrying 2 0 2 3  Worry too much - different things 2 0 2 2  Trouble relaxing 0 0 2 3  Restless 0 0 2 3  Easily annoyed or irritable 0 0 0 3  Afraid - awful might happen 2 0 0 3  Total GAD 7 Score 8 0 10 20  Anxiety Difficulty Not difficult at all Not difficult at all Not difficult at all Very difficult       10/13/2023    8:56 AM 07/29/2023   11:29 AM 07/10/2023   10:03 AM  Depression screen PHQ 2/9  Decreased Interest 0 0 0  Down, Depressed, Hopeless 0 0 0  PHQ - 2 Score 0 0 0  Altered sleeping 0 0 3  Tired, decreased energy 0 0 0  Change in appetite 0 0 2  Feeling bad or failure about yourself  0 0 0  Trouble concentrating 0 0 0  Moving slowly or fidgety/restless 0 0 0  Suicidal thoughts 0 0 0  PHQ-9 Score 0 0 5  Difficult doing work/chores Not difficult at all Not difficult at all Not difficult at all    BP Readings from Last 3 Encounters:  10/13/23 124/70  08/20/23 124/70  07/10/23 108/60    Physical Exam Vitals and nursing note reviewed.  Constitutional:      General: She is not  in acute distress.    Appearance: Normal appearance. She is well-developed.  HENT:     Head: Normocephalic and atraumatic.  Neck:     Vascular: No carotid bruit.  Cardiovascular:     Rate and Rhythm: Normal rate and regular rhythm.     Heart sounds: No murmur heard. Pulmonary:     Effort: Pulmonary effort is normal. No respiratory distress.     Breath sounds: No wheezing or rhonchi.  Musculoskeletal:     Cervical back: Normal range of motion.  Right lower leg: No edema.     Left lower leg: No edema.  Lymphadenopathy:     Cervical: No cervical adenopathy.  Skin:    General: Skin is warm and dry.     Findings: No rash.  Neurological:     General: No focal deficit present.     Mental Status: She is alert and oriented to person, place, and time.  Psychiatric:        Mood and Affect: Mood is anxious.        Speech: Speech normal.        Behavior: Behavior normal.        Cognition and Memory: Cognition normal.        Judgment: Judgment normal.     Wt Readings from Last 3 Encounters:  10/13/23 143 lb (64.9 kg)  08/20/23 148 lb (67.1 kg)  07/29/23 153 lb (69.4 kg)    BP 124/70   Pulse (!) 103   Ht 5' (1.524 m)   Wt 143 lb (64.9 kg)   SpO2 100%   BMI 27.93 kg/m   Assessment and Plan:  Problem List Items Addressed This Visit       Unprioritized   Essential hypertension (Chronic)    Controlled BP with normal exam. Current regimen is lisinopril.  She has a dry cough so will stop lisinopril briefly. Call for alternative medication if cough resolves.        Migraine without aura and without status migrainosus, not intractable - Primary (Chronic)    Taking tramadol for headache due to affordability Would like her to reduce this and try imitrex 50 mg instead.      Relevant Medications   SUMAtriptan (IMITREX) 50 MG tablet   Panic disorder with agoraphobia (Chronic)    Recurrent anxiety with recent life events Does not want to take medications - we discussed  approaching each issue in turn - mold in house, car in need of repair, family issues/divorce, etc.      Rosacea    Recurrent symptoms - unable to afford topical metronidazole at this time.       No follow-ups on file.    Reubin Milan, MD [facility] Primary Care and Sports Medicine [place]

## 2023-10-13 NOTE — Assessment & Plan Note (Signed)
Recurrent anxiety with recent life events Does not want to take medications - we discussed approaching each issue in turn - mold in house, car in need of repair, family issues/divorce, etc.

## 2023-10-13 NOTE — Assessment & Plan Note (Signed)
Taking tramadol for headache due to affordability Would like her to reduce this and try imitrex 50 mg instead.

## 2023-10-16 ENCOUNTER — Other Ambulatory Visit: Payer: Self-pay | Admitting: Internal Medicine

## 2023-10-16 ENCOUNTER — Encounter: Payer: Self-pay | Admitting: Internal Medicine

## 2023-10-16 DIAGNOSIS — G43009 Migraine without aura, not intractable, without status migrainosus: Secondary | ICD-10-CM

## 2023-10-16 MED ORDER — RIZATRIPTAN BENZOATE 10 MG PO TABS
10.0000 mg | ORAL_TABLET | ORAL | 0 refills | Status: DC | PRN
Start: 2023-10-16 — End: 2024-06-15

## 2023-10-21 ENCOUNTER — Ambulatory Visit: Payer: MEDICAID

## 2023-10-21 ENCOUNTER — Ambulatory Visit
Admission: EM | Admit: 2023-10-21 | Discharge: 2023-10-21 | Disposition: A | Discharge status: Home / Self Care | Payer: MEDICAID | Attending: Internal Medicine | Admitting: Internal Medicine

## 2023-10-21 DIAGNOSIS — W19XXXA Unspecified fall, initial encounter: Secondary | ICD-10-CM | POA: Diagnosis not present

## 2023-10-21 DIAGNOSIS — M25531 Pain in right wrist: Secondary | ICD-10-CM

## 2023-10-21 MED ORDER — NAPROXEN 500 MG PO TABS: 500.0000 mg | ORAL_TABLET | Freq: Two times a day (BID) | ORAL | 0 refills | Status: DC | PRN

## 2023-10-21 NOTE — Discharge Instructions (Signed)
Please keep your right wrist and the wrist brace and take only off if you need to shower.  Please follow-up with orthopedics as soon as possible for further evaluation and treatment of your symptoms.  You may take naproxen twice daily as needed for pain.  You may elevate and ice the wrist as needed.  Please go to the ER for any worsening symptoms that occur prior to seeing orthopedics.  This includes but is not limited to uncontrolled pain or swelling, persistent numbness or tingling, or any new concerns that arise.  I hope you feel better soon!

## 2023-10-21 NOTE — ED Provider Notes (Signed)
[facility]    CSN: [id number] Arrival date & time: 10/21/23  0940      History   Chief Complaint Chief Complaint  Patient presents with   Arm Pain   Fall    HPI Amy Gomez is a 42 y.o. female presents for wrist and forearm pain after fall.  Patient reports 1 hour prior to arrival she slipped on some stairs landing onto her right wrist and right forearm.  Denies head injury or LOC.  Reports pain and swelling for the mid forearm that extends to the wrist.  No numbness or tingling.  Reports history of forearm fracture several years ago that did not require surgery.  She has not taken any OTC medications for symptoms since fall.  No other concerns at this time.   Arm Pain  Fall    Past Medical History:  Diagnosis Date   Hypertension    Kidney stones    Leukocytosis 08/16/2018   Migraine     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/11/2019   Pre-diabetes 01/26/2019   Liver mass 01/25/2019   Essential hypertension 07/15/2017   Gastroesophageal reflux disease without esophagitis 12/18/2016   History of kidney stones 06/04/2015   Dysmenorrhea 06/04/2015   Migraine without aura and without status migrainosus, not intractable 06/04/2015    Past Surgical History:  Procedure Laterality Date   DILATION AND CURETTAGE OF UTERUS     TUBAL LIGATION Bilateral 2007    OB History     Gravida  4   Para  3   Term  2   Preterm  1   AB  1   Living  3      SAB  1   IAB      Ectopic      Multiple      Live Births  3             Home Medications    Prior to Admission medications   Medication Sig Start Date End Date Taking? Authorizing Provider  lisinopril (ZESTRIL) 30 MG tablet Take 1 tablet (30 mg total) by mouth daily. 05/25/23  Yes Reubin Milan, MD  naproxen (NAPROSYN) 500 MG tablet Take 1 tablet (500 mg total) by mouth 2 (two) times daily as needed (wrist pain). 10/21/23  Yes Radford Pax, NP  rizatriptan (MAXALT) 10 MG tablet Take 1 tablet (10 mg total) by mouth as needed for migraine. May repeat in 2 hours if needed 10/16/23  Yes Reubin Milan, MD  traMADol (ULTRAM) 50 MG tablet Take 1 tablet (50 mg total) by mouth every 12 (twelve) hours as needed. 09/21/23  Yes Reubin Milan, MD  diphenhydrAMINE HCl (BENADRYL PO) Take by mouth as needed.  09/03/20  [provider]  omeprazole (PRILOSEC) 10 MG capsule Take 1 capsule (10 mg total) by mouth daily. Patient not taking: Reported on 09/26/2019 09/24/19 12/06/19  Cuthriell, Delorise Royals, PA-C    Family History Family History  Problem Relation Age of Onset   Other Mother        unknown medical history   Other  Father        unknown medical hisotry   Endometriosis Maternal Grandmother    Breast cancer Paternal Grandmother     Social History Social History   Tobacco Use   Smoking status: Former    Current packs/day: 0.00    Average packs/day: 0.5 packs/day for 28.6 years (14.3 ttl pk-yrs)    Types: Cigarettes    Start date: 2    Quit date: 06/11/2023    Years since quitting: 0.3   Smokeless tobacco: Never  Vaping Use   Vaping status: Former  Substance Use Topics   Alcohol use: No    Alcohol/week: 0.0 standard drinks of alcohol   Drug use: No     Allergies   Sulfa antibiotics and Azithromycin   Review of Systems Review of Systems  Musculoskeletal:        Right forearm and wrist pain after fall     Physical Exam Triage Vital Signs ED Triage Vitals  Encounter Vitals Group     BP 10/21/23 1018 125/85      Systolic BP Percentile --      Diastolic BP Percentile --      Pulse Rate 10/21/23 1018 90     Resp 10/21/23 1018 16     Temp 10/21/23 1018 99.2 F (37.3 C)     Temp Source 10/21/23 1018 Oral     SpO2 10/21/23 1018 98 %     Weight 10/21/23 1018 143 lb (64.9 kg)     Height 10/21/23 1018 5' (1.524 m)     Head Circumference --      Peak Flow --      Pain Score 10/21/23 1024 6     Pain Loc --      Pain Education --      Exclude from Growth Chart --    No data found.  Updated Vital Signs BP 125/85 (BP Location: Left Arm)   Pulse 90   Temp 99.2 F (37.3 C) (Oral)   Resp 16   Ht 5' (1.524 m)   Wt 143 lb (64.9 kg)   SpO2 98%   BMI 27.93 kg/m   Visual Acuity Right Eye Distance:   Left Eye Distance:   Bilateral Distance:    Right Eye Near:   Left Eye Near:    Bilateral Near:     Physical Exam Vitals and nursing note reviewed.  Constitutional:      General: She is not in acute distress.    Appearance: Normal appearance. She is not ill-appearing.  HENT:     Head: Normocephalic and atraumatic.  Eyes:     Pupils: Pupils are equal, round, and reactive to light.  Cardiovascular:     Rate and Rhythm: Normal rate.  Pulmonary:     Effort: Pulmonary effort is normal.  Musculoskeletal:     Right forearm: Swelling, tenderness and bony tenderness present. No deformity or lacerations.     Right wrist: Swelling, tenderness and crepitus present. No deformity, effusion, lacerations, bony tenderness or snuff box tenderness. Decreased range of motion. Abnormal pulse.       Arms:     Comments: Tender to palpation to the mid forearm that extends to wrist.  Tender to palpation to the mid dorsum of the wrist.  Pain with rotation of the forearm or flexion of the wrist.  No tenderness with palpation to hand or fingers or elbow.  Cap refill +2 in all digits.  Skin:    General: Skin is warm  and dry.  Neurological:     General: No focal deficit present.     Mental Status: She is alert and  oriented to person, place, and time.  Psychiatric:        Mood and Affect: Mood normal.        Behavior: Behavior normal.      UC Treatments / Results  Labs (all labs ordered are listed, but only abnormal results are displayed) Labs Reviewed - No data to display  EKG   Radiology DG Wrist Complete Right  Result Date: 10/21/2023 CLINICAL DATA:  Fall, pain EXAM: RIGHT WRIST - COMPLETE 3+ VIEW COMPARISON:  10/21/2023 FINDINGS: There is no evidence of fracture or dislocation. There is no evidence of arthropathy or other focal bone abnormality. Soft tissues are unremarkable. IMPRESSION: No acute abnormality by plain radiography. Electronically Signed   By: Judie Petit.  Shick M.D.   On: 10/21/2023 11:14   DG Forearm Right  Result Date: 10/21/2023 CLINICAL DATA:  Fall, arm pain EXAM: RIGHT FOREARM - 2 VIEW COMPARISON:  10/21/2023 FINDINGS: Right radius and ulna intact with normal alignment. No acute osseous finding or fracture. No joint abnormality. Soft tissues unremarkable. IMPRESSION: Negative. Electronically Signed   By: Judie Petit.  Shick M.D.   On: 10/21/2023 11:13    Procedures Procedures (including critical care time)  Medications Ordered in UC Medications - No data to display  Initial Impression / Assessment and Plan / UC Course  I have reviewed the triage vital signs and the nursing notes.  Pertinent labs & imaging results that were available during my care of the patient were reviewed by me and considered in my medical decision making (see chart for details).     Reviewed exam and symptoms with patient.  Radiology read of x-ray shows no fracture.  Concern for possible distal radial fracture, reviewed images with Dr Rachael Darby. Will place in thumb spica wrist brace and refer to orthopedics for further workup.  Naproxen as needed for pain.  Discussed RICE therapy.  Advised ER evaluation for any worsening symptoms prior to seeing orthopedics.  Red flags reviewed and patient verbalized  understanding. Final Clinical Impressions(s) / UC Diagnoses   Final diagnoses:  Fall, initial encounter  Right wrist pain     Discharge Instructions      Please keep your right wrist and the wrist brace and take only off if you need to shower.  Please follow-up with orthopedics as soon as possible for further evaluation and treatment of your symptoms.  You may take naproxen twice daily as needed for pain.  You may elevate and ice the wrist as needed.  Please go to the ER for any worsening symptoms that occur prior to seeing orthopedics.  This includes but is not limited to uncontrolled pain or swelling, persistent numbness or tingling, or any new concerns that arise.  I hope you feel better soon!     ED Prescriptions     Medication Sig Dispense Auth. Provider   naproxen (NAPROSYN) 500 MG tablet Take 1 tablet (500 mg total) by mouth 2 (two) times daily as needed (wrist pain). 14 tablet Radford Pax, NP      PDMP not reviewed this encounter.   Radford Pax, NP 10/21/23 [phone]

## 2023-10-21 NOTE — ED Triage Notes (Signed)
Pt c/o R arm pain d/t fall about 1 hr ago. States she slid off top step.

## 2023-11-06 ENCOUNTER — Encounter: Payer: Self-pay | Admitting: Physician Assistant

## 2023-11-06 ENCOUNTER — Ambulatory Visit: Payer: Self-pay | Admitting: *Deleted

## 2023-11-06 ENCOUNTER — Telehealth (INDEPENDENT_AMBULATORY_CARE_PROVIDER_SITE_OTHER): Payer: MEDICAID | Admitting: Physician Assistant

## 2023-11-06 VITALS — Ht 60.0 in

## 2023-11-06 DIAGNOSIS — K529 Noninfective gastroenteritis and colitis, unspecified: Secondary | ICD-10-CM | POA: Diagnosis not present

## 2023-11-06 NOTE — Telephone Encounter (Signed)
Pt had an appt today.  KP

## 2023-11-06 NOTE — Patient Instructions (Signed)
-

## 2023-11-06 NOTE — Telephone Encounter (Signed)
  Chief Complaint: vomiting x 6 able to tolerate liquids.  Symptoms: started vomiting 1130 am yesterday . Tolerating fluids eating causes abdominal cramping / burning. Developed cough since yesterday . Headache  hx migraines Frequency: yesterday Pertinent Negatives: Patient denies fever no blood in emesis. No diarrhea  Disposition: [] ED /[] Urgent Care (no appt availability in office) / [x] Appointment(In office/virtual)/ []  [facility]/ [] Home Care/ [] Refused Recommended Disposition /[]  Mobile Bus/ []  Follow-up with PCP Additional Notes:   My chart VV scheduled today . None available with PCP. Patient has no transportation at this time. Requesting work note. Patient currently at work and requested to leave work. Please advise.       Reason for Disposition  [1] MILD or MODERATE vomiting AND [2] present > 48 hours (2 days) (Exception: Mild vomiting with associated diarrhea.)    Vomiting x 6 , can tolerate fluids. Eating solids causes abdominal cramping/ burning. No fever  Answer Assessment - Initial Assessment Questions 1. VOMITING SEVERITY: "How many times have you vomited in the past 24 hours?"     - MILD:  1 - 2 times/day    - MODERATE: 3 - 5 times/day, decreased oral intake without significant weight loss or symptoms of dehydration    - SEVERE: 6 or more times/day, vomits everything or nearly everything, with significant weight loss, symptoms of dehydration      More than 6 times  2. ONSET: "When did the vomiting begin?"      1130 am yesterday  3. FLUIDS: "What fluids or food have you vomited up today?" "Have you been able to keep any fluids down?"     Can only sip on water 4. ABDOMEN PAIN: "Are your having any abdomen pain?" If Yes : "How bad is it and what does it feel like?" (e.g., crampy, dull, intermittent, constant)      Abdominal burning/ cramping  when eats food  5. DIARRHEA: "Is there any diarrhea?" If Yes, ask: "How many times today?"      Na  6.  CONTACTS: "Is there anyone else in the family with the same symptoms?"      Son in law  7. CAUSE: "What do you think is causing your vomiting?"     Not sure "some kind of virus" 8. HYDRATION STATUS: "Any signs of dehydration?" (e.g., dry mouth [not only dry lips], too weak to stand) "When did you last urinate?"     Na  9. OTHER SYMPTOMS: "Do you have any other symptoms?" (e.g., fever, headache, vertigo, vomiting blood or coffee grounds, recent head injury)     Headache hx migraine vomiting  on and off since 1130 am yesterday . Now coughing.  10. PREGNANCY: "Is there any chance you are pregnant?" "When was your last menstrual period?"       na  Protocols used: Vomiting-A-AH

## 2023-11-06 NOTE — Progress Notes (Signed)
Date:  11/06/2023   Name:  Amy Gomez   DOB:  03-19-1981   MRN:  [id number]   I connected with Amy Gomez on 11/06/23 via MyChart Video and verified that I am speaking with the correct person using appropriate identifiers. The limitations, risks, security and privacy concerns of performing an evaluation and management service by MyChart Video, including the higher likelihood of inaccurate diagnoses and treatments, and the availability of in person appointments were reviewed. The possible need of an additional face-to-face encounter for complete and high quality delivery of care was discussed. The patient was also made aware that there may be a patient responsible charge related to this service. The patient expressed understanding and wishes to proceed.   Provider location is in medical facility [facility] at [facility]). Patient location is at their home People involved in care of the patient during this telehealth encounter were myself, my CMA, and my front office/scheduling team member.    Chief Complaint: Cough  Cough   Amy Gomez presents virtually today for evaluation of cough and vomiting for the last 24 hours, seems to be improving somewhat but had to leave work today and requests a work note.  Known exposure to sick niece who was also vomiting.  Oral challenge a few hours ago with meatball unsuccessful.  Able to keep fluids down.  Has not done home COVID test.   Medication list has been reviewed and updated.  Current Meds  Medication Sig   lisinopril (ZESTRIL) 30 MG tablet Take 1 tablet (30 mg total) by mouth daily.   rizatriptan (MAXALT) 10 MG tablet Take 1 tablet (10 mg total) by mouth as needed for migraine. May repeat in 2 hours if needed     Review of Systems  Respiratory:  Positive for cough.     Patient Active Problem List   Diagnosis Date Noted   Thrombosed external hemorrhoid 05/26/2023   Excess body and facial hair  01/16/2023   Severe episode of recurrent major depressive disorder, without psychotic features (HCC) 04/11/2022   Recurrent periodic urticaria 12/09/2021   Left cervical radiculopathy 02/04/2021   Supraspinatus tendinitis, left 02/04/2021   Cervical paraspinal muscle spasm 02/04/2021   Rosacea 01/09/2020   Panic disorder with agoraphobia 07/11/2019   Pre-diabetes 01/26/2019   Liver mass 01/25/2019   Essential hypertension 07/15/2017   Gastroesophageal reflux disease without esophagitis 12/18/2016   History of kidney stones 06/04/2015   Dysmenorrhea 06/04/2015   Migraine without aura and without status migrainosus, not intractable 06/04/2015    Allergies  Allergen Reactions   Sulfa Antibiotics Anaphylaxis, Swelling, Rash and Other (See Comments)   Azithromycin Rash    Rash as a teenager, "Z Pack"    Immunization History  Administered Date(s) Administered   Influenza, Seasonal, Injecte, Preservative Fre 11/12/2012   Influenza,inj,Quad PF,6+ Mos 08/01/2013, 08/03/2014, 08/27/2015, 09/04/2016, 08/11/2018, 08/06/2019, 08/19/2021, 07/18/2022   PFIZER(Purple Top)SARS-COV-2 Vaccination 05/31/2020, 06/21/2020   Pneumococcal Polysaccharide-23 06/14/2018   Tdap 01/16/2023    Past Surgical History:  Procedure Laterality Date   DILATION AND CURETTAGE OF UTERUS     TUBAL LIGATION Bilateral 2007    Social History   Tobacco Use   Smoking status: Former    Current packs/day: 0.00    Average packs/day: 0.5 packs/day for 28.6 years (14.3 ttl pk-yrs)    Types: Cigarettes    Start date: 67    Quit date: 06/11/2023    Years since quitting: 0.4   Smokeless tobacco:  Never  Vaping Use   Vaping status: Former  Substance Use Topics   Alcohol use: No    Alcohol/week: 0.0 standard drinks of alcohol   Drug use: No    Family History  Problem Relation Age of Onset   Other Mother        unknown medical history   Other Father        unknown medical hisotry   Endometriosis Maternal  Grandmother    Breast cancer Paternal Grandmother         11/06/2023    1:18 PM 10/13/2023    8:56 AM 07/29/2023   11:29 AM 07/10/2023   10:03 AM  GAD 7 : Generalized Anxiety Score  Nervous, Anxious, on Edge 2 2 0 2  Control/stop worrying 2 2 0 2  Worry too much - different things 2 2 0 2  Trouble relaxing 0 0 0 2  Restless 0 0 0 2  Easily annoyed or irritable 0 0 0 0  Afraid - awful might happen 1 2 0 0  Total GAD 7 Score 7 8 0 10  Anxiety Difficulty Not difficult at all Not difficult at all Not difficult at all Not difficult at all       11/06/2023    1:18 PM 10/13/2023    8:56 AM 07/29/2023   11:29 AM  Depression screen PHQ 2/9  Decreased Interest 0 0 0  Down, Depressed, Hopeless 0 0 0  PHQ - 2 Score 0 0 0  Altered sleeping 0 0 0  Tired, decreased energy 0 0 0  Change in appetite 0 0 0  Feeling bad or failure about yourself  0 0 0  Trouble concentrating 0 0 0  Moving slowly or fidgety/restless 0 0 0  Suicidal thoughts 0 0 0  PHQ-9 Score 0 0 0  Difficult doing work/chores Not difficult at all Not difficult at all Not difficult at all    BP Readings from Last 3 Encounters:  10/21/23 125/85  10/13/23 124/70  08/20/23 124/70    Wt Readings from Last 3 Encounters:  10/21/23 143 lb (64.9 kg)  10/13/23 143 lb (64.9 kg)  08/20/23 148 lb (67.1 kg)    Ht 5' (1.524 m)   BMI 27.93 kg/m   Physical Exam General: Speaking full sentences, no audible heavy breathing. Sounds alert and appropriately interactive. Well-appearing. Face symmetric. Extraocular movements intact. Pupils equal and round. No nasal flaring or accessory muscle use visualized.  Recent Labs     Component Value Date/Time   NA 135 05/08/2023 0906   NA 140 10/18/2020 0000   NA 138 03/03/2015 0458   K 3.8 05/08/2023 0906   K 3.6 03/03/2015 0458   CL 105 05/08/2023 0906   CL 106 03/03/2015 0458   CO2 26 05/08/2023 0906   CO2 23 03/03/2015 0458   GLUCOSE 98 05/08/2023 0906   GLUCOSE 137 (H)  03/03/2015 0458   BUN 11 05/08/2023 0906   BUN 7 10/18/2020 0000   BUN 11 03/03/2015 0458   CREATININE 0.77 05/08/2023 0906   CREATININE 0.81 03/03/2015 0458   CALCIUM 8.4 (L) 05/08/2023 0906   CALCIUM 9.0 03/03/2015 0458   PROT 6.8 05/08/2023 0906   PROT 6.9 03/20/2017 1017   PROT 7.2 03/03/2015 0458   ALBUMIN 3.7 05/08/2023 0906   ALBUMIN 4.4 03/20/2017 1017   ALBUMIN 4.1 03/03/2015 0458   AST 20 05/08/2023 0906   AST 23 03/03/2015 0458   ALT 19 05/08/2023 0906   ALT  16 03/03/2015 0458   ALKPHOS 88 05/08/2023 0906   ALKPHOS 72 03/03/2015 0458   BILITOT <0.1 (L) 05/08/2023 0906   BILITOT 0.8 03/20/2017 1017   BILITOT 0.4 03/03/2015 0458   GFRNONAA >60 05/08/2023 0906   GFRNONAA >60 03/03/2015 0458   GFRAA >60 02/14/2020 1542   GFRAA >60 03/03/2015 0458    Lab Results  Component Value Date   WBC 12.1 (H) 05/08/2023   HGB 13.9 05/08/2023   HCT 40.1 05/08/2023   MCV 83.9 05/08/2023   PLT 240 05/08/2023   Lab Results  Component Value Date   HGBA1C 5.9 (H) 05/08/2023   Lab Results  Component Value Date   CHOL 143 05/08/2023   HDL 40 (L) 05/08/2023   LDLCALC 93 05/08/2023   TRIG 52 05/08/2023   CHOLHDL 3.6 05/08/2023   Lab Results  Component Value Date   TSH 0.498 05/08/2023     Assessment and Plan:  1. Acute gastroenteritis (Primary) Patient reassured likely viral etiology.  Advised prioritizing intake of clear or mostly clear fluids such as water, sports drinks, small sips of ginger ale. Eat small amounts of bland foods like crackers, toast, soup. Slowly advance diet as tolerated.  Work note written to excuse for any absence yesterday and today.  She should be okay to return for her next scheduled shift at 11/09/2023 as long as she is feeling better.     No follow-ups on file.   I discussed the above assessment and treatment plan with the patient. The patient was provided an opportunity to ask questions and all were answered. The patient agreed with the  plan and demonstrated an understanding of the instructions. The patient was advised to call back or seek an in-person evaluation if the symptoms worsen or if the condition fails to improve as anticipated. I provided a total time of 12 minutes inclusive of time utilized for medical chart review, information gathering, care coordination with staff, and documentation completion.  Alvester Morin, PA-C, DMSc, Nutritionist [facility] [phone]

## 2023-11-18 ENCOUNTER — Other Ambulatory Visit: Payer: Self-pay | Admitting: Internal Medicine

## 2023-11-18 DIAGNOSIS — G43009 Migraine without aura, not intractable, without status migrainosus: Secondary | ICD-10-CM

## 2023-11-18 NOTE — Telephone Encounter (Signed)
 Medication Refill -  Most Recent Primary Care Visit:  Provider: MANYA TORIBIO SQUIBB  Department: [facility]  Visit Type: MYCHART VIDEO VISIT  Date: 11/06/2023  Medication:traMADol  (ULTRAM ) 50 MG tablet   Has the patient contacted their pharmacy? No   Is this the correct pharmacy for this prescription? Yes   [facility] [address], [place] - [address] [place] [address] Phone: [phone] Fax: [phone]   Has the prescription been filled recently? Yes  Is the patient out of the medication? Yes  Has the patient been seen for an appointment in the last year OR does the patient have an upcoming appointment? Yes  Can we respond through MyChart? No  Agent: Please be advised that Rx refills may take up to 3 business days. We ask that you follow-up with your pharmacy.

## 2023-11-23 MED ORDER — TRAMADOL HCL 50 MG PO TABS: 50.0000 mg | ORAL_TABLET | Freq: Two times a day (BID) | ORAL | 0 refills | Status: DC | PRN

## 2023-11-23 NOTE — Telephone Encounter (Signed)
 Requested medications are due for refill today.  yes  Requested medications are on the active medications list.  yes  Last refill. 09/21/2023 #10 0 rf  Future visit scheduled.   no  Notes to clinic.  Refill not delegated.    Requested Prescriptions  Pending Prescriptions Disp Refills   traMADol  (ULTRAM ) 50 MG tablet 10 tablet 0    Sig: Take 1 tablet (50 mg total) by mouth every 12 (twelve) hours as needed.     Not Delegated - Analgesics:  Opioid Agonists Failed - 11/23/2023  8:42 AM      Failed - This refill cannot be delegated      Failed - Urine Drug Screen completed in last 360 days      Passed - Valid encounter within last 3 months    Recent Outpatient Visits           2 weeks ago Acute gastroenteritis   [facility] at [facility], Toribio SQUIBB, PA   1 month ago Migraine without aura and without status migrainosus, not intractable   [facility] at [facility], Leita DEL, MD   3 months ago Vomiting without nausea, unspecified vomiting type   [facility] at [facility], Leita DEL, MD   3 months ago Acute non-recurrent maxillary sinusitis   [facility] at [facility], Leita DEL, MD   4 months ago Essential hypertension   [facility] at [facility], Leita DEL, MD

## 2023-11-23 NOTE — Telephone Encounter (Signed)
 Please review.  KP

## 2023-12-11 ENCOUNTER — Encounter: Payer: Self-pay | Admitting: Internal Medicine

## 2023-12-24 ENCOUNTER — Encounter: Payer: Self-pay | Admitting: Internal Medicine

## 2024-01-02 ENCOUNTER — Encounter: Payer: Self-pay | Admitting: Internal Medicine

## 2024-01-04 NOTE — Telephone Encounter (Signed)
 FYI  KP

## 2024-01-05 ENCOUNTER — Encounter: Payer: Self-pay | Admitting: Internal Medicine

## 2024-01-05 ENCOUNTER — Other Ambulatory Visit (HOSPITAL_COMMUNITY)
Admission: RE | Admit: 2024-01-05 | Discharge: 2024-01-05 | Disposition: A | Discharge status: Home / Self Care | Payer: MEDICAID | Source: Ambulatory Visit | Attending: Internal Medicine | Admitting: Internal Medicine

## 2024-01-05 ENCOUNTER — Ambulatory Visit (INDEPENDENT_AMBULATORY_CARE_PROVIDER_SITE_OTHER): Payer: MEDICAID | Admitting: Internal Medicine

## 2024-01-05 VITALS — BP 124/78 | HR 88 | Ht 60.0 in | Wt 140.6 lb

## 2024-01-05 DIAGNOSIS — N761 Subacute and chronic vaginitis: Secondary | ICD-10-CM

## 2024-01-05 DIAGNOSIS — R7303 Prediabetes: Secondary | ICD-10-CM | POA: Diagnosis not present

## 2024-01-05 DIAGNOSIS — I10 Essential (primary) hypertension: Secondary | ICD-10-CM

## 2024-01-05 DIAGNOSIS — R350 Frequency of micturition: Secondary | ICD-10-CM | POA: Diagnosis not present

## 2024-01-05 LAB — POCT URINALYSIS DIPSTICK
Bilirubin, UA: NEGATIVE
Blood, UA: NEGATIVE
Glucose, UA: NEGATIVE
Ketones, UA: NEGATIVE
Leukocytes, UA: NEGATIVE
Nitrite, UA: NEGATIVE
Protein, UA: NEGATIVE
Spec Grav, UA: 1.015 (ref 1.010–1.025)
Urobilinogen, UA: 0.2 U/dL
pH, UA: 6.5 (ref 5.0–8.0)

## 2024-01-05 NOTE — Progress Notes (Signed)
 Date:  01/05/2024   Name:  Amy Gomez   DOB:  [birth year]   MRN:  [id number]   Chief Complaint: Urinary Tract Infection (Patient would like to be checked for STDs and UTI due to symptoms. Urinary Frequency, foul odor in urine, smelling sugar in urine. Patient also said she has vaginal itching on and off. )  Urinary Tract Infection  This is a recurrent problem. Associated symptoms include frequency. Pertinent negatives include no chills, hematuria or urgency. Associated symptoms comments: Urinary odor.  Vaginal Itching The patient's primary symptoms include genital itching and a genital odor. The patient's pertinent negatives include no genital lesions, pelvic pain or vaginal discharge. Associated symptoms include frequency. Pertinent negatives include no chills, fever, headaches, hematuria or urgency.  Hypertension This is a chronic problem. The problem is controlled. Pertinent negatives include no chest pain, headaches, palpitations or shortness of breath. Past treatments include ACE inhibitors.    Review of Systems  Constitutional:  Negative for chills, fatigue and fever.  Respiratory:  Negative for chest tightness and shortness of breath.   Cardiovascular:  Negative for chest pain and palpitations.  Genitourinary:  Positive for frequency. Negative for hematuria, pelvic pain, urgency and vaginal discharge.  Neurological:  Negative for dizziness and headaches.  Psychiatric/Behavioral:  Negative for dysphoric mood and sleep disturbance. The patient is not nervous/anxious.      Lab Results  Component Value Date   NA 135 05/08/2023   K 3.8 05/08/2023   CO2 26 05/08/2023   GLUCOSE 98 05/08/2023   BUN 11 05/08/2023   CREATININE 0.77 05/08/2023   CALCIUM 8.4 (L) 05/08/2023   GFRNONAA >60 05/08/2023   Lab Results  Component Value Date   CHOL 143 05/08/2023   HDL 40 (L) 05/08/2023   LDLCALC 93 05/08/2023   TRIG 52 05/08/2023   CHOLHDL 3.6 05/08/2023   Lab Results   Component Value Date   TSH 0.498 05/08/2023   Lab Results  Component Value Date   HGBA1C 5.9 (H) 05/08/2023   Lab Results  Component Value Date   WBC 12.1 (H) 05/08/2023   HGB 13.9 05/08/2023   HCT 40.1 05/08/2023   MCV 83.9 05/08/2023   PLT 240 05/08/2023   Lab Results  Component Value Date   ALT 19 05/08/2023   AST 20 05/08/2023   ALKPHOS 88 05/08/2023   BILITOT <0.1 (L) 05/08/2023   No results found for: "25OHVITD2", "25OHVITD3", "VD25OH"   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/11/2019   Pre-diabetes 01/26/2019   Liver mass 01/25/2019   Essential hypertension 07/15/2017   Gastroesophageal reflux disease without esophagitis 12/18/2016   History of kidney stones 06/04/2015   Dysmenorrhea 06/04/2015   Migraine without aura and without status migrainosus, not intractable 06/04/2015    Allergies  Allergen Reactions   Sulfa Antibiotics Anaphylaxis, Swelling, Rash and Other (See Comments)   Azithromycin Rash    Rash as a teenager, "Z Pack"    Past Surgical History:  Procedure Laterality Date   DILATION AND CURETTAGE OF UTERUS     TUBAL LIGATION Bilateral 2007    Social History  Tobacco Use   Smoking status: Former    Current packs/day: 0.00    Average packs/day: 0.5 packs/day for 28.6 years (14.3 ttl pk-yrs)    Types: Cigarettes    Start date: 45    Quit date: 06/11/2023    Years since quitting: 0.5   Smokeless tobacco: Never  Vaping Use   Vaping status: Former  Substance Use Topics   Alcohol use: No    Alcohol/week: 0.0  standard drinks of alcohol   Drug use: No     Medication list has been reviewed and updated.  Current Meds  Medication Sig   lisinopril (ZESTRIL) 30 MG tablet Take 1 tablet (30 mg total) by mouth daily.   naproxen (NAPROSYN) 500 MG tablet Take 1 tablet (500 mg total) by mouth 2 (two) times daily as needed (wrist pain).   rizatriptan (MAXALT) 10 MG tablet Take 1 tablet (10 mg total) by mouth as needed for migraine. May repeat in 2 hours if needed   traMADol (ULTRAM) 50 MG tablet Take 1 tablet (50 mg total) by mouth every 12 (twelve) hours as needed.       01/05/2024    9:45 AM 11/06/2023    1:18 PM 10/13/2023    8:56 AM 07/29/2023   11:29 AM  GAD 7 : Generalized Anxiety Score  Nervous, Anxious, on Edge 3 2 2  0  Control/stop worrying 3 2 2  0  Worry too much - different things 3 2 2  0  Trouble relaxing 0 0 0 0  Restless 0 0 0 0  Easily annoyed or irritable 0 0 0 0  Afraid - awful might happen 0 1 2 0  Total GAD 7 Score 9 7 8  0  Anxiety Difficulty Not difficult at all Not difficult at all Not difficult at all Not difficult at all       01/05/2024    9:45 AM 11/06/2023    1:18 PM 10/13/2023    8:56 AM  Depression screen PHQ 2/9  Decreased Interest 0 0 0  Down, Depressed, Hopeless 0 0 0  PHQ - 2 Score 0 0 0  Altered sleeping 0 0 0  Tired, decreased energy 0 0 0  Change in appetite 0 0 0  Feeling bad or failure about yourself  0 0 0  Trouble concentrating 0 0 0  Moving slowly or fidgety/restless 0 0 0  Suicidal thoughts 0 0 0  PHQ-9 Score 0 0 0  Difficult doing work/chores Not difficult at all Not difficult at all Not difficult at all    BP Readings from Last 3 Encounters:  01/05/24 124/78  10/21/23 125/85  10/13/23 124/70    Physical Exam Vitals and nursing note reviewed.  Constitutional:      General: She is not in acute distress.    Appearance: Normal appearance. She is well-developed.  HENT:     Head: Normocephalic and atraumatic.  Cardiovascular:     Rate  and Rhythm: Normal rate and regular rhythm.  Pulmonary:     Effort: Pulmonary effort is normal. No respiratory distress.     Breath sounds: No wheezing or rhonchi.  Abdominal:     General: Abdomen is flat.     Palpations: Abdomen is soft.     Tenderness: There is no abdominal tenderness.  Musculoskeletal:     Cervical back: Normal range of motion.  Skin:    General: Skin is warm and dry.     Findings: No rash.  Neurological:  Mental Status: She is alert and oriented to person, place, and time.  Psychiatric:        Mood and Affect: Mood normal.        Behavior: Behavior normal.   Urine dipstick shows negative for all components.  Micro exam: not done.   Wt Readings from Last 3 Encounters:  01/05/24 140 lb 9.6 oz (63.8 kg)  10/21/23 143 lb (64.9 kg)  10/13/23 143 lb (64.9 kg)    BP 124/78   Pulse 88   Ht 5' (1.524 m)   Wt 140 lb 9.6 oz (63.8 kg)   SpO2 100%   BMI 27.46 kg/m   Assessment and Plan:  Problem List Items Addressed This Visit       Unprioritized   Essential hypertension (Chronic)   Controlled BP with normal exam. Current regimen is lisinopril. Will continue same medications; encourage continued reduced sodium diet.       Pre-diabetes   UA negative for glucose today Cut back on sugary soda and juice and drink more water      Other Visit Diagnoses       Subacute vaginitis    -  Primary   Aptima swab obtained   Relevant Orders   Cervicovaginal ancillary only     Urinary frequency       cut back on fluids to 80 oz per day UA negative -   Relevant Orders   POCT urinalysis dipstick       Return in about 4 months (around 05/04/2024) for CPX.    Reubin Milan, MD [facility] [place]

## 2024-01-05 NOTE — Assessment & Plan Note (Signed)
 Controlled BP with normal exam. Current regimen is lisinopril. Will continue same medications; encourage continued reduced sodium diet.

## 2024-01-05 NOTE — Assessment & Plan Note (Signed)
 UA negative for glucose today Cut back on sugary soda and juice and drink more water

## 2024-01-06 ENCOUNTER — Other Ambulatory Visit: Payer: Self-pay | Admitting: Internal Medicine

## 2024-01-06 ENCOUNTER — Encounter: Payer: Self-pay | Admitting: Internal Medicine

## 2024-01-06 DIAGNOSIS — B9689 Other specified bacterial agents as the cause of diseases classified elsewhere: Secondary | ICD-10-CM

## 2024-01-06 DIAGNOSIS — B3731 Acute candidiasis of vulva and vagina: Secondary | ICD-10-CM

## 2024-01-06 LAB — CERVICOVAGINAL ANCILLARY ONLY
Bacterial Vaginitis (gardnerella): POSITIVE — AB
Candida Glabrata: NEGATIVE
Candida Vaginitis: POSITIVE — AB
Chlamydia: NEGATIVE
Comment: NEGATIVE
Comment: NEGATIVE
Comment: NEGATIVE
Comment: NEGATIVE
Comment: NEGATIVE
Comment: NORMAL
Neisseria Gonorrhea: NEGATIVE
Trichomonas: NEGATIVE

## 2024-01-06 MED ORDER — FLUCONAZOLE 100 MG PO TABS: 100.0000 mg | ORAL_TABLET | ORAL | 0 refills | Status: AC

## 2024-01-06 MED ORDER — METRONIDAZOLE 500 MG PO TABS: 500.0000 mg | ORAL_TABLET | Freq: Two times a day (BID) | ORAL | 0 refills | Status: AC

## 2024-01-11 ENCOUNTER — Other Ambulatory Visit: Payer: Self-pay | Admitting: Internal Medicine

## 2024-01-11 DIAGNOSIS — L719 Rosacea, unspecified: Secondary | ICD-10-CM

## 2024-01-12 NOTE — Telephone Encounter (Signed)
 Discontinued on 10/13/23 due to course completed, will refuse this request.  Requested Prescriptions  Pending Prescriptions Disp Refills   clindamycin (CLEOCIN T) 1 % external solution [Pharmacy Med Name: Clindamycin Phosphate 1 % External Solution] 30 mL 0    Sig: APPLY  SOLUTION TOPICALLY TWICE DAILY     Off-Protocol Failed - 01/12/2024  5:11 PM      Failed - Medication not assigned to a protocol, review manually.      Passed - Valid encounter within last 12 months    Recent Outpatient Visits           2 months ago Acute gastroenteritis   [facility] at [facility], [place] Al[place], [place]   3 months ago Migraine without aura and without status migrainosus, not intractable   [place] [facility] at [facility], Nyoka Cowden, [place]   4 months ago Vomiting without nausea, unspecified vomiting type   [facility] at [facility], Nyoka Cowden, [place]   5 months ago Acute non-recurrent maxillary sinusitis   [place] [facility] at [facility], Nyoka Cowden, [place]   6 months ago Essential hypertension   [facility] at [place] [facility], Nyoka Cowden, [place]       Future Appointments             In 3 months Judithann Graves, Nyoka Cowden, MD [facility] at [facility], [facility]

## 2024-01-18 ENCOUNTER — Encounter: Payer: Self-pay | Admitting: Student

## 2024-01-18 ENCOUNTER — Ambulatory Visit: Payer: Self-pay | Admitting: Internal Medicine

## 2024-01-18 ENCOUNTER — Ambulatory Visit: Payer: MEDICAID | Admitting: Student

## 2024-01-18 VITALS — BP 116/76 | HR 102 | Ht 60.0 in | Wt 142.1 lb

## 2024-01-18 DIAGNOSIS — G43009 Migraine without aura, not intractable, without status migrainosus: Secondary | ICD-10-CM | POA: Diagnosis not present

## 2024-01-18 DIAGNOSIS — I10 Essential (primary) hypertension: Secondary | ICD-10-CM

## 2024-01-18 MED ORDER — LISINOPRIL 30 MG PO TABS: 30.0000 mg | ORAL_TABLET | Freq: Every day | ORAL | 3 refills | Status: DC

## 2024-01-18 MED ORDER — TRAMADOL HCL 50 MG PO TABS: 50.0000 mg | ORAL_TABLET | Freq: Two times a day (BID) | ORAL | 0 refills | Status: DC | PRN

## 2024-01-18 MED ORDER — LISINOPRIL 30 MG PO TABS: 30.0000 mg | ORAL_TABLET | Freq: Every day | ORAL | 1 refills | Status: DC

## 2024-01-18 NOTE — Telephone Encounter (Signed)
  Chief Complaint: migraine  Symptoms: headache  Frequency: comes and goes   Disposition: [] ED /[] Urgent Care (no appt availability in office) / [x] Appointment(In office/virtual)/ []  [facility]/ [] Home Care/ [] Refused Recommended Disposition /[] [facility]/ []  Follow-up with PCP Additional Notes: Pt complaining of migraine since last night. Pt is out of medication and doesn't have money to refill whole script. Pt is asking for a couple tablets of Tramadol until she can find the money. Pt states pain is 6-8. Pt feels the headache is due to weather change/pollen. Pt has appt at 1320 today. RN gave care advice and pt verbalized understanding.              Copied from CRM [id number]. Topic: Clinical - Red Word Triage >> Jan 18, 2024 11:56 AM Fuller Mandril wrote: Red Word that prompted transfer to Nurse Triage: Migraine pain level 8/10 request refill tramadol Reason for Disposition  [1] SEVERE headache (e.g., excruciating) AND [2] not improved after 2 hours of pain medicine  Answer Assessment - Initial Assessment Questions 1. LOCATION: "Where does it hurt?"      Whole head 2. ONSET: "When did the headache start?" (Minutes, hours or days)      Last night  3. PATTERN: "Does the pain come and go, or has it been constant since it started?"     Comes and goes  4. SEVERITY: "How bad is the pain?" and "What does it keep you from doing?"  (e.g., Scale 1-10; mild, moderate, or severe)   - MILD (1-3): doesn't interfere with normal activities    - MODERATE (4-7): interferes with normal activities or awakens from sleep    - SEVERE (8-10): excruciating pain, unable to do any normal activities        6-7 5. RECURRENT SYMPTOM: "Have you ever had headaches before?" If Yes, ask: "When was the last time?" and "What happened that time?"      Yes; long time  6. CAUSE: "What do you think is causing the headache?"     Pollen  7. MIGRAINE: "Have you been diagnosed with migraine  headaches?" If Yes, ask: "Is this headache similar?"      yes 8. HEAD INJURY: "Has there been any recent injury to the head?"      na 9. OTHER SYMPTOMS: "Do you have any other symptoms?" (fever, stiff neck, eye pain, sore throat, cold symptoms)     Denies  Protocols used: Headache-A-AH

## 2024-01-18 NOTE — Assessment & Plan Note (Addendum)
 Reports migraine since yesterday. Reports she was taking nurtec/nortriptyline for preventive and imitrex for abortive which worked well, however unable to afford this at this time. She recently lost job and currently looking for a job. Tramadol is much more affordable for her at this time. Refilled Tramadol 50 mg #10 as needed for migraines. Encouraged her to follow up once income is more stable to restart her on her prior regimen.

## 2024-01-18 NOTE — Assessment & Plan Note (Signed)
 Controlled on lisinopril. States she is almost out. Continue current lisinopril, refill sent today.

## 2024-01-18 NOTE — Progress Notes (Signed)
 Established Patient Office Visit  Subjective   Patient ID: Amy Gomez, female    DOB: 03-22-1981  Age: 43 y.o. MRN: [id number]  Chief Complaint  Patient presents with   Migraine    Patient said she has had it since last night, has not gone away, can not afford naratriptan (due to recent job loss), requesting tramadol     Migraine  This is a recurrent problem. The current episode started yesterday. The problem has been gradually worsening. The pain is located in the Bilateral and temporal region. The quality of the pain is described as pulsating and aching. Pertinent negatives include no blurred vision, fever or vomiting.    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/11/2019   Pre-diabetes 01/26/2019   Liver mass 01/25/2019   Essential hypertension 07/15/2017   Gastroesophageal reflux disease without esophagitis 12/18/2016   History of kidney stones 06/04/2015   Dysmenorrhea 06/04/2015   Migraine without aura and without status migrainosus, not intractable 06/04/2015      Review of Systems  Constitutional:  Negative for fever.  Eyes:  Negative for blurred vision.  Gastrointestinal:  Negative for vomiting.   Refer to HPI    Objective:     BP 116/76   Pulse (!) 102   Ht 5' (1.524 m)   Wt 142 lb 2 oz (64.5 kg)   SpO2 96%   BMI 27.76 kg/m  BP Readings from Last 3 Encounters:  01/18/24 116/76  01/05/24 124/78  10/21/23 125/85    Physical Exam Constitutional:      Appearance: Normal appearance. She is not toxic-appearing.      Comments: Mildly uncomfortable  HENT:     Nose: No congestion or rhinorrhea.     Mouth/Throat:     Mouth: Mucous membranes are moist.     Pharynx: Oropharynx is clear.  Eyes:     Extraocular Movements: Extraocular movements intact.     Conjunctiva/sclera: Conjunctivae normal.     Pupils: Pupils are equal, round, and reactive to light.  Cardiovascular:     Rate and Rhythm: Normal rate and regular rhythm.  Pulmonary:     Effort: Pulmonary effort is normal.     Breath sounds: No rhonchi or rales.  Abdominal:     General: Abdomen is flat.     Palpations: Abdomen is soft.  Musculoskeletal:     Cervical back: Normal range of motion and neck supple.  Skin:    General: Skin is warm and dry.     Capillary Refill: Capillary refill takes less than 2 seconds.  Neurological:     General: No focal deficit present.     Mental Status: She is alert and oriented to person, place, and time. Mental status is at baseline.  Psychiatric:        Mood and Affect: Mood normal.        Behavior: Behavior normal.        01/18/2024    1:21 PM 01/05/2024    9:45 AM 11/06/2023    1:18 PM  Depression screen PHQ 2/9  Decreased Interest 0 0 0  Down, Depressed,  Hopeless 0 0 0  PHQ - 2 Score 0 0 0  Altered sleeping 0 0 0  Tired, decreased energy 0 0 0  Change in appetite 0 0 0  Feeling bad or failure about yourself  0 0 0  Trouble concentrating 0 0 0  Moving slowly or fidgety/restless 0 0 0  Suicidal thoughts 0 0 0  PHQ-9 Score 0 0 0  Difficult doing work/chores Not difficult at all Not difficult at all Not difficult at all       01/18/2024    1:21 PM 01/05/2024    9:45 AM 11/06/2023    1:18 PM 10/13/2023    8:56 AM  GAD 7 : Generalized Anxiety Score  Nervous, Anxious, on Edge 2 3 2 2   Control/stop worrying 2 3 2 2   Worry too much - different things 2 3 2 2   Trouble relaxing 0 0 0 0  Restless 0 0 0 0  Easily annoyed or irritable 0 0 0 0  Afraid - awful might happen 3 0 1 2  Total GAD 7 Score 9  9 7 8   Anxiety Difficulty  Not difficult at all Not difficult at all Not difficult at all    No results found for any visits on 01/18/24.     The 10-year ASCVD risk score (Arnett DK, et al., 2019) is: 0.6%    Assessment & Plan:  Migraine without aura and without status migrainosus, not intractable Assessment & Plan: Reports migraine since yesterday. Reports she was taking nurtec/nortriptyline for preventive and imitrex for abortive which worked well, however unable to afford this at this time. She recently lost job and currently looking for a job. Tramadol is much more affordable for her at this time. Refilled Tramadol 50 mg #10 as needed for migraines. Encouraged her to follow up once income is more stable to restart her on her prior regimen.   Orders: -     traMADol HCl; Take 1 tablet (50 mg total) by mouth every 12 (twelve) hours as needed for up to 10 days.  Dispense: 10 tablet; Refill: 0  Essential hypertension Assessment & Plan: Controlled on lisinopril. States she is almost out. Continue current lisinopril, refill sent today.   Orders: -     Lisinopril; Take 1 tablet (30 mg total) by mouth daily.  Dispense: 90 tablet; Refill: 3     Return in about 2 weeks (around 02/01/2024), or if symptoms worsen or fail to improve.    Quincy Simmonds, MD

## 2024-01-26 ENCOUNTER — Other Ambulatory Visit (HOSPITAL_COMMUNITY)
Admission: RE | Admit: 2024-01-26 | Discharge: 2024-01-26 | Disposition: A | Discharge status: Home / Self Care | Payer: MEDICAID | Source: Ambulatory Visit | Attending: Gastroenterology | Admitting: Gastroenterology

## 2024-01-26 ENCOUNTER — Ambulatory Visit (INDEPENDENT_AMBULATORY_CARE_PROVIDER_SITE_OTHER): Payer: MEDICAID | Admitting: Internal Medicine

## 2024-01-26 ENCOUNTER — Encounter: Payer: Self-pay | Admitting: Internal Medicine

## 2024-01-26 VITALS — BP 116/78 | HR 98 | Ht 60.0 in | Wt 137.2 lb

## 2024-01-26 DIAGNOSIS — N898 Other specified noninflammatory disorders of vagina: Secondary | ICD-10-CM | POA: Insufficient documentation

## 2024-01-26 DIAGNOSIS — K1370 Unspecified lesions of oral mucosa: Secondary | ICD-10-CM

## 2024-01-26 NOTE — Progress Notes (Signed)
 Date:  01/26/2024   Name:  Starlet Gallentine   DOB:  1981/10/18   MRN:  [id number]   Chief Complaint: Vaginal Itching (Patient said she has )  HPI She has mouth sores and noticed a lesion on her labia.  Her recent boyfriend says that he has HPV and has had it since a teenager.  She had a Pap several years ago with negative HPV but she is very concerned.  Of note, they severed their relationship last week, she has no where to live since her house has mold, she is struggling to find a job and her estranged husband is back in town.  Review of Systems  Constitutional:  Negative for chills, fatigue and fever.  HENT:  Positive for mouth sores.   Respiratory:  Negative for chest tightness and shortness of breath.   Cardiovascular:  Negative for chest pain.  Genitourinary:        Labial lesion on left     Lab Results  Component Value Date   NA 135 05/08/2023   K 3.8 05/08/2023   CO2 26 05/08/2023   GLUCOSE 98 05/08/2023   BUN 11 05/08/2023   CREATININE 0.77 05/08/2023   CALCIUM 8.4 (L) 05/08/2023   GFRNONAA >60 05/08/2023   Lab Results  Component Value Date   CHOL 143 05/08/2023   HDL 40 (L) 05/08/2023   LDLCALC 93 05/08/2023   TRIG 52 05/08/2023   CHOLHDL 3.6 05/08/2023   Lab Results  Component Value Date   TSH 0.498 05/08/2023   Lab Results  Component Value Date   HGBA1C 5.9 (H) 05/08/2023   Lab Results  Component Value Date   WBC 12.1 (H) 05/08/2023   HGB 13.9 05/08/2023   HCT 40.1 05/08/2023   MCV 83.9 05/08/2023   PLT 240 05/08/2023   Lab Results  Component Value Date   ALT 19 05/08/2023   AST 20 05/08/2023   ALKPHOS 88 05/08/2023   BILITOT <0.1 (L) 05/08/2023   No results found for: "25OHVITD2", "25OHVITD3", "VD25OH"   Patient Active Problem List   Diagnosis Date Noted   Thrombosed external hemorrhoid 05/26/2023   Excess body and facial hair 01/16/2023   Severe episode of recurrent major depressive disorder, without psychotic features (HCC)  04/11/2022   Recurrent periodic urticaria 12/09/2021   Left cervical radiculopathy 02/04/2021   Supraspinatus tendinitis, left 02/04/2021   Cervical paraspinal muscle spasm 02/04/2021   Rosacea 01/09/2020   Panic disorder with agoraphobia 07/11/2019   Pre-diabetes 01/26/2019   Liver mass 01/25/2019   Essential hypertension 07/15/2017   Gastroesophageal reflux disease without esophagitis 12/18/2016   History of kidney stones 06/04/2015   Dysmenorrhea 06/04/2015   Migraine without aura and without status migrainosus, not intractable 06/04/2015    Allergies  Allergen Reactions   Sulfa Antibiotics Anaphylaxis, Swelling, Rash and Other (See Comments)   Azithromycin Rash    Rash as a teenager, "Z Pack"    Past Surgical History:  Procedure Laterality Date   DILATION AND CURETTAGE OF UTERUS     TUBAL LIGATION Bilateral 2007    Social History   Tobacco Use   Smoking status: Former    Current packs/day: 0.00    Average packs/day: 0.5 packs/day for 28.6 years (14.3 ttl pk-yrs)    Types: Cigarettes    Start date: 89    Quit date: 06/11/2023    Years since quitting: 0.6   Smokeless tobacco: Never  Vaping Use   Vaping status: Former  Retail buyer  Topics   Alcohol use: No    Alcohol/week: 0.0 standard drinks of alcohol   Drug use: No     Medication list has been reviewed and updated.  Current Meds  Medication Sig   lisinopril (ZESTRIL) 30 MG tablet Take 1 tablet (30 mg total) by mouth daily.   naproxen (NAPROSYN) 500 MG tablet Take 1 tablet (500 mg total) by mouth 2 (two) times daily as needed (wrist pain).   rizatriptan (MAXALT) 10 MG tablet Take 1 tablet (10 mg total) by mouth as needed for migraine. May repeat in 2 hours if needed   traMADol (ULTRAM) 50 MG tablet Take 1 tablet (50 mg total) by mouth every 12 (twelve) hours as needed for up to 10 days.       01/26/2024    9:07 AM 01/18/2024    1:21 PM 01/05/2024    9:45 AM 11/06/2023    1:18 PM  GAD 7 : Generalized  Anxiety Score  Nervous, Anxious, on Edge 3 2 3 2   Control/stop worrying 3 2 3 2   Worry too much - different things 3 2 3 2   Trouble relaxing 3 0 0 0  Restless 3 0 0 0  Easily annoyed or irritable 3 0 0 0  Afraid - awful might happen 3 3 0 1  Total GAD 7 Score 21 9 9 7   Anxiety Difficulty Extremely difficult  Not difficult at all Not difficult at all       01/26/2024    9:07 AM 01/18/2024    1:21 PM 01/05/2024    9:45 AM  Depression screen PHQ 2/9  Decreased Interest 0 0 0  Down, Depressed, Hopeless 0 0 0  PHQ - 2 Score 0 0 0  Altered sleeping 0 0 0  Tired, decreased energy 0 0 0  Change in appetite 0 0 0  Feeling bad or failure about yourself  0 0 0  Trouble concentrating 0 0 0  Moving slowly or fidgety/restless 0 0 0  Suicidal thoughts 0 0 0  PHQ-9 Score 0 0 0  Difficult doing work/chores Not difficult at all Not difficult at all Not difficult at all    BP Readings from Last 3 Encounters:  01/26/24 116/78  01/18/24 116/76  01/05/24 124/78    Physical Exam Vitals and nursing note reviewed.  Constitutional:      General: She is not in acute distress.    Appearance: She is well-developed.  HENT:     Head: Normocephalic and atraumatic.     Mouth/Throat:      Comments: Prominent papillae Few small shallow ulcers on left buccal mucosa Cardiovascular:     Rate and Rhythm: Normal rate and regular rhythm.  Pulmonary:     Effort: Pulmonary effort is normal. No respiratory distress.     Breath sounds: No wheezing or rhonchi.  Genitourinary:    Labia:        Right: Lesion present.        Comments: Smooth pale subcutaneous lesion c/w inclusion cyst No other labial lesions are noted Skin:    General: Skin is warm and dry.     Findings: No rash.  Neurological:     Mental Status: She is alert and oriented to person, place, and time.  Psychiatric:        Mood and Affect: Mood normal.        Behavior: Behavior normal.     Wt Readings from Last 3 Encounters:   01/26/24 137 lb 4  oz (62.3 kg)  01/18/24 142 lb 2 oz (64.5 kg)  01/05/24 140 lb 9.6 oz (63.8 kg)    BP 116/78   Pulse 98   Ht 5' (1.524 m)   Wt 137 lb 4 oz (62.3 kg)   SpO2 98%   BMI 26.80 kg/m   Assessment and Plan:  Problem List Items Addressed This Visit   None Visit Diagnoses       Vaginal lesion    -  Primary   swabs for HPV obtained from the oral cavity and vagina lesion on labia is not characteristic of HPV   Relevant Orders   Cervicovaginal ancillary only     Mouth lesion       suspect aphthous ulcers related to multiple recent stressors       No follow-ups on file.    Reubin Milan, MD [facility] [place]

## 2024-01-27 ENCOUNTER — Encounter: Payer: Self-pay | Admitting: Internal Medicine

## 2024-01-27 DIAGNOSIS — N898 Other specified noninflammatory disorders of vagina: Secondary | ICD-10-CM | POA: Diagnosis present

## 2024-01-30 ENCOUNTER — Encounter: Payer: Self-pay | Admitting: Internal Medicine

## 2024-02-01 ENCOUNTER — Encounter: Payer: Self-pay | Admitting: Internal Medicine

## 2024-02-01 LAB — CERVICOVAGINAL ANCILLARY ONLY
Comment: NEGATIVE
High risk HPV: NEGATIVE

## 2024-02-01 NOTE — Telephone Encounter (Signed)
 Requesting lab results from 01/26/2024.

## 2024-02-01 NOTE — Telephone Encounter (Signed)
 Please review. Appt?  KP

## 2024-02-01 NOTE — Telephone Encounter (Signed)
 PT response. Please advise.  JM

## 2024-02-02 ENCOUNTER — Encounter: Payer: Self-pay | Admitting: Internal Medicine

## 2024-02-02 NOTE — Telephone Encounter (Signed)
 Please review and advise.   JM

## 2024-02-15 ENCOUNTER — Other Ambulatory Visit: Payer: Self-pay | Admitting: Student

## 2024-02-15 DIAGNOSIS — G43009 Migraine without aura, not intractable, without status migrainosus: Secondary | ICD-10-CM

## 2024-02-16 NOTE — Telephone Encounter (Signed)
 Requested medications are due for refill today.  no  Requested medications are on the active medications list.  no  Last refill. 01/18/2024   Future visit scheduled.   yes  Notes to clinic.  Refill/refusal not delegated.    Requested Prescriptions  Pending Prescriptions Disp Refills   traMADol (ULTRAM) 50 MG tablet [Pharmacy Med Name: traMADol HCl 50 MG Oral Tablet] 10 tablet 0    Sig: TAKE 1 TABLET BY MOUTH EVERY 12 HOURS AS NEEDED FOR  UP  TO  10  DAYS     Not Delegated - Analgesics:  Opioid Agonists Failed - 02/16/2024  2:50 PM      Failed - This refill cannot be delegated      Failed - Urine Drug Screen completed in last 360 days      Passed - Valid encounter within last 3 months    Recent Outpatient Visits           3 weeks ago Vaginal lesion   [place] [facility] at [facility], Nyoka Cowden, MD   4 weeks ago Migraine without aura and without status migrainosus, not intractable   [place] [facility] at [facility], MD   1 month ago Subacute vaginitis   [facility] at [facility], Nyoka Cowden, MD       Future Appointments             In 2 months Judithann Graves, Nyoka Cowden, MD [facility] at [facility], [facility]

## 2024-02-16 NOTE — Telephone Encounter (Signed)
 Please review.  KP

## 2024-02-17 ENCOUNTER — Telehealth: Payer: Self-pay

## 2024-02-17 NOTE — Telephone Encounter (Signed)
 PA complete for traMADol HCl 50MG  tablets on [email or web address]  (Key: [id number]) PA Case ID #: [id number] Rx #: [id number]  Awaiting outcome.

## 2024-02-18 NOTE — Telephone Encounter (Signed)
 PA approved  Authorization Expiration Date: 08/18/2024

## 2024-02-24 ENCOUNTER — Ambulatory Visit: Payer: MEDICAID | Admitting: Internal Medicine

## 2024-03-14 ENCOUNTER — Encounter: Payer: Self-pay | Admitting: Internal Medicine

## 2024-03-14 ENCOUNTER — Ambulatory Visit (INDEPENDENT_AMBULATORY_CARE_PROVIDER_SITE_OTHER): Payer: MEDICAID | Admitting: Internal Medicine

## 2024-03-14 VITALS — BP 108/66 | HR 84 | Ht 60.0 in | Wt 143.1 lb

## 2024-03-14 DIAGNOSIS — F332 Major depressive disorder, recurrent severe without psychotic features: Secondary | ICD-10-CM | POA: Diagnosis not present

## 2024-03-14 DIAGNOSIS — F5101 Primary insomnia: Secondary | ICD-10-CM | POA: Diagnosis not present

## 2024-03-14 MED ORDER — TRAZODONE HCL 50 MG PO TABS: 50.0000 mg | ORAL_TABLET | Freq: Every evening | ORAL | 0 refills | Status: DC | PRN

## 2024-03-14 MED ORDER — SERTRALINE HCL 50 MG PO TABS: 50.0000 mg | ORAL_TABLET | Freq: Every day | ORAL | 0 refills | Status: DC

## 2024-03-14 NOTE — Assessment & Plan Note (Signed)
 Worsening symptoms since breakup a month ago. She is now working but had to come home yesterday due to uncontrollable crying She is living in a camper since her house has black mold  Struggling with family relationships and feels alone She is not suicidal -has no plan and does not want to die. Will start Sertraline  50 mg in AM and trazodone 50-100 mg a HS Will not give any controlled medications, sleeping agents or pain medication at this time Suicide and crisis numbers provided. Refer to [facility].

## 2024-03-14 NOTE — Progress Notes (Signed)
 Date:  03/14/2024   Name:  Amy Gomez   DOB:  Sep 23, 1981   MRN:  [id number]   Chief Complaint: Depression (Patient said she is struggling with depression and suicidal thoughts ) and Insomnia (Patient said she does not sleep, stays up all night, may only sleep 4 hours in a day)  Depression        This is a chronic problem.  The problem occurs constantly.  The problem has been gradually worsening since onset.  Associated symptoms include insomnia, irritable, decreased interest, sad and suicidal ideas.  Associated symptoms include no appetite change.     The symptoms are aggravated by family issues and social issues.  Treatments tried: has been on multiple agents in the past.   Review of Systems  Constitutional:  Negative for appetite change, fever and unexpected weight change.  Respiratory:  Negative for chest tightness and shortness of breath.   Cardiovascular:  Negative for chest pain.  Psychiatric/Behavioral:  Positive for depression, dysphoric mood, sleep disturbance and suicidal ideas. The patient is nervous/anxious and has insomnia.      Lab Results  Component Value Date   NA 135 05/08/2023   K 3.8 05/08/2023   CO2 26 05/08/2023   GLUCOSE 98 05/08/2023   BUN 11 05/08/2023   CREATININE 0.77 05/08/2023   CALCIUM  8.4 (L) 05/08/2023   GFRNONAA >60 05/08/2023   Lab Results  Component Value Date   CHOL 143 05/08/2023   HDL 40 (L) 05/08/2023   LDLCALC 93 05/08/2023   TRIG 52 05/08/2023   CHOLHDL 3.6 05/08/2023   Lab Results  Component Value Date   TSH 0.498 05/08/2023   Lab Results  Component Value Date   HGBA1C 5.9 (H) 05/08/2023   Lab Results  Component Value Date   WBC 12.1 (H) 05/08/2023   HGB 13.9 05/08/2023   HCT 40.1 05/08/2023   MCV 83.9 05/08/2023   PLT 240 05/08/2023   Lab Results  Component Value Date   ALT 19 05/08/2023   AST 20 05/08/2023   ALKPHOS 88 05/08/2023   BILITOT <0.1 (L) 05/08/2023   No results found for: "25OHVITD2",  "25OHVITD3", "VD25OH"   Patient Active Problem List   Diagnosis Date Noted   Primary insomnia 03/14/2024   Thrombosed external hemorrhoid 05/26/2023   Excess body and facial hair 01/16/2023   Severe episode of recurrent major depressive disorder, without psychotic features (HCC) 04/11/2022   Recurrent periodic urticaria 12/09/2021   Left cervical radiculopathy 02/04/2021   Supraspinatus tendinitis, left 02/04/2021   Cervical paraspinal muscle spasm 02/04/2021   Rosacea 01/09/2020   Panic disorder with agoraphobia 07/11/2019   Pre-diabetes 01/26/2019   Liver mass 01/25/2019   Essential hypertension 07/15/2017   Gastroesophageal reflux disease without esophagitis 12/18/2016   History of kidney stones 06/04/2015   Dysmenorrhea 06/04/2015   Migraine without aura and without status migrainosus, not intractable 06/04/2015    Allergies  Allergen Reactions   Sulfa  Antibiotics Anaphylaxis, Swelling, Rash and Other (See Comments)   Azithromycin  Rash    Rash as a teenager, "Z Pack"    Past Surgical History:  Procedure Laterality Date   DILATION AND CURETTAGE OF UTERUS     TUBAL LIGATION Bilateral 2007    Social History   Tobacco Use   Smoking status: Former    Current packs/day: 0.00    Average packs/day: 0.5 packs/day for 28.6 years (14.3 ttl pk-yrs)    Types: Cigarettes    Start date: 1996  Quit date: 06/11/2023    Years since quitting: 0.7   Smokeless tobacco: Never  Vaping Use   Vaping status: Former  Substance Use Topics   Alcohol use: No    Alcohol/week: 0.0 standard drinks of alcohol   Drug use: No     Medication list has been reviewed and updated.  Current Meds  Medication Sig   lisinopril  (ZESTRIL ) 30 MG tablet Take 1 tablet (30 mg total) by mouth daily.   sertraline  (ZOLOFT ) 50 MG tablet Take 1 tablet (50 mg total) by mouth daily.   traZODone (DESYREL) 50 MG tablet Take 1-2 tablets (50-100 mg total) by mouth at bedtime as needed for sleep.    [DISCONTINUED] traMADol  (ULTRAM ) 50 MG tablet Take 1 tablet (50 mg total) by mouth every other day as needed.       03/14/2024   10:42 AM 01/26/2024    9:07 AM 01/18/2024    1:21 PM 01/05/2024    9:45 AM  GAD 7 : Generalized Anxiety Score  Nervous, Anxious, on Edge 3 3 2 3   Control/stop worrying 3 3 2 3   Worry too much - different things 3 3 2 3   Trouble relaxing 3 3 0 0  Restless 3 3 0 0  Easily annoyed or irritable 3 3 0 0  Afraid - awful might happen 3 3 3  0  Total GAD 7 Score 21 21 9 9   Anxiety Difficulty Extremely difficult Extremely difficult  Not difficult at all       03/14/2024   10:41 AM 01/26/2024    9:07 AM 01/18/2024    1:21 PM  Depression screen PHQ 2/9  Decreased Interest 3 0 0  Down, Depressed, Hopeless 3 0 0  PHQ - 2 Score 6 0 0  Altered sleeping 3 0 0  Tired, decreased energy 3 0 0  Change in appetite 3 0 0  Feeling bad or failure about yourself  3 0 0  Trouble concentrating 3 0 0  Moving slowly or fidgety/restless 3 0 0  Suicidal thoughts  0 0  PHQ-9 Score 24 0 0  Difficult doing work/chores Extremely dIfficult Not difficult at all Not difficult at all    BP Readings from Last 3 Encounters:  03/14/24 108/66  01/26/24 116/78  01/18/24 116/76    Physical Exam Vitals and nursing note reviewed.  Constitutional:      General: She is irritable. She is not in acute distress.    Appearance: Normal appearance. She is well-developed.  HENT:     Head: Normocephalic and atraumatic.  Pulmonary:     Effort: Pulmonary effort is normal. No respiratory distress.  Skin:    General: Skin is warm and dry.     Findings: No rash.  Neurological:     Mental Status: She is alert and oriented to person, place, and time.  Psychiatric:        Attention and Perception: Attention normal.        Mood and Affect: Mood normal. Affect is tearful.        Speech: Speech normal.        Behavior: Behavior normal.        Thought Content: Thought content does not include suicidal  ideation. Thought content does not include suicidal plan.        Cognition and Memory: Cognition normal.        Judgment: Judgment normal.     Wt Readings from Last 3 Encounters:  03/14/24 143 lb 2 oz (64.9  kg)  01/26/24 137 lb 4 oz (62.3 kg)  01/18/24 142 lb 2 oz (64.5 kg)    BP 108/66   Pulse 84   Ht 5' (1.524 m)   Wt 143 lb 2 oz (64.9 kg)   SpO2 98%   BMI 27.95 kg/m   Assessment and Plan:  Problem List Items Addressed This Visit       Unprioritized   Severe episode of recurrent major depressive disorder, without psychotic features (HCC) (Chronic)   Worsening symptoms since breakup a month ago. She is now working but had to come home yesterday due to uncontrollable crying She is living in a camper since her house has black mold  Struggling with family relationships and feels alone She is not suicidal -has no plan and does not want to die. Will start Sertraline  50 mg in AM and trazodone 50-100 mg a HS Will not give any controlled medications, sleeping agents or pain medication at this time Suicide and crisis numbers provided. Refer to [facility].      Relevant Medications   sertraline  (ZOLOFT ) 50 MG tablet   traZODone (DESYREL) 50 MG tablet   Other Relevant Orders   Ambulatory referral to Psychiatry   Primary insomnia - Primary   Relevant Medications   traZODone (DESYREL) 50 MG tablet    No follow-ups on file.    Sheron Dixons, MD [facility] [place]

## 2024-03-24 ENCOUNTER — Other Ambulatory Visit: Payer: Self-pay | Admitting: Internal Medicine

## 2024-03-24 ENCOUNTER — Encounter: Payer: Self-pay | Admitting: Internal Medicine

## 2024-03-24 DIAGNOSIS — G43009 Migraine without aura, not intractable, without status migrainosus: Secondary | ICD-10-CM

## 2024-03-24 MED ORDER — TRAMADOL HCL 50 MG PO TABS: 50.0000 mg | ORAL_TABLET | Freq: Two times a day (BID) | ORAL | 0 refills | Status: DC | PRN
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
## 2024-03-24 NOTE — Telephone Encounter (Signed)
 Please review and advise.

## 2024-04-10 ENCOUNTER — Other Ambulatory Visit: Payer: Self-pay | Admitting: Internal Medicine

## 2024-04-10 DIAGNOSIS — F332 Major depressive disorder, recurrent severe without psychotic features: Secondary | ICD-10-CM

## 2024-04-12 NOTE — Telephone Encounter (Signed)
 Requested Prescriptions  Pending Prescriptions Disp Refills   sertraline  (ZOLOFT ) 50 MG tablet [Pharmacy Med Name: Sertraline  HCl 50 MG Oral Tablet] 90 tablet 0    Sig: Take 1 tablet by mouth once daily     Psychiatry:  Antidepressants - SSRI - sertraline  Passed - 04/12/2024  9:11 AM      Passed - AST in normal range and within 360 days    AST  Date Value Ref Range Status  05/08/2023 20 15 - 41 U/L Final   SGOT(AST)  Date Value Ref Range Status  03/03/2015 23 U/L Final    Comment:    15-41 NOTE: New Reference Range  01/16/15          Passed - ALT in normal range and within 360 days    ALT  Date Value Ref Range Status  05/08/2023 19 0 - 44 U/L Final   SGPT (ALT)  Date Value Ref Range Status  03/03/2015 16 U/L Final    Comment:    14-54 NOTE: New Reference Range  01/16/15          Passed - Completed PHQ-2 or PHQ-9 in the last 360 days      Passed - Valid encounter within last 6 months    Recent Outpatient Visits           4 weeks ago Primary insomnia   [place] [facility] at [facility], Chales [place], MD   2 months ago Vaginal lesion   [facility] at [facility], Chales [place], MD   2 months ago Migraine without aura and without status migrainosus, not intractable   [place] [facility] at [facility], MD   3 months ago Subacute vaginitis   [facility] at [facility], Chales [place], MD       Future Appointments             In 3 weeks Gala Jubilee Ch[place], MD [facility] Medicine at [facility], [facility]

## 2024-04-22 ENCOUNTER — Other Ambulatory Visit: Payer: Self-pay | Admitting: Internal Medicine

## 2024-04-22 DIAGNOSIS — G43009 Migraine without aura, not intractable, without status migrainosus: Secondary | ICD-10-CM

## 2024-04-25 NOTE — Telephone Encounter (Signed)
 Requested medications are due for refill today.  yes  Requested medications are on the active medications list.  yes  Last refill. 03/24/2024 #10 0 rf  Future visit scheduled.   yes  Notes to clinic.  Refill not delegated.    Requested Prescriptions  Pending Prescriptions Disp Refills   traMADol  (ULTRAM ) 50 MG tablet [Pharmacy Med Name: traMADol  HCl 50 MG Oral Tablet] 10 tablet 0    Sig: TAKE 1 TABLET BY MOUTH EVERY 12 HOURS AS NEEDED     Not Delegated - Analgesics:  Opioid Agonists Failed - 04/25/2024  4:06 PM      Failed - This refill cannot be delegated      Failed - Urine Drug Screen completed in last 360 days      Failed - Valid encounter within last 3 months    Recent Outpatient Visits           1 month ago Primary insomnia   [place] [facility] at [facility], Chales [place], MD   3 months ago Vaginal lesion   [facility] at [facility], Chales [place], MD   3 months ago Migraine without aura and without status migrainosus, not intractable   [place] [facility] at [facility], MD   3 months ago Subacute vaginitis   [facility] at [facility], Chales [place], MD       Future Appointments             In 1 week Gala Jubilee, [place], MD [facility] Primary Care [facility] Medicine at [facility], [facility]

## 2024-04-25 NOTE — Telephone Encounter (Signed)
 Please review.  KP

## 2024-05-04 ENCOUNTER — Encounter: Payer: MEDICAID | Admitting: Internal Medicine

## 2024-05-13 ENCOUNTER — Encounter: Payer: Self-pay | Admitting: Internal Medicine

## 2024-06-02 ENCOUNTER — Other Ambulatory Visit: Payer: Self-pay | Admitting: Internal Medicine

## 2024-06-02 DIAGNOSIS — G43009 Migraine without aura, not intractable, without status migrainosus: Secondary | ICD-10-CM

## 2024-06-03 NOTE — Telephone Encounter (Signed)
 Requested medication (s) are due for refill today: Yes  Requested medication (s) are on the active medication list: Yes  Last refill:  04/25/24  Future visit scheduled: No  Notes to clinic:  Not delegated.    Requested Prescriptions  Pending Prescriptions Disp Refills   traMADol  (ULTRAM ) 50 MG tablet [Pharmacy Med Name: traMADol  HCl 50 MG Oral Tablet] 10 tablet 0    Sig: TAKE 1 TABLET BY MOUTH EVERY 12 HOURS AS NEEDED     Not Delegated - Analgesics:  Opioid Agonists Failed - 06/03/2024  3:09 PM      Failed - This refill cannot be delegated      Failed - Urine Drug Screen completed in last 360 days      Passed - Valid encounter within last 3 months    Recent Outpatient Visits           2 months ago Primary insomnia   [place] [facility] at [facility], Leita DEL, MD   4 months ago Vaginal lesion   [facility] at [facility], Leita DEL, MD   4 months ago Migraine without aura and without status migrainosus, not intractable   [place] [facility] at [facility], MD   5 months ago Subacute vaginitis   [facility] at [facility], Leita DEL, MD

## 2024-06-03 NOTE — Telephone Encounter (Signed)
 Please review.  KP

## 2024-06-15 ENCOUNTER — Encounter: Payer: Self-pay | Admitting: Psychiatry

## 2024-06-15 ENCOUNTER — Emergency Department
Admission: EM | Admit: 2024-06-15 | Discharge: 2024-06-15 | Disposition: A | Discharge status: Other Acute Inpatient Hospital | Payer: MEDICAID | Attending: Emergency Medicine | Admitting: Emergency Medicine

## 2024-06-15 ENCOUNTER — Other Ambulatory Visit: Payer: Self-pay

## 2024-06-15 ENCOUNTER — Inpatient Hospital Stay
Admission: AD | Admit: 2024-06-15 | Discharge: 2024-06-20 | DRG: 885 | Disposition: A | Discharge status: Home / Self Care | Payer: MEDICAID | Source: Intra-hospital | Attending: Psychiatry | Admitting: Psychiatry

## 2024-06-15 ENCOUNTER — Encounter: Payer: Self-pay | Admitting: Emergency Medicine

## 2024-06-15 DIAGNOSIS — Z630 Problems in relationship with spouse or partner: Secondary | ICD-10-CM

## 2024-06-15 DIAGNOSIS — Z881 Allergy status to other antibiotic agents status: Secondary | ICD-10-CM

## 2024-06-15 DIAGNOSIS — Z87442 Personal history of urinary calculi: Secondary | ICD-10-CM | POA: Diagnosis not present

## 2024-06-15 DIAGNOSIS — F3181 Bipolar II disorder: Secondary | ICD-10-CM | POA: Diagnosis present

## 2024-06-15 DIAGNOSIS — E876 Hypokalemia: Secondary | ICD-10-CM | POA: Diagnosis not present

## 2024-06-15 DIAGNOSIS — Z87891 Personal history of nicotine dependence: Secondary | ICD-10-CM | POA: Diagnosis not present

## 2024-06-15 DIAGNOSIS — Z882 Allergy status to sulfonamides status: Secondary | ICD-10-CM

## 2024-06-15 DIAGNOSIS — Z91411 Personal history of adult psychological abuse: Secondary | ICD-10-CM | POA: Diagnosis not present

## 2024-06-15 DIAGNOSIS — Z91410 Personal history of adult physical and sexual abuse: Secondary | ICD-10-CM | POA: Diagnosis not present

## 2024-06-15 DIAGNOSIS — T50902A Poisoning by unspecified drugs, medicaments and biological substances, intentional self-harm, initial encounter: Secondary | ICD-10-CM

## 2024-06-15 DIAGNOSIS — Z635 Disruption of family by separation and divorce: Secondary | ICD-10-CM

## 2024-06-15 DIAGNOSIS — Z5982 Transportation insecurity: Secondary | ICD-10-CM

## 2024-06-15 DIAGNOSIS — I10 Essential (primary) hypertension: Secondary | ICD-10-CM | POA: Diagnosis present

## 2024-06-15 DIAGNOSIS — T39392A Poisoning by other nonsteroidal anti-inflammatory drugs [NSAID], intentional self-harm, initial encounter: Secondary | ICD-10-CM | POA: Diagnosis present

## 2024-06-15 DIAGNOSIS — Z9851 Tubal ligation status: Secondary | ICD-10-CM | POA: Diagnosis not present

## 2024-06-15 DIAGNOSIS — F419 Anxiety disorder, unspecified: Secondary | ICD-10-CM | POA: Diagnosis present

## 2024-06-15 DIAGNOSIS — Z79899 Other long term (current) drug therapy: Secondary | ICD-10-CM

## 2024-06-15 LAB — URINALYSIS, ROUTINE W REFLEX MICROSCOPIC
Bilirubin Urine: NEGATIVE
Glucose, UA: NEGATIVE mg/dL
Hgb urine dipstick: NEGATIVE
Ketones, ur: 5 mg/dL — AB
Leukocytes,Ua: NEGATIVE
Nitrite: NEGATIVE
Protein, ur: NEGATIVE mg/dL
Specific Gravity, Urine: 1.002 — ABNORMAL LOW (ref 1.005–1.030)
pH: 7 (ref 5.0–8.0)

## 2024-06-15 LAB — COMPREHENSIVE METABOLIC PANEL WITH GFR
ALT: 16 U/L (ref 0–44)
ALT: 15 U/L (ref 0–44)
ALT: 15 U/L (ref 0–44)
AST: 23 U/L (ref 15–41)
AST: 22 U/L (ref 15–41)
AST: 19 U/L (ref 15–41)
Albumin: 3.9 g/dL (ref 3.5–5.0)
Albumin: 3.4 g/dL — ABNORMAL LOW (ref 3.5–5.0)
Albumin: 3.3 g/dL — ABNORMAL LOW (ref 3.5–5.0)
Alkaline Phosphatase: 62 U/L (ref 38–126)
Alkaline Phosphatase: 52 U/L (ref 38–126)
Alkaline Phosphatase: 51 U/L (ref 38–126)
Anion gap: 12 (ref 5–15)
Anion gap: 11 (ref 5–15)
Anion gap: 12 (ref 5–15)
BUN: 7 mg/dL (ref 6–20)
BUN: 5 mg/dL — ABNORMAL LOW (ref 6–20)
BUN: <5 mg/dL — ABNORMAL LOW (ref 6–20)
CO2: 26 mmol/L (ref 22–32)
CO2: 22 mmol/L (ref 22–32)
CO2: 22 mmol/L (ref 22–32)
Calcium: 9 mg/dL (ref 8.9–10.3)
Calcium: 8.5 mg/dL — ABNORMAL LOW (ref 8.9–10.3)
Calcium: 8.4 mg/dL — ABNORMAL LOW (ref 8.9–10.3)
Chloride: 104 mmol/L (ref 98–111)
Chloride: 107 mmol/L (ref 98–111)
Chloride: 106 mmol/L (ref 98–111)
Creatinine, Ser: 0.64 mg/dL (ref 0.44–1.00)
Creatinine, Ser: 0.63 mg/dL (ref 0.44–1.00)
Creatinine, Ser: 0.56 mg/dL (ref 0.44–1.00)
GFR, Estimated: >60 mL/min (ref >60)
GFR, Estimated: >60 mL/min (ref >60)
GFR, Estimated: >60 mL/min (ref >60)
Glucose, Bld: 106 mg/dL — ABNORMAL HIGH (ref 70–99)
Glucose, Bld: 110 mg/dL — ABNORMAL HIGH (ref 70–99)
Glucose, Bld: 107 mg/dL — ABNORMAL HIGH (ref 70–99)
Potassium: 3.2 mmol/L — ABNORMAL LOW (ref 3.5–5.1)
Potassium: 3.3 mmol/L — ABNORMAL LOW (ref 3.5–5.1)
Potassium: 3.4 mmol/L — ABNORMAL LOW (ref 3.5–5.1)
Sodium: 142 mmol/L (ref 135–145)
Sodium: 140 mmol/L (ref 135–145)
Sodium: 140 mmol/L (ref 135–145)
Total Bilirubin: 0.8 mg/dL (ref 0.0–1.2)
Total Bilirubin: 0.6 mg/dL (ref 0.0–1.2)
Total Bilirubin: 0.6 mg/dL (ref 0.0–1.2)
Total Protein: 6.9 g/dL (ref 6.5–8.1)
Total Protein: 6 g/dL — ABNORMAL LOW (ref 6.5–8.1)
Total Protein: 5.9 g/dL — ABNORMAL LOW (ref 6.5–8.1)

## 2024-06-15 LAB — MAGNESIUM: Magnesium: 1.9 mg/dL (ref 1.7–2.4)

## 2024-06-15 LAB — CBC WITH DIFFERENTIAL/PLATELET
Abs Immature Granulocytes: 0.04 K/uL (ref 0.00–0.07)
Basophils Absolute: 0.1 K/uL (ref 0.0–0.1)
Basophils Relative: 1 %
Eosinophils Absolute: 0.2 K/uL (ref 0.0–0.5)
Eosinophils Relative: 2 %
HCT: 39.9 % (ref 36.0–46.0)
Hemoglobin: 13.6 g/dL (ref 12.0–15.0)
Immature Granulocytes: 0 %
Lymphocytes Relative: 32 %
Lymphs Abs: 3.5 K/uL (ref 0.7–4.0)
MCH: 29.4 pg (ref 26.0–34.0)
MCHC: 34.1 g/dL (ref 30.0–36.0)
MCV: 86.2 fL (ref 80.0–100.0)
Monocytes Absolute: 0.9 K/uL (ref 0.1–1.0)
Monocytes Relative: 8 %
Neutro Abs: 6.3 K/uL (ref 1.7–7.7)
Neutrophils Relative %: 57 %
Platelets: 248 K/uL (ref 150–400)
RBC: 4.63 MIL/uL (ref 3.87–5.11)
RDW: 12.8 % (ref 11.5–15.5)
WBC: 11 K/uL — ABNORMAL HIGH (ref 4.0–10.5)
nRBC: 0 % (ref 0.0–0.2)

## 2024-06-15 LAB — URINE DRUG SCREEN, QUALITATIVE (ARMC ONLY)
Amphetamines, Ur Screen: POSITIVE — AB
Barbiturates, Ur Screen: NOT DETECTED
Benzodiazepine, Ur Scrn: NOT DETECTED
Cannabinoid 50 Ng, Ur ~~LOC~~: NOT DETECTED
Cocaine Metabolite,Ur ~~LOC~~: NOT DETECTED
MDMA (Ecstasy)Ur Screen: NOT DETECTED
Methadone Scn, Ur: NOT DETECTED
Opiate, Ur Screen: NOT DETECTED
Phencyclidine (PCP) Ur S: NOT DETECTED
Tricyclic, Ur Screen: NOT DETECTED

## 2024-06-15 LAB — SALICYLATE LEVEL
Salicylate Lvl: 13.1 mg/dL (ref 7.0–30.0)
Salicylate Lvl: 17.6 mg/dL (ref 7.0–30.0)
Salicylate Lvl: 13 mg/dL (ref 7.0–30.0)

## 2024-06-15 LAB — PREGNANCY, URINE: Preg Test, Ur: NEGATIVE

## 2024-06-15 LAB — ACETAMINOPHEN LEVEL
Acetaminophen (Tylenol), Serum: 63 ug/mL — ABNORMAL HIGH (ref 10–30)
Acetaminophen (Tylenol), Serum: 54 ug/mL — ABNORMAL HIGH (ref 10–30)
Acetaminophen (Tylenol), Serum: 25 ug/mL (ref 10–30)

## 2024-06-15 LAB — ETHANOL: Alcohol, Ethyl (B): <15 mg/dL (ref <15)

## 2024-06-15 LAB — CBG MONITORING, ED: Glucose-Capillary: 102 mg/dL — ABNORMAL HIGH (ref 70–99)

## 2024-06-15 MED ORDER — HYDROXYZINE HCL 25 MG PO TABS: 25.0000 mg | ORAL_TABLET | Freq: Four times a day (QID) | ORAL | Status: DC | PRN

## 2024-06-15 MED ORDER — NICOTINE POLACRILEX 2 MG MT GUM
2.0000 mg | CHEWING_GUM | OROMUCOSAL | Status: DC | PRN
  Administered 2024-06-18 – 2024-06-20 (×10): 2 mg via ORAL
  Filled 2024-06-15 (×10): qty 1

## 2024-06-15 MED ORDER — POTASSIUM CHLORIDE 10 MEQ/100ML IV SOLN
10.0000 meq | INTRAVENOUS | Status: AC
  Administered 2024-06-15 (×2): 10 meq via INTRAVENOUS
  Filled 2024-06-15 (×2): qty 100

## 2024-06-15 MED ORDER — ALUM & MAG HYDROXIDE-SIMETH 200-200-20 MG/5ML PO SUSP: 30.0000 mL | ORAL | Status: DC | PRN

## 2024-06-15 MED ORDER — HALOPERIDOL 5 MG PO TABS: 5.0000 mg | ORAL_TABLET | Freq: Three times a day (TID) | ORAL | Status: DC | PRN

## 2024-06-15 MED ORDER — DIPHENHYDRAMINE HCL 50 MG/ML IJ SOLN
50.0000 mg | Freq: Three times a day (TID) | INTRAMUSCULAR | Status: DC | PRN
  Administered 2024-06-16: 50 mg via INTRAMUSCULAR
  Filled 2024-06-15: qty 1

## 2024-06-15 MED ORDER — LORAZEPAM 2 MG/ML IJ SOLN
2.0000 mg | Freq: Three times a day (TID) | INTRAMUSCULAR | Status: DC | PRN
  Administered 2024-06-16: 2 mg via INTRAMUSCULAR
  Filled 2024-06-15: qty 1

## 2024-06-15 MED ORDER — ACETAMINOPHEN 325 MG PO TABS: 650.0000 mg | ORAL_TABLET | Freq: Four times a day (QID) | ORAL | Status: DC | PRN

## 2024-06-15 MED ORDER — MAGNESIUM HYDROXIDE 400 MG/5ML PO SUSP: 30.0000 mL | Freq: Every day | ORAL | Status: DC | PRN

## 2024-06-15 MED ORDER — POTASSIUM CHLORIDE CRYS ER 20 MEQ PO TBCR
40.0000 meq | EXTENDED_RELEASE_TABLET | Freq: Once | ORAL | Status: AC
  Administered 2024-06-15: 40 meq via ORAL
  Filled 2024-06-15: qty 2

## 2024-06-15 MED ORDER — LISINOPRIL 5 MG PO TABS: 30.0000 mg | ORAL_TABLET | Freq: Every day | ORAL | Status: DC

## 2024-06-15 MED ORDER — NICOTINE 21 MG/24HR TD PT24: 21.0000 mg | MEDICATED_PATCH | Freq: Every day | TRANSDERMAL | Status: DC

## 2024-06-15 MED ORDER — HALOPERIDOL LACTATE 5 MG/ML IJ SOLN
5.0000 mg | Freq: Three times a day (TID) | INTRAMUSCULAR | Status: DC | PRN
  Administered 2024-06-16: 5 mg via INTRAMUSCULAR
  Filled 2024-06-15: qty 1

## 2024-06-15 MED ORDER — LACTATED RINGERS IV SOLN: INTRAVENOUS | Status: DC

## 2024-06-15 MED ORDER — DIPHENHYDRAMINE HCL 25 MG PO CAPS: 50.0000 mg | ORAL_CAPSULE | Freq: Three times a day (TID) | ORAL | Status: DC | PRN

## 2024-06-15 MED ORDER — SERTRALINE HCL 50 MG PO TABS
50.0000 mg | ORAL_TABLET | Freq: Every day | ORAL | Status: DC
  Administered 2024-06-15: 50 mg via ORAL
  Filled 2024-06-15: qty 1

## 2024-06-15 MED ORDER — HALOPERIDOL LACTATE 5 MG/ML IJ SOLN: 10.0000 mg | Freq: Three times a day (TID) | INTRAMUSCULAR | Status: DC | PRN

## 2024-06-15 MED ORDER — TRAZODONE HCL 50 MG PO TABS: 50.0000 mg | ORAL_TABLET | Freq: Every evening | ORAL | Status: DC | PRN

## 2024-06-15 MED ORDER — LORAZEPAM 2 MG/ML IJ SOLN: 2.0000 mg | Freq: Three times a day (TID) | INTRAMUSCULAR | Status: DC | PRN

## 2024-06-15 MED ORDER — DIPHENHYDRAMINE HCL 50 MG/ML IJ SOLN: 50.0000 mg | Freq: Three times a day (TID) | INTRAMUSCULAR | Status: DC | PRN

## 2024-06-15 MED ORDER — NICOTINE 21 MG/24HR TD PT24
21.0000 mg | MEDICATED_PATCH | Freq: Every day | TRANSDERMAL | Status: DC
  Administered 2024-06-16 – 2024-06-19 (×4): 21 mg via TRANSDERMAL
  Filled 2024-06-15 (×5): qty 1

## 2024-06-15 NOTE — BH Assessment (Signed)
 Patient is to be admitted to [facility] BMU today 06/15/24 by Dr. Jadapalle.  Attending Physician will be Dr. Jadapalle.   Patient has been assigned to room 306, by [facility] Charge Nurse Claude.    ER staff is aware of the admission: Luann, ER Secretary   Dr. Levander, ER MD  Leonor, Patient's Nurse  Ileana, Patient Access.

## 2024-06-15 NOTE — ED Notes (Signed)
 Lunch tray provided to pt.

## 2024-06-15 NOTE — ED Provider Notes (Signed)
 [facility] Provider Note    Event Date/Time   First MD Initiated Contact with Patient 06/15/24 0142     (approximate)   History   Drug Overdose   HPI  Amy Gomez is a 43 y.o. female with history of hypertension, migraines, methamphetamine use disorder who presents to the emergency department after a suicide attempt.  Patient reports that she took between 39-18 Pamprin tablets tonight between 11 PM and 12 AM and attempt to kill herself.  She states that initially she thought that is what she wanted but then 30 minutes after taking the medication she realized she did not want to die.  She then called 911.  She has had nausea with no vomiting.  Did have some abdominal discomfort but this has resolved.  Denies any other coingestions.  States she is supposed to be on Zoloft  but took herself off of this about 2 to 3 months ago.  She denies any other drug or alcohol use.   History provided by patient, EMS.    Past Medical History:  Diagnosis Date   Hypertension    Kidney stones    Leukocytosis 08/16/2018   Migraine     Past Surgical History:  Procedure Laterality Date   DILATION AND CURETTAGE OF UTERUS     TUBAL LIGATION Bilateral 2007    MEDICATIONS:  Prior to Admission medications   Medication Sig Start Date End Date Taking? Authorizing Provider  lisinopril  (ZESTRIL ) 30 MG tablet Take 1 tablet (30 mg total) by mouth daily. 01/18/24   Lemon Raisin, MD  rizatriptan  (MAXALT ) 10 MG tablet Take 1 tablet (10 mg total) by mouth as needed for migraine. May repeat in 2 hours if needed Patient not taking: Reported on 03/14/2024 10/16/23   Justus Leita DEL, MD  sertraline  (ZOLOFT ) 50 MG tablet Take 1 tablet by mouth once daily 04/12/24   Berglund, Laura H, MD  traMADol  (ULTRAM ) 50 MG tablet TAKE 1 TABLET BY MOUTH EVERY 12 HOURS AS NEEDED 06/03/24   Berglund, Laura H, MD  traZODone  (DESYREL ) 50 MG tablet Take 1-2 tablets (50-100 mg total) by mouth at bedtime  as needed for sleep. 03/14/24   Justus Leita DEL, MD  diphenhydrAMINE  HCl (BENADRYL  PO) Take by mouth as needed.  09/03/20  [provider]  omeprazole  (PRILOSEC) 10 MG capsule Take 1 capsule (10 mg total) by mouth daily. Patient not taking: Reported on 09/26/2019 09/24/19 12/06/19  Ana Dorn BIRCH, PA-C    Physical Exam   Triage Vital Signs: ED Triage Vitals [06/15/24 0125]  Encounter Vitals Group     BP (!) 116/105     Girls Systolic BP Percentile      Girls Diastolic BP Percentile      Boys Systolic BP Percentile      Boys Diastolic BP Percentile      Pulse Rate 81     Resp 19     Temp      Temp src      SpO2 100 %     Weight 134 lb 8 oz (61 kg)     Height 5' (1.524 m)     Head Circumference      Peak Flow      Pain Score 0     Pain Loc      Pain Education      Exclude from Growth Chart     Most recent vital signs: Vitals:   06/15/24 0330 06/15/24 0554  BP: 129/82 138/86  Pulse: 88 87  Resp: 19 18  Temp:    SpO2: 100% 100%    CONSTITUTIONAL: Alert, responds appropriately to questions. Well-appearing; well-nourished HEAD: Normocephalic, atraumatic EYES: Conjunctivae clear, pupils appear equal, sclera nonicteric ENT: normal nose; moist mucous membranes NECK: Supple, normal ROM CARD: RRR; S1 and S2 appreciated RESP: Normal chest excursion without splinting or tachypnea; breath sounds clear and equal bilaterally; no wheezes, no rhonchi, no rales, no hypoxia or respiratory distress, speaking full sentences ABD/GI: Non-distended; soft, non-tender, no rebound, no guarding, no peritoneal signs BACK: The back appears normal EXT: Normal ROM in all joints; no deformity noted, no edema SKIN: Normal color for age and race; warm; no rash on exposed skin NEURO: Moves all extremities equally, normal speech PSYCH: The patient's mood and manner are appropriate.   ED Results / Procedures / Treatments   LABS: (all labs ordered are listed, but only abnormal  results are displayed) Labs Reviewed  COMPREHENSIVE METABOLIC PANEL WITH GFR - Abnormal; Notable for the following components:      Result Value   Potassium 3.2 (*)    Glucose, Bld 106 (*)    All other components within normal limits  ACETAMINOPHEN  LEVEL - Abnormal; Notable for the following components:   Acetaminophen  (Tylenol ), Serum 63 (*)    All other components within normal limits  URINE DRUG SCREEN, QUALITATIVE ([facility] ONLY) - Abnormal; Notable for the following components:   Amphetamines, Ur Screen POSITIVE (*)    All other components within normal limits  CBC WITH DIFFERENTIAL/PLATELET - Abnormal; Notable for the following components:   WBC 11.0 (*)    All other components within normal limits  URINALYSIS, ROUTINE W REFLEX MICROSCOPIC - Abnormal; Notable for the following components:   Color, Urine STRAW (*)    APPearance CLEAR (*)    Specific Gravity, Urine 1.002 (*)    Ketones, ur 5 (*)    All other components within normal limits  ACETAMINOPHEN  LEVEL - Abnormal; Notable for the following components:   Acetaminophen  (Tylenol ), Serum 54 (*)    All other components within normal limits  COMPREHENSIVE METABOLIC PANEL WITH GFR - Abnormal; Notable for the following components:   Potassium 3.3 (*)    Glucose, Bld 110 (*)    BUN 5 (*)    Calcium  8.5 (*)    Total Protein 6.0 (*)    Albumin 3.4 (*)    All other components within normal limits  CBG MONITORING, ED - Abnormal; Notable for the following components:   Glucose-Capillary 102 (*)    All other components within normal limits  SALICYLATE LEVEL  ETHANOL  MAGNESIUM   SALICYLATE LEVEL  PREGNANCY, URINE  ACETAMINOPHEN  LEVEL  SALICYLATE LEVEL  COMPREHENSIVE METABOLIC PANEL WITH GFR     EKG:  EKG Interpretation Date/Time:  Wednesday June 15 2024 01:49:46 EDT Ventricular Rate:  106 PR Interval:  111 QRS Duration:  80 QT Interval:  362 QTC Calculation: 481 R Axis:   92  Text Interpretation: Sinus tachycardia  Borderline right axis deviation Confirmed by Neomi Neptune [phone]) on 06/15/2024 2:02:20 AM         RADIOLOGY: My personal review and interpretation of imaging:    I have personally reviewed all radiology reports.   No results found.   PROCEDURES:  Critical Care performed: Yes, see critical care procedure note(s)   CRITICAL CARE Performed by: Neptune Neomi   Total critical care time: 30 minutes  Critical care time was exclusive of separately billable procedures and treating other patients.  Critical care was necessary to treat or prevent imminent or life-threatening deterioration.  Critical care was time spent personally by me on the following activities: development of treatment plan with patient and/or surrogate as well as nursing, discussions with consultants, evaluation of patient's response to treatment, examination of patient, obtaining history from patient or surrogate, ordering and performing treatments and interventions, ordering and review of laboratory studies, ordering and review of radiographic studies, pulse oximetry and re-evaluation of patient's condition.   SABRA1-3 Lead EKG Interpretation  Performed by: Blonnie Maske, Josette SAILOR, DO Authorized by: Dorothy Polhemus, Josette SAILOR, DO     Interpretation: normal     ECG rate:  81   ECG rate assessment: normal     Rhythm: sinus rhythm     Ectopy: none     Conduction: normal       IMPRESSION / MDM / ASSESSMENT AND PLAN / ED COURSE  I reviewed the triage vital signs and the nursing notes.    Patient here after intentional overdose.  The patient is on the cardiac monitor to evaluate for evidence of arrhythmia and/or significant heart rate changes.   DIFFERENTIAL DIAGNOSIS (includes but not limited to):   Depression, anxiety, intentional overdose, substance use disorder   Patient's presentation is most consistent with acute presentation with potential threat to life or bodily function.   PLAN: Poison control recommends 4-hour  Tylenol  level, salicylate and CMP levels now with repeat in 3 hours, EKG.  Recommend starting IV fluids.  Recommends medical observation for 6 hours.  Patient here voluntarily at this time but will need IVC if she wants to leave.   MEDICATIONS GIVEN IN ED: Medications  lactated ringers  infusion ( Intravenous New Bag/Given 06/15/24 0156)  traZODone  (DESYREL ) tablet 50-100 mg (has no administration in time range)  sertraline  (ZOLOFT ) tablet 50 mg (has no administration in time range)  lisinopril  (ZESTRIL ) tablet 30 mg (has no administration in time range)  potassium chloride  10 mEq in 100 mL IVPB (0 mEq Intravenous Stopped 06/15/24 0551)  potassium chloride  SA (KLOR-CON  M) CR tablet 40 mEq (40 mEq Oral Given 06/15/24 0602)     ED COURSE: Initial Tylenol  level 63.  Repeat down to 54.  She does not need N-acetylcysteine.  Drug screen positive for amphetamines.  Initial salicylate level 13.1.  Repeat pending.  Potassium of 3.2.  Will give replacement.  EKG shows no interval changes.  5:55 AM  Spoke with Franky with poison control.  Salicylate level now up to 17.8.  He recommends obtaining repeat labs at 745AM including Tylenol  level, salicylate level and CMP.  He states that as long as Tylenol  and salicylate levels continue to downtrend, LFTs, creatinine and bicarb stay normal that patient can be medically cleared.  Patient has been making a good amount of urine per nursing staff.  Patient will be signed out to the oncoming ED physician at 7 AM.  CONSULTS: TTS and psychiatry consulted for further disposition.   OUTSIDE RECORDS REVIEWED: Reviewed last neurology note in 2023 for migraine headaches.       FINAL CLINICAL IMPRESSION(S) / ED DIAGNOSES   Final diagnoses:  Intentional drug overdose, initial encounter [facility])     Rx / DC Orders   ED Discharge Orders     None        Note:  This document was prepared using Dragon voice recognition software and may include unintentional  dictation errors.   Brennyn Ortlieb, Josette SAILOR, DO 06/15/24 [phone]

## 2024-06-15 NOTE — Plan of Care (Signed)

## 2024-06-15 NOTE — ED Provider Notes (Addendum)
 Emergency Medicine Observation Re-evaluation Note  Amy Gomez is a 43 y.o. female, seen on rounds today.  Pt initially presented to the ED for complaints of Drug Overdose  Currently, the patient is calm, no acute complaints.  Physical Exam  Blood pressure (!) 151/91, pulse 78, temperature 97.7 F (36.5 C), resp. rate 19, height 5' (1.524 m), weight 61 kg, SpO2 99%. Physical Exam General: NAD Lungs: CTAB Psych: not agitated  ED Course / MDM  EKG:    I have reviewed the labs performed to date as well as medications administered while in observation.  Recent changes in the last 24 hours include no acute events overnight.  Patient now wanting to leave the emergency department.  She is demonstrated a danger to herself with intentional overdose with intent to kill her self.  Will IVC.  Repeat Tylenol  and salicylate levels are normal.  Salicylate downtrending.  Medically stable for psychiatric disposition.  Plan  Current plan is for psych eval/dispo. Patient is under full IVC at this time.   Viviann Pastor, MD 06/15/24 [phone]   Psych consult reviewed, recommended for inpatient psychiatric treatment.   Viviann Pastor, MD 06/15/24 [phone]

## 2024-06-15 NOTE — ED Notes (Signed)
 Poison control Giles, RN) contacted and the following recommendations were provided:   Non-toxic dose  Baseline EKG for prolong QTc Tylenol  level 4 hours post-ingestion  CMP (repeat 3 hours after initial) Salicylate level (repeat 3 hours after initial) UDS  Advised some IV hydration with LR  Monitor for ~6 hours from time of ingestion

## 2024-06-15 NOTE — ED Notes (Signed)
 Daughter visiting pt at this time. Daughter and pt calm and cooperative. Security, daughter, and pt in dayroom.

## 2024-06-15 NOTE — ED Notes (Signed)
Report called to Angela RN

## 2024-06-15 NOTE — ED Notes (Addendum)
 This RN at bedside, pt tearful and states that she is embarassed about being here. Spoke with pt and explained that she is IVC'd at this time and would be unable to leave. States that she wants to speak with an [employer] about this. Asked to see IVC paperwork, explained that we are unable to see the paperwork at this time. [place], [facility] explained to patient as well. Pt now verbalized understanding.   Pt provided with toothbrush, toothpaste, and wash cloth per pt request.
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
## 2024-06-15 NOTE — ED Notes (Signed)
 Pt being admitted downstairs. Pt aware of admission and IVC. Report called. VS taken. Pt in wheelchair and with security and staff at transport. All belongings brought with pt.

## 2024-06-15 NOTE — ED Notes (Signed)
Pt using phone at this time.

## 2024-06-15 NOTE — ED Notes (Signed)
 Pt states now I want visitors.

## 2024-06-15 NOTE — Group Note (Signed)
 Date:  06/15/2024 Time:  8:49 PM  Group Topic/Focus:  Wrap-Up Group:   The focus of this group is to help patients review their daily goal of treatment and discuss progress on daily workbooks.    Participation Level:  Did Not Attend   Larrie Leita BRAVO 06/15/2024, 8:49 PM

## 2024-06-15 NOTE — Progress Notes (Signed)
 Patient is seen by psychiatry and recommend inpatient psychiatric hospitalization on IVC

## 2024-06-15 NOTE — ED Notes (Addendum)
 Spoke with pt's grandmother, Arland Essex on pt status with pt's permission. Gave grandmother the visiting hours so that family could visit at visiting hours.

## 2024-06-15 NOTE — Plan of Care (Signed)
  Problem: Education: Goal: Knowledge of [facility] Education information/materials will improve 06/15/2024 1833 by Shirley Jon FALCON, RN Outcome: Not Progressing 06/15/2024 1810 by Shirley Jon FALCON, RN Outcome: Not Progressing Goal: Emotional status will improve 06/15/2024 1833 by Shirley Jon FALCON, RN Outcome: Not Progressing 06/15/2024 1810 by Shirley Jon FALCON, RN Outcome: Not Progressing Goal: Mental status will improve 06/15/2024 1833 by Shirley Jon FALCON, RN Outcome: Not Progressing 06/15/2024 1810 by Shirley Jon FALCON, RN Outcome: Not Progressing Goal: Verbalization of understanding the information provided will improve 06/15/2024 1833 by Shirley Jon FALCON, RN Outcome: Not Progressing 06/15/2024 1810 by Shirley Jon FALCON, RN Outcome: Not Progressing   Problem: Activity: Goal: Interest or engagement in activities will improve 06/15/2024 1833 by Shirley Jon FALCON, RN Outcome: Not Progressing 06/15/2024 1810 by Shirley Jon FALCON, RN Outcome: Not Progressing Goal: Sleeping patterns will improve 06/15/2024 1833 by Shirley Jon FALCON, RN Outcome: Not Progressing 06/15/2024 1810 by Shirley Jon FALCON, RN Outcome: Not Progressing   Problem: Coping: Goal: Ability to verbalize frustrations and anger appropriately will improve 06/15/2024 1833 by Shirley Jon FALCON, RN Outcome: Not Progressing 06/15/2024 1810 by Shirley Jon FALCON, RN Outcome: Not Progressing Goal: Ability to demonstrate self-control will improve 06/15/2024 1833 by Shirley Jon FALCON, RN Outcome: Not Progressing 06/15/2024 1810 by Shirley Jon FALCON, RN Outcome: Not Progressing   Problem: Health Behavior/Discharge Planning: Goal: Identification of resources available to assist in meeting health care needs will improve 06/15/2024 1833 by Shirley Jon FALCON, RN Outcome: Not Progressing 06/15/2024 1810 by Shirley Jon FALCON, RN Outcome: Not Progressing Goal: Compliance with treatment plan for underlying cause of condition will  improve 06/15/2024 1833 by Shirley Jon FALCON, RN Outcome: Not Progressing 06/15/2024 1810 by Shirley Jon FALCON, RN Outcome: Not Progressing   Problem: Physical Regulation: Goal: Ability to maintain clinical measurements within normal limits will improve 06/15/2024 1833 by Shirley Jon FALCON, RN Outcome: Not Progressing 06/15/2024 1810 by Shirley Jon FALCON, RN Outcome: Not Progressing   Problem: Safety: Goal: Periods of time without injury will increase 06/15/2024 1833 by Shirley Jon FALCON, RN Outcome: Not Progressing 06/15/2024 1810 by Shirley Jon FALCON, RN Outcome: Not Progressing
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
## 2024-06-15 NOTE — ED Notes (Addendum)
 Pt agitated and stating she wants to get out of here, go to rehab, & call her lawyer. Pt educated again on process and protocols of IVC order. RN tells pt she can have phone at [employer] time. Pt walks back to room at this time.

## 2024-06-15 NOTE — ED Notes (Signed)
 Psych team speaking with pt now.

## 2024-06-15 NOTE — ED Notes (Signed)
 Assumed care of pt. Report received from previous RN. Pt alert and oriented. Pt RR even and unlabored. Pt denies pain at this time. Pt agitated about being here and wants to leave. RN educated pt on process and IVC protocols. Pt walks back into room and states I do not want any visitors.

## 2024-06-15 NOTE — ED Notes (Signed)
 Snacks provided to pt

## 2024-06-15 NOTE — ED Notes (Signed)
VOL/ Pending consult 

## 2024-06-15 NOTE — Group Note (Signed)
 Date:  06/15/2024 Time:  5:24 PM  Group Topic/Focus:  Managing Feelings:   The focus of this group is to identify what feelings patients have difficulty handling and develop a plan to handle them in a healthier way upon discharge.    Participation Level:  Did Not Attend   Camellia HERO Ciarra Braddy 06/15/2024, 5:24 PM

## 2024-06-15 NOTE — ED Notes (Signed)
 IVC PENDING  CONSULT ?

## 2024-06-15 NOTE — Progress Notes (Signed)
 Pt presents irritable and upset about being in here. Pt given education. Pt was redirectable and was given support and encouragement. Pt denies SI/HI/AVH. Pt being monitored Q 15 minutes for safety per unit protocol, remains safe on the unit

## 2024-06-15 NOTE — ED Notes (Signed)
IVC  PENDING  CONSULT   MOVED  TO  [facility]

## 2024-06-16 NOTE — Plan of Care (Signed)
  Problem: Activity: Goal: Sleeping patterns will improve Outcome: Progressing   Problem: Safety: Goal: Periods of time without injury will increase Outcome: Progressing   Problem: Education: Goal: Emotional status will improve Outcome: Not Progressing   Problem: Education: Goal: Mental status will improve Outcome: Not Progressing   Problem: Coping: Goal: Ability to demonstrate self-control will improve Outcome: Not Progressing   Problem: Coping: Goal: Ability to verbalize frustrations and anger appropriately will improve Outcome: Not Progressing

## 2024-06-16 NOTE — Plan of Care (Signed)
   Problem: Education: Goal: Emotional status will improve Outcome: Progressing Goal: Mental status will improve Outcome: Progressing

## 2024-06-16 NOTE — Group Note (Signed)
 Date:  06/16/2024 Time:  6:28 PM  Group Topic/Focus:  Goals Group:   The focus of this group is to help patients establish daily goals to achieve during treatment and discuss how the patient can incorporate goal setting into their daily lives to aide in recovery.    Participation Level:  Did Not Attend   Deitra [address] [facility] 06/16/2024, 6:28 PM

## 2024-06-16 NOTE — Group Note (Deleted)
 LCSW Group Therapy Note  Group Date: 06/16/2024 Start Time: 1315 End Time: 1400   Type of Therapy and Topic:  Group Therapy: Positive Affirmations  Participation Level:  {[facility] PARTICIPATION OZCZO:[id number]}   Description of Group:   This group addressed positive affirmation towards self and others.  Patients went around the room and identified two positive things about themselves and two positive things about a peer in the room.  Patients reflected on how it felt to share something positive with others, to identify positive things about themselves, and to hear positive things from others/ Patients were encouraged to have a daily reflection of positive characteristics or circumstances.   Therapeutic Goals: 1. Patients will verbalize two of their positive qualities 2. Patients will demonstrate empathy for others by stating two positive qualities about a peer in the group 3. Patients will verbalize their feelings when voicing positive self affirmations and when voicing positive affirmations of others 4. Patients will discuss the potential positive impact on their wellness/recovery of focusing on positive traits of self and others.  Summary of Patient Progress:  *** actively engaged in the discussion and . S*** was able***or not able to identify positive affirmations about ***self as well as other group members. Patient demonstrated *** insight into the subject matter, was respectful of peers, participated throughout the entire session.  Therapeutic Modalities:   Cognitive Behavioral Therapy Motivational Interviewing    Lum JONETTA Croft, [place] 06/16/2024  2:09 PM

## 2024-06-16 NOTE — Progress Notes (Signed)
   06/16/24 1300  Psych Admission Type (Psych Patients Only)  Admission Status Involuntary  Psychosocial Assessment  Patient Complaints Agitation;Anxiety;Crying spells  Eye Contact Avertive  Facial Expression Anxious  Affect Anxious  Speech Logical/coherent  Interaction Hypervigilant  Motor Activity Restless  Appearance/Hygiene Unremarkable  Behavior Characteristics Anxious;Agitated  Mood Depressed  Aggressive Behavior  Effect No apparent injury  Thought Process  Coherency WDL  Content Preoccupation;Blaming others;Blaming self  Delusions None reported or observed  Perception WDL  Hallucination None reported or observed  Judgment Poor  Confusion WDL  Danger to Self  Current suicidal ideation? Denies  Agreement Not to Harm Self Yes  Description of Agreement Verbal  Danger to Others  Danger to Others None reported or observed   Amy Gomez began the shift wanting to speak with the provider about discharge. It was determined that discharge is not a safe option at this time, and Amy Gomez became irritable with this decision. States that she feels nobody in this hospital cares and she should be allowed to leave. Stated multiple times that, I know it was just an accident and I made a stupid fucking mistake. Patient was able to be redirected and comforted during these times, and she remained irritable but redirectable until about 1700. At that time, Amy Gomez used the unit phones and became very irritated and disruptive. She was observed slamming the unit phones on the hook several times and was unable to be redirected. PO PRNs were offered to assist with agitation, but Amy Gomez refused and stated, I can just make myself throw them up. If you give them to me, I will just spit them out. Despite continued efforts to redirect Amy Gomez and help her calm down, she continued to escalate and grew even louder. At 1730, the decision was made to administer IM Benedryl 50mg , Haldol  5mg , and Ativan  2mg . Amy Gomez  was unhappy with this decision, and two security officers and a psychiatric tech were necessary to administer the medication. PRNs administered at 1741. PRNs appear effective, as Amy Gomez is observed resting in bed with her eyes closed as of 1820.

## 2024-06-16 NOTE — Group Note (Signed)
 Recreation Therapy Group Note   Group Topic:Goal Setting  Group Date: 06/16/2024 Start Time: 1000 End Time: 1055 Facilitators: Celestia Jeoffrey BRAVO, LRT, CTRS Location: Craft Room  Group Description: Product/process development scientist. Patients were given many different magazines, a glue stick, markers, and a piece of cardstock paper. LRT and pts discussed the importance of having goals in life. LRT and pts discussed the difference between short-term and long-term goals, as well as what a SMART goal is. LRT encouraged pts to create a vision board, with images they picked and then cut out with safety scissors from the magazine, for themselves, that capture their short and long-term goals. LRT encouraged pts to show and explain their vision board to the group.   Goal Area(s) Addressed:  Patient will gain knowledge of short vs. long term goals.  Patient will identify goals for themselves. Patient will practice setting SMART goals. Patient will verbalize their goals to LRT and peers.   Affect/Mood: N/A   Participation Level: Did not attend    Clinical Observations/Individualized Feedback: Patient did not attend group.   Plan: Continue to engage patient in RT group sessions 2-3x/week.   Jeoffrey BRAVO Celestia, LRT, CTRS 06/16/2024 1:08 PM

## 2024-06-16 NOTE — Group Note (Signed)
 Date:  06/16/2024 Time:  8:28 PM  Group Topic/Focus:  Wrap-Up Group:   The focus of this group is to help patients review their daily goal of treatment and discuss progress on daily workbooks.    Participation Level:  Did Not Attend  Participation Quality:  None  Affect:  none  Cognitive:  none  Insight: None  Engagement in Group:  none  Modes of Intervention:  none  Additional Comments:  none   Kerri Katz 06/16/2024, 8:28 PM
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
## 2024-06-16 NOTE — Group Note (Signed)
 [facility] LCSW Group Therapy Note   Group Date: 06/16/2024 Start Time: 1300 End Time: 1450   Type of Therapy and Topic: Group Therapy: Avoiding Self-Sabotaging and Enabling Behaviors  Participation Level: Did Not Attend  Mood:  Description of Group:  In this group, patients will learn how to identify obstacles, self-sabotaging and enabling behaviors, as well as: what are they, why do we do them and what needs these behaviors meet. Discuss unhealthy relationships and how to have positive healthy boundaries with those that sabotage and enable. Explore aspects of self-sabotage and enabling in yourself and how to limit these self-destructive behaviors in everyday life.   Therapeutic Goals: 1. Patient will identify one obstacle that relates to self-sabotage and enabling behaviors 2. Patient will identify one personal self-sabotaging or enabling behavior they did prior to admission 3. Patient will state a plan to change the above identified behavior 4. Patient will demonstrate ability to communicate their needs through discussion and/or role play.    Summary of Patient Progress:   Patient did not attend.    Therapeutic Modalities:  Cognitive Behavioral Therapy Person-Centered Therapy Motivational Interviewing    Amy CHRISTELLA Kerns, LCSW

## 2024-06-17 MED ORDER — DOXEPIN HCL 10 MG PO CAPS
10.0000 mg | ORAL_CAPSULE | Freq: Every day | ORAL | Status: DC
  Administered 2024-06-17 – 2024-06-18 (×2): 10 mg via ORAL
  Filled 2024-06-17 (×3): qty 1

## 2024-06-17 NOTE — Group Note (Signed)
 Date:  06/17/2024 Time:  11:30 AM  Group Topic/Focus:  Goals Group:   The focus of this group is to help patients establish daily goals to achieve during treatment and discuss how the patient can incorporate goal setting into their daily lives to aide in recovery.  Participation Level:  Did Not Attend  Xariah Silvernail A Cassandre Oleksy 06/17/2024, 11:30 AM

## 2024-06-17 NOTE — Plan of Care (Signed)
  Problem: Education: Goal: Emotional status will improve Outcome: Progressing   Problem: Education: Goal: Mental status will improve Outcome: Progressing   Problem: Education: Goal: Verbalization of understanding the information provided will improve Outcome: Progressing   Problem: Activity: Goal: Sleeping patterns will improve Outcome: Progressing   Problem: Coping: Goal: Ability to verbalize frustrations and anger appropriately will improve Outcome: Progressing   Problem: Coping: Goal: Ability to demonstrate self-control will improve Outcome: Progressing   Problem: Physical Regulation: Goal: Ability to maintain clinical measurements within normal limits will improve Outcome: Progressing

## 2024-06-17 NOTE — Group Note (Signed)
 Date:  06/17/2024 Time:  8:47 PM  Group Topic/Focus:  Wrap-Up Group:   The focus of this group is to help patients review their daily goal of treatment and discuss progress on daily workbooks.    Participation Level:  Did Not Attend  Larrie Leita BRAVO 06/17/2024, 8:47 PM

## 2024-06-17 NOTE — BH IP Treatment Plan (Signed)
 Interdisciplinary Treatment and Diagnostic Plan Update  06/17/2024 Time of Session: 10:35 AM Amy Gomez MRN: [id number]  Principal Diagnosis: Bipolar 2 disorder (HCC)  Secondary Diagnoses: Principal Problem:   Bipolar 2 disorder (HCC)   Current Medications:  Current Facility-Administered Medications  Medication Dose Route Frequency Provider Last Rate Last Admin   alum & mag hydroxide-simeth (MAALOX/MYLANTA) 200-200-20 MG/5ML suspension 30 mL  30 mL Oral Q4H PRN Jadapalle, Sree, MD       haloperidol  (HALDOL ) tablet 5 mg  5 mg Oral TID PRN Jadapalle, Sree, MD       And   diphenhydrAMINE  (BENADRYL ) capsule 50 mg  50 mg Oral TID PRN Jadapalle, Sree, MD       haloperidol  lactate (HALDOL ) injection 5 mg  5 mg Intramuscular TID PRN Jadapalle, Sree, MD   5 mg at 06/16/24 1740   And   diphenhydrAMINE  (BENADRYL ) injection 50 mg  50 mg Intramuscular TID PRN Jadapalle, Sree, MD   50 mg at 06/16/24 1740   And   LORazepam  (ATIVAN ) injection 2 mg  2 mg Intramuscular TID PRN Jadapalle, Sree, MD   2 mg at 06/16/24 1741   haloperidol  lactate (HALDOL ) injection 10 mg  10 mg Intramuscular TID PRN Jadapalle, Sree, MD       And   diphenhydrAMINE  (BENADRYL ) injection 50 mg  50 mg Intramuscular TID PRN Jadapalle, Sree, MD       And   LORazepam  (ATIVAN ) injection 2 mg  2 mg Intramuscular TID PRN Jadapalle, Sree, MD       hydrOXYzine  (ATARAX ) tablet 25 mg  25 mg Oral Q6H PRN Jadapalle, Sree, MD       magnesium  hydroxide (MILK OF MAGNESIA) suspension 30 mL  30 mL Oral Daily PRN Donnelly Mellow, MD       nicotine  (NICODERM CQ  - dosed in mg/24 hours) patch 21 mg  21 mg Transdermal Daily Jadapalle, Sree, MD   21 mg at 06/16/24 0845   nicotine  polacrilex (NICORETTE ) gum 2 mg  2 mg Oral PRN Jadapalle, Sree, MD       PTA Medications: Medications Prior to Admission  Medication Sig Dispense Refill Last Dose/Taking   lisinopril  (ZESTRIL ) 30 MG tablet Take 1 tablet (30 mg total) by mouth daily. 90 tablet 3     sertraline  (ZOLOFT ) 50 MG tablet Take 1 tablet by mouth once daily (Patient not taking: Reported on 06/15/2024) 90 tablet 0    traZODone  (DESYREL ) 50 MG tablet Take 1-2 tablets (50-100 mg total) by mouth at bedtime as needed for sleep. 30 tablet 0     Patient Stressors:    Patient Strengths:    Treatment Modalities: Medication Management, Group therapy, Case management,  1 to 1 session with clinician, Psychoeducation, Recreational therapy.   Physician Treatment Plan for Primary Diagnosis: Bipolar 2 disorder (HCC) Long Term Goal(s): Improvement in symptoms so as ready for discharge   Short Term Goals: Ability to identify changes in lifestyle to reduce recurrence of condition will improve  Medication Management: Evaluate patient's response, side effects, and tolerance of medication regimen.  Therapeutic Interventions: 1 to 1 sessions, Unit Group sessions and Medication administration.  Evaluation of Outcomes: Not Met  Physician Treatment Plan for Secondary Diagnosis: Principal Problem:   Bipolar 2 disorder (HCC)  Long Term Goal(s): Improvement in symptoms so as ready for discharge   Short Term Goals: Ability to identify changes in lifestyle to reduce recurrence of condition will improve     Medication Management: Evaluate patient's response, side  effects, and tolerance of medication regimen.  Therapeutic Interventions: 1 to 1 sessions, Unit Group sessions and Medication administration.  Evaluation of Outcomes: Not Met   RN Treatment Plan for Primary Diagnosis: Bipolar 2 disorder (HCC) Long Term Goal(s): Knowledge of disease and therapeutic regimen to maintain health will improve  Short Term Goals: Ability to verbalize frustration and anger appropriately will improve, Ability to demonstrate self-control, Ability to participate in decision making will improve, Ability to verbalize feelings will improve, Ability to disclose and discuss suicidal ideas, Ability to identify and  develop effective coping behaviors will improve, and Compliance with prescribed medications will improve  Medication Management: RN will administer medications as ordered by provider, will assess and evaluate patient's response and provide education to patient for prescribed medication. RN will report any adverse and/or side effects to prescribing provider.  Therapeutic Interventions: 1 on 1 counseling sessions, Psychoeducation, Medication administration, Evaluate responses to treatment, Monitor vital signs and CBGs as ordered, Perform/monitor CIWA, COWS, AIMS and Fall Risk screenings as ordered, Perform wound care treatments as ordered.  Evaluation of Outcomes: Not Met   LCSW Treatment Plan for Primary Diagnosis: Bipolar 2 disorder (HCC) Long Term Goal(s): Safe transition to appropriate next level of care at discharge, Engage patient in therapeutic group addressing interpersonal concerns.  Short Term Goals: Engage patient in aftercare planning with referrals and resources, Increase social support, Increase ability to appropriately verbalize feelings, Increase emotional regulation, Facilitate acceptance of mental health diagnosis and concerns, Facilitate patient progression through stages of change regarding substance use diagnoses and concerns, and Identify triggers associated with mental health/substance abuse issues  Therapeutic Interventions: Assess for all discharge needs, 1 to 1 time with Social worker, Explore available resources and support systems, Assess for adequacy in community support network, Educate family and significant other(s) on suicide prevention, Complete Psychosocial Assessment, Interpersonal group therapy.  Evaluation of Outcomes: Not Met   Progress in Treatment: Attending groups: No. Participating in groups: No. Taking medication as prescribed: Yes. Toleration medication: Yes. Family/Significant other contact made: No, will contact:  CSW to contact once permission is  granted.  Patient understands diagnosis: Yes. Discussing patient identified problems/goals with staff: Yes. Medical problems stabilized or resolved: No. Denies suicidal/homicidal ideation: Yes. Issues/concerns per patient self-inventory: Yes. Other: None  New problem(s) identified: No, Describe:  Patient is not involved in treatment and is irate toward staff. Patient is not in agreement with programming.   New Short Term/Long Term Goal(s):detox, elimination of symptoms of psychosis, medication management for mood stabilization; elimination of SI thoughts; development of comprehensive mental wellness/sobriety plan.    Patient Goals:  Patient declined to attend treatment.   Discharge Plan or Barriers: CSW to assist with the development of appropriate discharge plan.    Reason for Continuation of Hospitalization: Aggression Anxiety Delusions  Depression Mania Medical Issues Suicidal ideation  Estimated Length of Stay: 1-7 days.   Last 3 [place] Suicide Severity Risk Score: Flowsheet Row Admission (Current) from 06/15/2024 in [facility] INPATIENT BEHAVIORAL MEDICINE Most recent reading at 06/15/2024  4:14 PM ED from 06/15/2024 in [facility] Emergency Department at [facility] Most recent reading at 06/15/2024  2:22 AM UC from 10/21/2023 in [facility] at [facility]  Most recent reading at 10/21/2023 10:18 AM  C-SSRS RISK CATEGORY High Risk High Risk No Risk    Last PHQ 2/9 Scores:    03/14/2024   10:41 AM 01/26/2024    9:07 AM 01/18/2024    1:21 PM  Depression screen PHQ 2/9  Decreased Interest 3 0 0  Down, Depressed, Hopeless 3 0 0  PHQ - 2 Score 6 0 0  Altered sleeping 3 0 0  Tired, decreased energy 3 0 0  Change in appetite 3 0 0  Feeling bad or failure about yourself  3 0 0  Trouble concentrating 3 0 0  Moving slowly or fidgety/restless 3 0 0  Suicidal thoughts  0 0  PHQ-9 Score 24 0 0  Difficult doing work/chores Extremely dIfficult Not difficult at all Not  difficult at all    Scribe for Treatment Team: Micayla Brathwaite M Kelcie Currie, LCSW 06/17/2024 10:54 AM
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
## 2024-06-17 NOTE — Progress Notes (Signed)
 Nursing Shift Note:  1900-0700  Attended Evening Group: no Medication Compliant:  yes Behavior: demanding, irritable Sleep Quality: good Significant Changes: none   06/17/24 2200  Psychosocial Assessment  Patient Complaints Anger;Agitation;Panic attack  Eye Contact Darting  Facial Expression Anxious  Affect Anxious  Speech Logical/coherent  Interaction Attention-seeking  Motor Activity Restless  Appearance/Hygiene Unremarkable  Behavior Characteristics Anxious;Irritable  Mood Sad;Anxious;Labile;Irritable  Thought Process  Coherency WDL  Content Preoccupation;Blaming others  Delusions None reported or observed  Perception WDL  Hallucination None reported or observed  Judgment Poor  Confusion None  Danger to Self  Current suicidal ideation? Denies  Danger to Others  Danger to Others None reported or observed   .

## 2024-06-17 NOTE — Progress Notes (Signed)
   06/17/24 1200  Psych Admission Type (Psych Patients Only)  Admission Status Involuntary  Psychosocial Assessment  Patient Complaints Anger;Crying spells;Worrying;Worthlessness  Surveyor, mining  Facial Expression Anxious  Affect Anxious  Speech Logical/coherent  Interaction Hypervigilant  Motor Activity Slow (Reports exhaustion from yesterday's PRN medications.)  Appearance/Hygiene Unremarkable  Behavior Characteristics Anxious;Irritable  Mood Sad;Anxious;Other (Comment) (Expresses anger at self for decisions she made that caused this admission)  Aggressive Behavior  Effect No apparent injury  Thought Process  Coherency WDL  Content Preoccupation;Blaming others  Delusions None reported or observed  Perception WDL  Hallucination None reported or observed  Judgment Poor (Judgment improved from yesterday)  Confusion None  Danger to Self  Current suicidal ideation? Denies  Agreement Not to Harm Self Yes  Description of Agreement Verbal  Danger to Others  Danger to Others None reported or observed   Sumner spent almost all of this shift resting in bed. Reports sadness regarding missing her job and that she is IVC'd. Significantly more redirectable this shift, and required no further PRNs. Stated that her goal is to leave and return to work. Denies SI/HI/SH, denies AVH.

## 2024-06-17 NOTE — Plan of Care (Signed)
   Problem: Education: Goal: Emotional status will improve Outcome: Progressing Goal: Mental status will improve Outcome: Progressing

## 2024-06-17 NOTE — Plan of Care (Signed)
  Problem: Education: Goal: Knowledge of Time General Education information/materials will improve Outcome: Progressing Goal: Emotional status will improve Outcome: Not Progressing Goal: Mental status will improve Outcome: Not Progressing Goal: Verbalization of understanding the information provided will improve Outcome: Progressing

## 2024-06-17 NOTE — Group Note (Signed)
 Recreation Therapy Group Note   Group Topic:Leisure Education  Group Date: 06/17/2024 Start Time: 1040 End Time: 1140 Facilitators: Celestia Jeoffrey BRAVO, LRT, CTRS Location: Craft Room  Group Description: Leisure. Patients were given the option to choose from singing karaoke, coloring mandalas, using oil pastels, journaling, painting or playing with play-doh. LRT and pts discussed the meaning of leisure, the importance of participating in leisure during their free time/when they're outside of the hospital, as well as how our leisure interests can also serve as coping skills.   Goal Area(s) Addressed:  Patient will identify a current leisure interest.  Patient will learn the definition of "leisure". Patient will practice making a positive decision. Patient will have the opportunity to try a new leisure activity. Patient will communicate with peers and LRT.    Affect/Mood: N/A   Participation Level: Did not attend    Clinical Observations/Individualized Feedback: Patient did not attend group.   Plan: Continue to engage patient in RT group sessions 2-3x/week.   Jeoffrey BRAVO Celestia, LRT, CTRS 06/17/2024 1:11 PM

## 2024-06-17 NOTE — Progress Notes (Signed)
 Pt was given IM medications prior to this writer coming on shift. Pt slept through the night and is without complaint at this time. Pt remains safe on the unit

## 2024-06-18 NOTE — Group Note (Signed)
 Date:  06/18/2024 Time:  9:30 PM  Group Topic/Focus:  Recovery Goals:   The focus of this group is to identify appropriate goals for recovery and establish a plan to achieve them.    PT did not attend group.  Charmel Pronovost L 06/18/2024, 9:30 PM

## 2024-06-18 NOTE — Plan of Care (Signed)
  Problem: Education: Goal: Knowledge of [place] [facility] Education information/materials will improve Outcome: Adequate for Discharge Goal: Mental status will improve Outcome: Progressing   Problem: Activity: Goal: Interest or engagement in activities will improve Outcome: Progressing Goal: Sleeping patterns will improve Outcome: Progressing

## 2024-06-18 NOTE — Progress Notes (Signed)
   06/18/24 0900  Psych Admission Type (Psych Patients Only)  Admission Status Involuntary  Psychosocial Assessment  Patient Complaints Anxiety;Crying spells  Eye Contact Brief  Facial Expression Anxious  Affect Anxious  Speech Logical/coherent  Interaction Attention-seeking  Motor Activity Restless  Appearance/Hygiene Unremarkable  Behavior Characteristics Anxious  Mood Anxious  Thought Process  Coherency WDL  Content Preoccupation  Delusions None reported or observed  Perception WDL  Hallucination None reported or observed  Judgment Poor  Confusion None  Danger to Self  Current suicidal ideation? Denies  Agreement Not to Harm Self Yes  Description of Agreement verbal  Danger to Others  Danger to Others None reported or observed

## 2024-06-18 NOTE — [facility] Counselor (Signed)
 The patient requested to speak with a SW.  The patient stated that she was in withdrawal from Meth. Stating that the no one believed her and she wanted to go to rehab.   The LCSWA informed her that it would be considered for her aftercare plan.   The patient requested number for [facility]. The LCSWA provided the patient with the contact info for [facility].  Roselyn Lento, MSW, LCSWA
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
## 2024-06-19 NOTE — Progress Notes (Signed)
 At the start of the shift, Pt was seen in the dayroom interacting with peers.  She attended and participated in wrap up group.  Pt informed staff, "My day is good.  I am feeling better than when I was admitted.  I have been out and participating in the unit activities."  Pt Continued, "I believe it was because of the 'METH' that I threatened suicide.  I wanted only to get the attention of my ex-husband."  She denied SI/HI, AVH and depression, and continues to endorse anxiety rated mild to moderate.  Pt remains care compliant.   06/18/24 2200  Psych Admission Type (Psych Patients Only)  Admission Status Involuntary  Psychosocial Assessment  Patient Complaints Anxiety  Eye Contact Fair  Facial Expression Anxious  Affect Anxious;Appropriate to circumstance  Speech Logical/coherent  Interaction Assertive  Motor Activity Other (Comment) (WDL)  Appearance/Hygiene Unremarkable  Behavior Characteristics Anxious  Mood Anxious  Thought Process  Coherency WDL  Content WDL  Delusions None reported or observed  Perception WDL  Hallucination None reported or observed  Judgment Poor  Confusion None  Danger to Self  Current suicidal ideation? Denies  Agreement Not to Harm Self Yes  Description of Agreement Verbal   Problem: Education: Goal: Knowledge of [facility] Education information/materials will improve Outcome: Progressing Goal: Emotional status will improve Outcome: Progressing Goal: Mental status will improve Outcome: Progressing Goal: Verbalization of understanding the information provided will improve Outcome: Progressing   Problem: Activity: Goal: Interest or engagement in activities will improve Outcome: Progressing Goal: Sleeping patterns will improve Outcome: Progressing   Problem: Coping: Goal: Ability to verbalize frustrations and anger appropriately will improve Outcome: Progressing Goal: Ability to demonstrate self-control will improve Outcome:  Progressing

## 2024-06-19 NOTE — Progress Notes (Signed)
 [facility] Discharge Suicide Risk Assessment   Principal Problem: Bipolar 2 disorder [facility]) Discharge Diagnoses: Principal Problem:   Bipolar 2 disorder (HCC)   Total Time spent with patient: 45 minutes  Musculoskeletal: Strength & Muscle Tone: within normal limits Gait & Station: normal   Psychiatric Specialty Exam Appearance: Appropriately dressed, hair washed, improved grooming Eye Contact: Appropriate Behavior: Calm, cooperative, engaged Mood: Euthymic Affect: calm Thought Process: Linear, goal-directed Thought Content: No SI, HI, hallucinations, paranoia, or delusions Speech: Normal rate and tone Orientation: Fully oriented Insight: Improving Judgment: Improving Sleep: Improved Appetite: Stable   Sleep  Sleep:No data recorded Estimated Sleeping Duration (Last 24 Hours): 8.00-8.75 hours  Physical Exam: Physical Exam ROS Blood pressure (!) 159/99, pulse 98, temperature 97.6 F (36.4 C), resp. rate 18, height 5' (1.524 m), weight 58.5 kg, SpO2 100%. Body mass index is 25.19 kg/m.  Mental Status Per Nursing Assessment::   On Admission:  Intention to act on suicide plan (regretful that she impulsivly took 18 Pamprin)  Demographic Factors:  Divorced or widowed, Caucasian, and Living alone  Loss Factors: Loss of significant relationship  Historical Factors: Prior suicide attempts  Risk Reduction Factors:   Sense of responsibility to family, Religious beliefs about death, Employed, Living with another person, especially a relative, Positive social support, Positive therapeutic relationship, and Positive coping skills or problem solving skills  Continued Clinical Symptoms:  Alcohol/Substance Abuse/Dependencies  Cognitive Features That Contribute To Risk:  None    Suicide Risk:  Mild:  Suicidal ideation of limited frequency, intensity, duration, and specificity.  There are no identifiable plans, no associated intent, mild dysphoria and related symptoms, good  self-control (both objective and subjective assessment), few other risk factors, and identifiable protective factors, including available and accessible social support.   Follow-up Information     [facility]. Go to.   Why: Virtual assessment for therapy and medication management is 06/28/24 at 8:30 AM. Contact information: [address]  [address] [place] [address] [phone]                  Hoy CHRISTELLA Pinal, NP 06/19/2024, 6:58 PM

## 2024-06-19 NOTE — Group Note (Signed)
 Date:  06/19/2024 Time:  4:53 PM  Group Topic/Focus:  Healthy Communication:   The focus of this group is to discuss communication, barriers to communication, as well as healthy ways to communicate with others.    Participation Level:  Minimal  Participation Quality:  Inattentive  Affect:  Flat  Cognitive:  Appropriate  Insight: Limited  Engagement in Group:  Lacking  Modes of Intervention:  Socialization  Additional Comments:  n/a  Liliyana Thobe 06/19/2024, 4:53 PM

## 2024-06-19 NOTE — [facility] Suicide Risk Assessment (Signed)
 [facility] INPATIENT:  Family/Significant Other Suicide Prevention Education  Education Completed; Gerianne Simonet, daughter, [phone], has been identified by the patient as the family member/significant other with whom the patient will be residing, and identified as the person(s) who will aid the patient in the event of a mental health crisis (suicidal ideations/suicide attempt).  With written consent from the patient, the family member/significant other has been provided the following suicide prevention education, prior to the and/or following the discharge of the patient.  The suicide prevention education provided includes the following: Suicide risk factors Suicide prevention and interventions National Suicide Hotline telephone number [facility] assessment telephone number [facility] Emergency Assistance 911 [facility] and/or Residential Mobile Crisis Unit telephone number  Request made of family/significant other to: Remove weapons (e.g., guns, rifles, knives), all items previously/currently identified as safety concern.   Remove drugs/medications (over-the-counter, prescriptions, illicit drugs), all items previously/currently identified as a safety concern.  The family member/significant other verbalizes understanding of the suicide prevention education information provided.  The family member/significant other agrees to remove the items of safety concern listed above.  According to daughter, the patient has "overdosed on her prescriptions." Daughter confirmed that her mother lives in her own home and can return at discharge. Daughter reported that she does not believe the patient to be a danger to herself or others. Daughter reported that the patient's son in law has gathered all weapons and has them in his custody to be kept away from patient. At discharge, the patient's daughter will provide transportation. Daughter expressed no present safety concerns.   Rusty Villella,  MSW, LCSWA 06/19/2024 11:54 AM

## 2024-06-19 NOTE — Progress Notes (Signed)
 [facility] MD Progress Note  06/19/2024 3:29 PM Amy Gomez  MRN:  [id number]   Subjective:  Chart reviewed, case discussed in multidisciplinary meeting, patient seen during rounds.   Patient seen today in the day room, out of bed, engaged with peers. Reports her daughter has been visiting and she has family support for discharge. Denies suicidal ideation (SI) or homicidal ideation (HI). States she plans to remain sober and not use alcohol. Not tearful today, stating she realizes her actions and verbalizes that this stay has been helpful. Reports intent to follow up with outpatient mental health and therapy during this time of increased situational stressors. Reports improved sleep. Reports mood is stable denies anxiety other than that which surrounds being admitted to this setting. Denies auditory or visual hallucinations, paranoia, or delusions. Discussed plan discharge for tomorrow, pt verbalizes feeling safe and ready.   Sleep: Good  Appetite:  Good  Past Psychiatric History: see h&P Family History:  Family History  Problem Relation Age of Onset   Other Mother        unknown medical history   Other Father        unknown medical hisotry   Endometriosis Maternal Grandmother    Breast cancer Paternal Grandmother    Social History:  Social History   Substance and Sexual Activity  Alcohol Use No   Alcohol/week: 0.0 standard drinks of alcohol     Social History   Substance and Sexual Activity  Drug Use No    Social History   Socioeconomic History   Marital status: Legally Separated    Spouse name: Keiko Myricks   Number of children: Not on file   Years of education: Not on file   Highest education level: Not on file  Occupational History   Not on file  Tobacco Use   Smoking status: Former    Current packs/day: 0.00    Average packs/day: 0.5 packs/day for 28.6 years (14.3 ttl pk-yrs)    Types: Cigarettes    Start date: 89    Quit date: 06/11/2023    Years since  quitting: 1.0   Smokeless tobacco: Never  Vaping Use   Vaping status: Former  Substance and Sexual Activity   Alcohol use: No    Alcohol/week: 0.0 standard drinks of alcohol   Drug use: No   Sexual activity: Yes    Partners: Male    Birth control/protection: Surgical    Comment: tubal  Other Topics Concern   Not on file  Social History Narrative   Not on file   Social Drivers of Health   Financial Resource Strain: Low Risk  (04/11/2022)   Overall Financial Resource Strain (CARDIA)    Difficulty of Paying Living Expenses: Not hard at all  Food Insecurity: No Food Insecurity (06/15/2024)   Hunger Vital Sign    Worried About Running Out of Food in the Last Year: Never true    Ran Out of Food in the Last Year: Never true  Transportation Needs: Unmet Transportation Needs (06/15/2024)   PRAPARE - Administrator, Civil Service (Medical): Yes    Lack of Transportation (Non-Medical): Yes  Physical Activity: Not on file  Stress: Not on file  Social Connections: Not on file   Past Medical History:  Past Medical History:  Diagnosis Date   Hypertension    Kidney stones    Leukocytosis 08/16/2018   Migraine     Past Surgical History:  Procedure Laterality Date   DILATION AND CURETTAGE  OF UTERUS     TUBAL LIGATION Bilateral 2007    Current Medications: Current Facility-Administered Medications  Medication Dose Route Frequency Provider Last Rate Last Admin   alum & mag hydroxide-simeth (MAALOX/MYLANTA) 200-200-20 MG/5ML suspension 30 mL  30 mL Oral Q4H PRN Jadapalle, Sree, MD       haloperidol  (HALDOL ) tablet 5 mg  5 mg Oral TID PRN Jadapalle, Sree, MD       And   diphenhydrAMINE  (BENADRYL ) capsule 50 mg  50 mg Oral TID PRN Jadapalle, Sree, MD       haloperidol  lactate (HALDOL ) injection 5 mg  5 mg Intramuscular TID PRN Jadapalle, Sree, MD   5 mg at 06/16/24 1740   And   diphenhydrAMINE  (BENADRYL ) injection 50 mg  50 mg Intramuscular TID PRN Jadapalle, Sree, MD   50 mg at  06/16/24 1740   And   LORazepam  (ATIVAN ) injection 2 mg  2 mg Intramuscular TID PRN Jadapalle, Sree, MD   2 mg at 06/16/24 1741   haloperidol  lactate (HALDOL ) injection 10 mg  10 mg Intramuscular TID PRN Jadapalle, Sree, MD       And   diphenhydrAMINE  (BENADRYL ) injection 50 mg  50 mg Intramuscular TID PRN Jadapalle, Sree, MD       And   LORazepam  (ATIVAN ) injection 2 mg  2 mg Intramuscular TID PRN Jadapalle, Sree, MD       doxepin  (SINEQUAN ) capsule 10 mg  10 mg Oral QHS Cleotilde Hoy HERO, NP   10 mg at 06/18/24 2106   hydrOXYzine  (ATARAX ) tablet 25 mg  25 mg Oral Q6H PRN Jadapalle, Sree, MD       magnesium  hydroxide (MILK OF MAGNESIA) suspension 30 mL  30 mL Oral Daily PRN Donnelly Mellow, MD       nicotine  (NICODERM CQ  - dosed in mg/24 hours) patch 21 mg  21 mg Transdermal Daily Jadapalle, Sree, MD   21 mg at 06/18/24 2106   nicotine  polacrilex (NICORETTE ) gum 2 mg  2 mg Oral PRN Jadapalle, Sree, MD   2 mg at 06/19/24 1457    Lab Results: No results found for this or any previous visit (from the past 48 hours).  Blood Alcohol level:  Lab Results  Component Value Date   [facility] <15 06/15/2024    Metabolic Disorder Labs: Lab Results  Component Value Date   HGBA1C 5.9 (H) 05/08/2023   MPG 123 05/08/2023   MPG 123 10/08/2021   Lab Results  Component Value Date   PROLACTIN 11.8 04/24/2021   Lab Results  Component Value Date   CHOL 143 05/08/2023   TRIG 52 05/08/2023   HDL 40 (L) 05/08/2023   CHOLHDL 3.6 05/08/2023   VLDL 10 05/08/2023   LDLCALC 93 05/08/2023       Psychiatric Specialty Exam: Appearance: Appropriately dressed, hair washed, improved grooming Eye Contact: Appropriate Behavior: Calm, cooperative, engaged Mood: Euthymic Affect: calm Thought Process: Linear, goal-directed Thought Content: No SI, HI, hallucinations, paranoia, or delusions Speech: Normal rate and tone Orientation: Fully oriented Insight: Improving Judgment: Improving Sleep:  Improved Appetite: Stable   Physical Exam: Physical Exam ROS Blood pressure 126/72, pulse 72, temperature 97.7 F (36.5 C), resp. rate 18, height 5' (1.524 m), weight 58.5 kg, SpO2 98%. Body mass index is 25.19 kg/m.  Diagnosis: Principal Problem:   Bipolar 2 disorder (HCC)   PLAN: Safety and Monitoring:  -- Voluntary admission to inpatient psychiatric unit for safety, stabilization and treatment  -- Daily contact with patient to  assess and evaluate symptoms and progress in treatment  -- Patient's case to be discussed in multi-disciplinary team meeting  -- Observation Level : q15 minute checks  -- Vital signs:  q12 hours  -- Precautions: suicide, elopement, and assault -- Encouraged patient to participate in unit milieu and in scheduled group therapies   2. Psychiatric Diagnoses and Treatment:   Patient presentation meets criteria for mood disorder unspecified in the setting of acute suicidal behavior, methamphetamine relapse, and severe psychosocial stressors, including marital separation, loss, and trauma history. While she currently denies suicidal intent, she has limited insight, externalizes blame, and remains emotionally volatile and reactive.She is currently under IVC petition and meets criteria for continued inpatient psychiatric treatment to ensure safety, support stabilization, and evaluate for medication management.   Patient continues to show improvement in mood stability, insight, and judgment. Engaged appropriately with peers and staff, demonstrating improved self-care and hygiene. Continues to deny SI/HI or psychotic symptoms. Motivated for outpatient follow-up and reports strong family support. Patient plan for discharge tomorrow, will remain in this setting overnight until discharge disposition needs fully met, remains appropriate for this acute inpatient psychiatric setting for safety, medication management, and symptom stabilization in the meantime. She has been  monitored in this setting and consistently denied SI/HI with no self harm or aggressive behaviors noted.   She has declined any medication management of symptoms related to not wanting to add substances to her body due to history of significant drug abuse. Continue current treatment plan with focus on maintaining mood stability and relapse prevention. Will discontinue doxepin . Patient requested Ativan  for sleep; advised this would not be possible and discussed alternative options, which she declined. Continue supportive therapy and group participation. Encourage continued insight building and discharge planning.  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 Medical Issues Being Addressed: medically cleared in ED     4. Discharge Planning:   -- Social work and case management to assist with discharge planning and identification of hospital follow-up needs prior to discharge  -- Estimated LOS: 3-4 days  Hoy CHRISTELLA Pinal, NP 06/19/2024, 3:29 PM

## 2024-06-19 NOTE — Plan of Care (Signed)
   Problem: Education: Goal: Knowledge of [facility] Education information/materials will improve Outcome: Progressing Goal: Emotional status will improve Outcome: Progressing Goal: Mental status will improve Outcome: Progressing Goal: Verbalization of understanding the information provided will improve Outcome: Progressing

## 2024-06-19 NOTE — Group Note (Signed)
 Date:  06/19/2024 Time:  4:58 PM  Group Topic/Focus:  Activity Group: The focus of the group is to promote activity for the patients and to encourage them to go outside to the courtyard for some fresh air and some exercise.    Participation Level:  Active  Participation Quality:  Appropriate  Affect:  Appropriate  Cognitive:  Appropriate  Insight: Appropriate  Engagement in Group:  Engaged  Modes of Intervention:  Activity  Additional Comments:    Camellia HERO Roben Tatsch 06/19/2024, 4:58 PM

## 2024-06-19 NOTE — Progress Notes (Signed)
 Pt continues to improve emotionally and states she is looking forward to discharge tomorrow.  Pt received nicotine  replacement therapy PRN.  Pt denies SI HI AVH.  Continued q15 m monitoring for safety.    06/19/24 1700  Psychosocial Assessment  Patient Complaints Anxiety  Eye Contact Fair  Facial Expression Anxious  Affect Appropriate to circumstance  Speech Logical/coherent  Interaction Assertive  Motor Activity Restless  Appearance/Hygiene Unremarkable  Behavior Characteristics Anxious  Mood Anxious  Thought Process  Coherency WDL  Content WDL  Delusions None reported or observed  Perception WDL  Hallucination None reported or observed  Judgment Poor  Confusion None  Danger to Self  Current suicidal ideation? Denies  Danger to Others  Danger to Others None reported or observed

## 2024-06-19 NOTE — Group Note (Signed)
 Date:  06/19/2024 Time:  9:27 PM  Group Topic/Focus:  Wrap-Up Group:   The focus of this group is to help patients review their daily goal of treatment and discuss progress on daily workbooks.    Participation Level:  Active  Participation Quality:  Appropriate and Attentive  Affect:  Appropriate  Cognitive:  Alert and Appropriate  Insight: Appropriate  Engagement in Group:  Engaged  Modes of Intervention:  Discussion and Orientation  Additional Comments:     Maglione,Yeraldy Spike E 06/19/2024, 9:27 PM

## 2024-06-20 DIAGNOSIS — F3181 Bipolar II disorder: Secondary | ICD-10-CM | POA: Diagnosis not present

## 2024-06-20 LAB — LIPID PANEL
Cholesterol: 143 mg/dL (ref 0–200)
HDL: 49 mg/dL (ref >40)
LDL Cholesterol: 80 mg/dL (ref 0–99)
Total CHOL/HDL Ratio: 2.9 ratio
Triglycerides: 72 mg/dL (ref <150)
VLDL: 14 mg/dL (ref 0–40)
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
## 2024-06-20 NOTE — Group Note (Signed)
 Date:  06/20/2024 Time:  10:20 AM  Group Topic/Focus:  Goals Group:   The focus of this group is to help patients establish daily goals to achieve during treatment and discuss how the patient can incorporate goal setting into their daily lives to aide in recovery.    Participation Level:  Did Not Attend    Amy Gomez 06/20/2024, 10:20 AM

## 2024-06-20 NOTE — Progress Notes (Signed)
 Pt visible and engaging. States, I am good; I am ready for discharge.   06/19/24 2200  Psych Admission Type (Psych Patients Only)  Admission Status Voluntary  Psychosocial Assessment  Patient Complaints None  Eye Contact Fair  Facial Expression Animated  Affect Appropriate to circumstance  Speech Logical/coherent  Interaction Assertive  Motor Activity Restless  Appearance/Hygiene Unremarkable  Behavior Characteristics Appropriate to situation;Cooperative  Mood Pleasant  Thought Process  Coherency WDL  Content WDL  Delusions None reported or observed  Perception WDL  Hallucination None reported or observed  Judgment Limited  Confusion None  Danger to Self  Current suicidal ideation? Denies  Agreement Not to Harm Self Yes  Description of Agreement verbal  Danger to Others  Danger to Others None reported or observed    Problem: Education: Goal: Knowledge of [facility] Education information/materials will improve Outcome: Progressing Goal: Emotional status will improve Outcome: Progressing Goal: Mental status will improve Outcome: Progressing Goal: Verbalization of understanding the information provided will improve Outcome: Progressing   Problem: Activity: Goal: Interest or engagement in activities will improve Outcome: Progressing Goal: Sleeping patterns will improve Outcome: Progressing   Problem: Coping: Goal: Ability to verbalize frustrations and anger appropriately will improve Outcome: Progressing Goal: Ability to demonstrate self-control will improve Outcome: Progressing

## 2024-06-20 NOTE — Progress Notes (Signed)
 Patient pleasant and cooperative on approach. Denies SI,HI and AVH. Verbalized understanding discharge instructions,prescriptions and follow up care.  All belongings returned from [employer]. Suicide safety plan filled by patient and placed in chart. Copy given to patient.Patient escorted out by staff and transported by family.

## 2024-06-20 NOTE — Progress Notes (Addendum)
  [facility] Adult Case Management Discharge Plan :  Will you be returning to the same living situation after discharge:  Yes,  Patient to return home.  At discharge, do you have transportation home?: Yes,  Patient's daughter to return home.  Do you have the ability to pay for your medications: Yes,  [employer]  Release of information consent forms completed and in the chart;  Patient's signature needed at discharge.  Patient to Follow up at:  Follow-up Information     [facility]. Go to.   Why: Virtual assessment for therapy and medication management is 06/28/24 at 8:30 AM. Contact information: [address]  [address] [place] [address] [phone]                 Next level of care provider has access to [facility] Link:no  Safety Planning and Suicide Prevention discussed: Yes,  Education Completed; Cienna Dumais, daughter, [phone], has been identified by the patient as the family member/significant other with whom the patient will be residing, and identified as the person(s) who will aid the patient in the event of a mental health crisis (suicidal ideations/suicide attempt).  With written consent from the patient, the family member/significant other has been provided the following suicide prevention education, prior to the and/or following the discharge of the patient.     Has patient been referred to the Quitline?: Patient refused referral for treatment  Patient has been referred for addiction treatment: Patient refused referral for treatment; referral information given to patient at discharge.  Alveta CHRISTELLA Kerns, LCSW 06/20/2024, 10:43 AM

## 2024-06-21 ENCOUNTER — Ambulatory Visit: Payer: Self-pay | Admitting: Internal Medicine

## 2024-06-21 LAB — HEMOGLOBIN A1C
Hgb A1c MFr Bld: 5.7 % — ABNORMAL HIGH (ref 4.8–5.6)
Mean Plasma Glucose: 117 mg/dL

## 2024-06-27 ENCOUNTER — Encounter: Payer: Self-pay | Admitting: Internal Medicine

## 2024-06-27 ENCOUNTER — Ambulatory Visit: Payer: MEDICAID | Admitting: Internal Medicine

## 2024-06-29 ENCOUNTER — Ambulatory Visit: Payer: MEDICAID | Admitting: Internal Medicine

## 2024-06-29 ENCOUNTER — Encounter: Payer: Self-pay | Admitting: Internal Medicine

## 2024-07-08 ENCOUNTER — Encounter: Payer: Self-pay | Admitting: Internal Medicine

## 2024-08-05 ENCOUNTER — Ambulatory Visit: Payer: Self-pay

## 2024-08-05 NOTE — Telephone Encounter (Signed)
 Tried to call patient. Will also send my chart message to patient. I was going to schedule her for an appt today. Hoping to get a response from patient soon.  - Amy Gomez M
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
## 2024-08-10 ENCOUNTER — Encounter: Payer: MEDICAID | Admitting: Internal Medicine

## 2024-08-10 ENCOUNTER — Encounter: Payer: Self-pay | Admitting: Internal Medicine

## 2024-08-11 NOTE — Progress Notes (Signed)
 Error

## 2024-08-23 ENCOUNTER — Ambulatory Visit: Payer: Self-pay | Admitting: Internal Medicine

## 2024-09-14 ENCOUNTER — Ambulatory Visit: Payer: Self-pay

## 2024-09-14 NOTE — Telephone Encounter (Signed)
 Please see note - advised she will be contacted if requires an appointment.   FYI Only or Action Required?: Action required by provider: medication refill request and clinical question for provider.  Patient was last seen in primary care on 03/14/2024 by Justus Leita DEL, MD.  Called Nurse Triage reporting Migraine.  Symptoms began today.  Interventions attempted: OTC medications: ibuprofen .  Symptoms are: gradually worsening.  Triage Disposition: See HCP Within 4 Hours (Or PCP Triage)  Patient/caregiver understands and will follow disposition?: No, refuses disposition Reason for Disposition  [1] SEVERE headache (e.g., excruciating) AND [2] not improved after 2 hours of pain medicine  Answer Assessment - Initial Assessment Questions Patient reports facial pain that radiates, states that it is exactly the same symptoms that she experiences when she has a migraine. Patient states that she cannot come in for an appointment. States her migraines occur monthly. Requesting refill of Tramadol .   **See recent ED note  1. LOCATION: Where does it hurt?      See above  2. ONSET: When did the headache start? (e.g., minutes, hours, days)      This morning  3. PATTERN: Does the pain come and go, or has it been constant since it started?     Constant  4. SEVERITY: How bad is the pain? and What does it keep you from doing?  (e.g., Scale 1-10; mild, moderate, or severe)     Severe  5. RECURRENT SYMPTOM: Have you ever had headaches before? If Yes, ask: When was the last time? and What happened that time?      Yes, hx of migraines. States gets approx 1-3 biweekly  6. CAUSE: What do you think is causing the headache?     Migraine  Protocols used: [facility] Copied from CRM [id number]. Topic: Clinical - Red Word Triage >> Sep 14, 2024  2:44 PM Myrick T wrote: Red Word that prompted transfer to Nurse Triage: patient said she is extremely tired, she has pressure in between  her eyes along her nose, down her neck down her head and she feels like the onset of a migraine. She needs her migraine medication but has no insurance

## 2024-09-22 ENCOUNTER — Ambulatory Visit: Payer: Self-pay | Admitting: Internal Medicine

## 2024-09-26 ENCOUNTER — Ambulatory Visit: Payer: Self-pay | Admitting: Internal Medicine
